# Patient Record
Sex: Female | Born: 1950 | Race: Black or African American | Hispanic: No | Smoking: Never smoker
Health system: Southern US, Community
[De-identification: ages and names within clinical notes are randomized; demographics above are authoritative.]

## PROBLEM LIST (undated history)

## (undated) DIAGNOSIS — I2699 Other pulmonary embolism without acute cor pulmonale: Secondary | ICD-10-CM

## (undated) DIAGNOSIS — K922 Gastrointestinal hemorrhage, unspecified: Secondary | ICD-10-CM

## (undated) DIAGNOSIS — C169 Malignant neoplasm of stomach, unspecified: Secondary | ICD-10-CM

## (undated) DIAGNOSIS — E875 Hyperkalemia: Secondary | ICD-10-CM

## (undated) DIAGNOSIS — I96 Gangrene, not elsewhere classified: Secondary | ICD-10-CM

## (undated) DIAGNOSIS — D649 Anemia, unspecified: Secondary | ICD-10-CM

## (undated) DIAGNOSIS — D696 Thrombocytopenia, unspecified: Secondary | ICD-10-CM

## (undated) DIAGNOSIS — I82409 Acute embolism and thrombosis of unspecified deep veins of unspecified lower extremity: Secondary | ICD-10-CM

## (undated) DIAGNOSIS — E871 Hypo-osmolality and hyponatremia: Secondary | ICD-10-CM

## (undated) HISTORY — DX: Malignant neoplasm of stomach, unspecified: C16.9

## (undated) HISTORY — PX: COLONOSCOPY W/ POLYPECTOMY: SHX1380

## (undated) HISTORY — DX: Anemia, unspecified: D64.9

---

## 2006-10-04 ENCOUNTER — Encounter (INDEPENDENT_AMBULATORY_CARE_PROVIDER_SITE_OTHER): Payer: Self-pay | Admitting: Obstetrics and Gynecology

## 2006-10-04 ENCOUNTER — Ambulatory Visit (HOSPITAL_COMMUNITY): Admission: RE | Admit: 2006-10-04 | Discharge: 2006-10-04 | Payer: Self-pay | Admitting: Obstetrics and Gynecology

## 2008-10-29 ENCOUNTER — Encounter: Payer: Self-pay | Admitting: Gynecologic Oncology

## 2008-10-29 ENCOUNTER — Ambulatory Visit: Admission: RE | Admit: 2008-10-29 | Discharge: 2008-10-29 | Payer: Self-pay | Admitting: Gynecologic Oncology

## 2010-03-20 HISTORY — PX: CERVICAL CONIZATION W/BX: SHX1330

## 2010-06-29 ENCOUNTER — Other Ambulatory Visit: Payer: Self-pay | Admitting: Obstetrics and Gynecology

## 2010-08-02 NOTE — Consult Note (Signed)
NAME:  Jodi Collins, Jodi Collins      ACCOUNT NO.:  [id number]   MEDICAL RECORD NO.:  [id number]          PATIENT TYPE:  OUT   LOCATION:  GYN                          FACILITY:  [facility]   PHYSICIAN:  Laurette Schimke, MD     DATE OF BIRTH:  18-Jul-1950   DATE OF CONSULTATION:  10/29/2008  DATE OF DISCHARGE:                                 CONSULTATION   REQUESTING PHYSICIAN:  Dr. Cherly Hensen.   This is a consult requested from Dr. Cherly Hensen for low-grade SIL.   HISTORY OF PRESENT ILLNESS:  This is a 60 year old nulliparous female  who reported her first abnormal Pap test in 2008 that was treated with  cervical conization and subsequent Paps were all negative until a Pap of  09/29/2008 was reported with low-grade SIL with a comment that there are  rare cells suggestive of higher grade dysplasia.  She denies any post  coital bleeding, changes in bowel, rectal or bladder habits, pelvic  fullness or pelvic pain.   PAST MEDICAL HISTORY:  None.   PAST SURGICAL HISTORY:  Cold knife cone in 2008.   PAST GYN HISTORY:  Nulliparous.  Menarche occurred at the age of 24,  with menses occurring every month until menopause at age 81.  She has  been married for 10 years, is employed as a Librarian, academic.   MEDICATIONS:  Rare use of multivitamins.   SCREENING HISTORY:  Mammogram in March 2010, within normal limits and  colonoscopy at the age of 76 was within normal limits.   PHYSICAL EXAMINATION:  This is a well-developed female in no acute  distress.  ABDOMEN:  Soft and nontender.  LYMPH NODE SURVEY:  Reveals no cervical, inguinal or supraclavicular  adenopathy.  There is normal external genitalia, Bartholin, urethral and Skene.  VAGINA:  Markedly atrophic.  The cervix is flushed with the vaginal fold  and only a dimple can be appreciated.  On colposcopic examination no  lesions were noted within the vagina or the cervical portion.  However,  the vagina was excessively friable.  This was consistent  with atrophic  vaginitis.  The squamocolumnar junction was not visualized and an ECC  was obtained with the use of a cervical brush.  PELVIC EXAMINATION:  There was no parametrial fullness, thickening.  A  small uterus was appreciated.   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  An endocervical curettage was obtained.  If  this is normal there are two options, the first of which is priming the  vagina with estrogen for a series of months and repeating the  Papanicolaou and the colposcopy in addition to collection of human  papilloma virus.  If the Paps continue to return, after vaginal priming,  with evidence of suggestion of dysplasia,  given the small/absent cervix, I do not believe that repeat conization  would be feasible and at that point hysterectomy would need to be  performed.  I have told Ms. Clarke-Campbell that I will contact her with  the results of the endocervical curettage within the next two weeks.      Laurette Schimke, MD  Electronically Signed  WB/MEDQ  D:  10/29/2008  T:  10/29/2008  Job:  [id number]   cc:   Telford Nab, R.N.  [address]  [place], [place] [address]   Maxie Better, M.D.  Fax: [phone]

## 2010-08-02 NOTE — Op Note (Signed)
NAME:  Jodi Collins, Jodi Collins      ACCOUNT NO.:  [id number]   MEDICAL RECORD NO.:  [id number]          PATIENT TYPE:  AMB   LOCATION:  SDC                           FACILITY:  [facility]   PHYSICIAN:  Maxie Better, M.D.DATE OF BIRTH:  24-Oct-1950   DATE OF PROCEDURE:  10/04/2006  DATE OF DISCHARGE:                               OPERATIVE REPORT   PREOPERATIVE DIAGNOSIS:  High-grade squamous intraepithelial lesion.   POSTOPERATIVE DIAGNOSIS:  High-grade squamous intraepithelial lesion  pending final pathology.   OPERATIONS:  Cold knife conization, D and C.   SURGEON:  Maxie Better, M.D.   ANESTHESIA:  General.   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  The  patient now presents for surgical evaluation.  Risks and benefits of the  procedure have been explained.  The patient has been consented and  transferred to the operating room.   DESCRIPTION OF PROCEDURE:  Under adequate general anesthesia, the  patient was placed in the dorsal lithotomy position.  She had been  catheterized for a small amount of urine.  The patient was inadvertently  prepped in the vagina by the surgical tech.  A bivalve speculum was  placed in the vagina.  The cervix was partially flushed to the vaginal  wall.  The vagina was very atrophic.  Stay sutures were placed at 3 and  9 o'clock position using 0 Vicryl figure-of-eight stitches and utilized  for traction on the cervix.  Lugol's solution was placed on the cervix.  Dilute solution of Pitressin was injected circumferentially in the  cervix.  Using a scalpel, a circumferential incision was then made  around the cervical os and the Mayo scissors were then used to complete  removal of a cone shaped specimen.  The specimen  was tagged at the 12  o'clock position.  The uterus sounded to about 6 cm.  The internal os  was deviated to the right.  Careful curetting was then performed.  The  procedure was then continued with fulguration of the base of the cone  biopsy and Surgicel placed in the bed.  Sutures were then tied in the  midline.  All instruments were then removed from the vagina.  Specimen  was cone knife biopsy and endometrial curetting.  Estimated blood loss  is minimal.  Complication was none.  The patient tolerated the procedure  well and was transferred to the recovery room in stable condition.      Maxie Better, M.D.  Electronically Signed     [place]/MEDQ  D:  10/04/2006  T:  10/05/2006  Job:  [id number]

## 2011-01-02 LAB — CBC
MCHC: 32.6
RBC: 4.72
RDW: 14.1 — ABNORMAL HIGH

## 2016-08-10 ENCOUNTER — Other Ambulatory Visit: Payer: Self-pay | Admitting: Internal Medicine

## 2016-08-10 DIAGNOSIS — M722 Plantar fascial fibromatosis: Secondary | ICD-10-CM

## 2016-08-17 ENCOUNTER — Other Ambulatory Visit: Payer: Self-pay | Admitting: Internal Medicine

## 2016-08-17 ENCOUNTER — Ambulatory Visit
Admission: RE | Admit: 2016-08-17 | Discharge: 2016-08-17 | Disposition: A | Discharge status: Home / Self Care | Payer: 59 | Source: Ambulatory Visit | Attending: Internal Medicine | Admitting: Internal Medicine

## 2016-08-17 DIAGNOSIS — I82409 Acute embolism and thrombosis of unspecified deep veins of unspecified lower extremity: Secondary | ICD-10-CM

## 2016-08-17 DIAGNOSIS — M722 Plantar fascial fibromatosis: Secondary | ICD-10-CM

## 2016-08-17 HISTORY — DX: Acute embolism and thrombosis of unspecified deep veins of unspecified lower extremity: I82.409

## 2016-08-28 ENCOUNTER — Encounter (HOSPITAL_COMMUNITY): Payer: Self-pay | Admitting: Emergency Medicine

## 2016-08-28 ENCOUNTER — Emergency Department (HOSPITAL_COMMUNITY): Payer: 59

## 2016-08-28 ENCOUNTER — Inpatient Hospital Stay (HOSPITAL_COMMUNITY)
Admission: EM | Admit: 2016-08-28 | Discharge: 2016-09-01 | DRG: 166 | Disposition: A | Discharge status: Home / Self Care | Payer: 59 | Attending: Internal Medicine | Admitting: Internal Medicine

## 2016-08-28 DIAGNOSIS — N179 Acute kidney failure, unspecified: Secondary | ICD-10-CM | POA: Diagnosis present

## 2016-08-28 DIAGNOSIS — D649 Anemia, unspecified: Secondary | ICD-10-CM | POA: Diagnosis present

## 2016-08-28 DIAGNOSIS — K229 Disease of esophagus, unspecified: Secondary | ICD-10-CM | POA: Diagnosis not present

## 2016-08-28 DIAGNOSIS — D62 Acute posthemorrhagic anemia: Secondary | ICD-10-CM

## 2016-08-28 DIAGNOSIS — I82402 Acute embolism and thrombosis of unspecified deep veins of left lower extremity: Secondary | ICD-10-CM

## 2016-08-28 DIAGNOSIS — K921 Melena: Secondary | ICD-10-CM | POA: Diagnosis present

## 2016-08-28 DIAGNOSIS — Z452 Encounter for adjustment and management of vascular access device: Secondary | ICD-10-CM

## 2016-08-28 DIAGNOSIS — C169 Malignant neoplasm of stomach, unspecified: Secondary | ICD-10-CM | POA: Diagnosis present

## 2016-08-28 DIAGNOSIS — I2692 Saddle embolus of pulmonary artery without acute cor pulmonale: Secondary | ICD-10-CM | POA: Diagnosis not present

## 2016-08-28 DIAGNOSIS — C799 Secondary malignant neoplasm of unspecified site: Secondary | ICD-10-CM

## 2016-08-28 DIAGNOSIS — I2699 Other pulmonary embolism without acute cor pulmonale: Secondary | ICD-10-CM

## 2016-08-28 DIAGNOSIS — I82422 Acute embolism and thrombosis of left iliac vein: Secondary | ICD-10-CM

## 2016-08-28 DIAGNOSIS — C762 Malignant neoplasm of abdomen: Secondary | ICD-10-CM | POA: Diagnosis not present

## 2016-08-28 DIAGNOSIS — R0902 Hypoxemia: Secondary | ICD-10-CM | POA: Diagnosis not present

## 2016-08-28 DIAGNOSIS — K922 Gastrointestinal hemorrhage, unspecified: Secondary | ICD-10-CM | POA: Diagnosis not present

## 2016-08-28 DIAGNOSIS — R1900 Intra-abdominal and pelvic swelling, mass and lump, unspecified site: Secondary | ICD-10-CM | POA: Diagnosis not present

## 2016-08-28 DIAGNOSIS — J9601 Acute respiratory failure with hypoxia: Secondary | ICD-10-CM | POA: Diagnosis present

## 2016-08-28 DIAGNOSIS — K449 Diaphragmatic hernia without obstruction or gangrene: Secondary | ICD-10-CM | POA: Diagnosis present

## 2016-08-28 DIAGNOSIS — C786 Secondary malignant neoplasm of retroperitoneum and peritoneum: Secondary | ICD-10-CM | POA: Diagnosis not present

## 2016-08-28 DIAGNOSIS — R0602 Shortness of breath: Secondary | ICD-10-CM

## 2016-08-28 DIAGNOSIS — C7982 Secondary malignant neoplasm of genital organs: Secondary | ICD-10-CM | POA: Diagnosis not present

## 2016-08-28 DIAGNOSIS — R000 Tachycardia, unspecified: Secondary | ICD-10-CM

## 2016-08-28 DIAGNOSIS — O2230 Deep phlebothrombosis in pregnancy, unspecified trimester: Secondary | ICD-10-CM

## 2016-08-28 DIAGNOSIS — D72829 Elevated white blood cell count, unspecified: Secondary | ICD-10-CM

## 2016-08-28 HISTORY — DX: Other pulmonary embolism without acute cor pulmonale: I26.99

## 2016-08-28 HISTORY — DX: Acute embolism and thrombosis of unspecified deep veins of unspecified lower extremity: I82.409

## 2016-08-28 LAB — RETICULOCYTES
RBC.: 1.79 MIL/uL — ABNORMAL LOW (ref 3.87–5.11)
Retic Count, Absolute: 230.9 10*3/uL — ABNORMAL HIGH (ref 19.0–186.0)
Retic Ct Pct: 12.9 % — ABNORMAL HIGH (ref 0.4–3.1)

## 2016-08-28 LAB — COMPREHENSIVE METABOLIC PANEL
ALBUMIN: 2.6 g/dL — AB (ref 3.5–5.0)
ALK PHOS: 77 U/L (ref 38–126)
ALT: 32 U/L (ref 14–54)
ANION GAP: 16 — AB (ref 5–15)
AST: 54 U/L — ABNORMAL HIGH (ref 15–41)
BUN: 21 mg/dL — ABNORMAL HIGH (ref 6–20)
CHLORIDE: 106 mmol/L (ref 101–111)
CO2: 16 mmol/L — AB (ref 22–32)
CREATININE: 1.14 mg/dL — AB (ref 0.44–1.00)
Calcium: 8.3 mg/dL — ABNORMAL LOW (ref 8.9–10.3)
GFR calc non Af Amer: 50 mL/min — ABNORMAL LOW (ref >60)
GFR, EST AFRICAN AMERICAN: 58 mL/min — AB (ref >60)
GLUCOSE: 148 mg/dL — AB (ref 65–99)
Potassium: 4.4 mmol/L (ref 3.5–5.1)
SODIUM: 138 mmol/L (ref 135–145)
Total Bilirubin: 0.6 mg/dL (ref 0.3–1.2)
Total Protein: 7.1 g/dL (ref 6.5–8.1)

## 2016-08-28 LAB — CBC WITH DIFFERENTIAL/PLATELET
Basophils Absolute: 0 10*3/uL (ref 0.0–0.1)
Basophils Relative: 0 %
Eosinophils Absolute: 0 10*3/uL (ref 0.0–0.7)
Eosinophils Relative: 0 %
HEMATOCRIT: 15.5 % — AB (ref 36.0–46.0)
HEMOGLOBIN: 4.4 g/dL — AB (ref 12.0–15.0)
LYMPHS ABS: 1.6 10*3/uL (ref 0.7–4.0)
Lymphocytes Relative: 12 %
MCH: 23.9 pg — AB (ref 26.0–34.0)
MCHC: 28.4 g/dL — ABNORMAL LOW (ref 30.0–36.0)
MCV: 84.2 fL (ref 78.0–100.0)
MONO ABS: 0.4 10*3/uL (ref 0.1–1.0)
Monocytes Relative: 3 %
NEUTROS ABS: 11.1 10*3/uL — AB (ref 1.7–7.7)
NEUTROS PCT: 84 %
Platelets: 180 10*3/uL (ref 150–400)
RBC: 1.84 MIL/uL — ABNORMAL LOW (ref 3.87–5.11)
RDW: 21.8 % — AB (ref 11.5–15.5)
WBC: 13.2 10*3/uL — ABNORMAL HIGH (ref 4.0–10.5)

## 2016-08-28 LAB — IRON AND TIBC
IRON: 16 ug/dL — AB (ref 28–170)
Saturation Ratios: 6 % — ABNORMAL LOW (ref 10.4–31.8)
TIBC: 262 ug/dL (ref 250–450)
UIBC: 246 ug/dL

## 2016-08-28 LAB — BRAIN NATRIURETIC PEPTIDE: B Natriuretic Peptide: 347 pg/mL — ABNORMAL HIGH (ref 0.0–100.0)

## 2016-08-28 LAB — HEPARIN LEVEL (UNFRACTIONATED): Heparin Unfractionated: <0.1 IU/mL — ABNORMAL LOW (ref 0.30–0.70)

## 2016-08-28 LAB — PREPARE RBC (CROSSMATCH)

## 2016-08-28 LAB — VITAMIN B12: VITAMIN B 12: 5758 pg/mL — AB (ref 180–914)

## 2016-08-28 LAB — FOLATE: FOLATE: 18.4 ng/mL (ref >5.9)

## 2016-08-28 LAB — ABO/RH: ABO/RH(D): A POS

## 2016-08-28 LAB — PROTIME-INR
INR: 1.5
Prothrombin Time: 18.3 seconds — ABNORMAL HIGH (ref 11.4–15.2)

## 2016-08-28 LAB — FERRITIN: Ferritin: 26 ng/mL (ref 11–307)

## 2016-08-28 LAB — APTT: APTT: 28 s (ref 24–36)

## 2016-08-28 LAB — TROPONIN I: Troponin I: 0.22 ng/mL (ref <0.03)

## 2016-08-28 MED ORDER — IOPAMIDOL (ISOVUE-370) INJECTION 76%
100.0000 mL | Freq: Once | INTRAVENOUS | Status: AC | PRN
  Administered 2016-08-28: 100 mL via INTRAVENOUS

## 2016-08-28 MED ORDER — ASPIRIN 300 MG RE SUPP: 300.0000 mg | RECTAL | Status: DC

## 2016-08-28 MED ORDER — ASPIRIN 81 MG PO CHEW: 324.0000 mg | CHEWABLE_TABLET | ORAL | Status: DC

## 2016-08-28 MED ORDER — SODIUM CHLORIDE 0.9 % IV SOLN: 250.0000 mL | INTRAVENOUS | Status: DC | PRN

## 2016-08-28 MED ORDER — PANTOPRAZOLE SODIUM 40 MG IV SOLR
40.0000 mg | INTRAVENOUS | Status: DC
  Administered 2016-08-29 – 2016-09-01 (×4): 40 mg via INTRAVENOUS
  Filled 2016-08-28 (×4): qty 40

## 2016-08-28 MED ORDER — HEPARIN BOLUS VIA INFUSION: 3500.0000 [IU] | Freq: Once | INTRAVENOUS | Status: DC

## 2016-08-28 MED ORDER — SODIUM CHLORIDE 0.9 % IV SOLN
Freq: Once | INTRAVENOUS | Status: AC
  Administered 2016-08-28: 10 mL/h via INTRAVENOUS

## 2016-08-28 MED ORDER — HEPARIN (PORCINE) IN NACL 100-0.45 UNIT/ML-% IJ SOLN: 1000.0000 [IU]/h | INTRAMUSCULAR | Status: DC

## 2016-08-28 NOTE — Progress Notes (Signed)
ANTICOAGULATION CONSULT NOTE - Initial Consult  Pharmacy Consult for Heparin Indication: pulmonary embolus  No Known Allergies  Patient Measurements: Height: 5\' 2"  (157.5 cm) Weight: 130 lb (59 kg) IBW/kg (Calculated) : 50.1 HEPARIN DW (KG): 59  Vital Signs: Temp: 98 F (36.7 C) (06/11 1459) Temp Source: Oral (06/11 1459) BP: 134/79 (06/11 1630) Pulse Rate: 105 (06/11 1615)  Labs:  Recent Labs  08/28/16 1547  LABPROT 18.3*  INR 1.50  CREATININE 1.14*  TROPONINI 0.22*    Estimated Creatinine Clearance: 39.4 mL/min (A) (by C-G formula based on SCr of 1.14 mg/dL (H)).   Medical History: Past Medical History:  Diagnosis Date  . DVT (deep venous thrombosis) (HCC)     Medications:  See med rec  Assessment: 66 yo female presents to ED with SOB. It started a couple of days ago and is gradually worsening. She also complains of a cough, leg pain, and leg swelling. She does have a DVT that was diagnosed 2 weeks ago, but stopped the xarelto last Friday. Likely patient is presenting with a PE, plan to start heparin.  Goal of Therapy:  Heparin level 0.3-0.7 units/ml Monitor platelets by anticoagulation protocol: Yes   Plan:  Give 3500 units bolus x 1 Start heparin infusion at 1000 units/hr Check anti-Xa level in 6 hours and daily while on heparin Continue to monitor H&H and platelets  Trenton Gammon, Damika Harmon L 08/28/2016,4:41 PM

## 2016-08-28 NOTE — ED Triage Notes (Signed)
PT states she was dx with DVT to LLE x1 week ago. PT states SOB on exertion since she began taking Xarelto medication. PT denies any pain at this time.

## 2016-08-28 NOTE — ED Notes (Signed)
Pt is in CT

## 2016-08-28 NOTE — H&P (Signed)
PULMONARY / CRITICAL CARE MEDICINE   Name: Jodi Collins MRN: [id number] DOB: 05-Mar-1951    ADMISSION DATE:  08/28/2016 CONSULTATION DATE:  08/28/2016  REFERRING MD:  Dr. Dayna Barker EDP  CHIEF COMPLAINT:  Dyspnea  HISTORY OF PRESENT ILLNESS:   66 year old female with PMH as below, which is significant for recent dx left DVT started on Xarelto after a long flight. She presented to [facility] ED 6/11 with complaints of SOB x several days with associated cough, leg pain, and leg swelling. Upon arrival to the ED she was markedly dyspneic despite the application of 4L O2 via [place]. There was obviously concern for PE and she was started on heparin. CTA did demonstrate R sided pulmonary emboli, however, lab work resulted and was significant for hemoglobin of 4.4. Heparin infusion was stopped and she was set up for blood transfusion. Other significant lab workup includes CO2 16, creatinine 1.14, BMP 347, Troponin 0.22, WBC 13.2,  plt 180, INR 1.5. 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 She was transferred to [facility] for ICU admission. PCCM to admit.   Reports Stop Taking Xarelto after one week due to increase in dyspnea. Additionally, reports that for about the last week bowel movements have been almost black. States for the last few days has been dizzy and felt weak.   PAST MEDICAL HISTORY :  She  has a past medical history of DVT (deep venous thrombosis) ([place]).  PAST SURGICAL HISTORY: She  has no past surgical history on file.  No Known Allergies  No current facility-administered medications on file prior to encounter.    No current outpatient prescriptions on file prior to encounter.    FAMILY HISTORY:  Her has no family status information on file.    SOCIAL HISTORY: She  reports that she has never smoked. She has never used smokeless tobacco. She reports that she drinks alcohol. She reports that she  does not use drugs.  REVIEW OF SYSTEMS:   All negative; except for those that are bolded, which indicate positives.  Constitutional: weight loss, weight gain, night sweats, fevers, chills, fatigue, weakness.  HEENT: headaches, sore throat, sneezing, nasal congestion, post nasal drip, difficulty swallowing, tooth/dental problems, visual complaints, visual changes, ear aches. Neuro: difficulty with speech, weakness, numbness, ataxia. CV:  chest pain, orthopnea, PND, swelling in lower extremities, dizziness, palpitations, syncope.  Resp: cough, hemoptysis, dyspnea, wheezing. GI: heartburn, indigestion, abdominal pain, nausea, vomiting, diarrhea, constipation, change in bowel habits, loss of appetite, hematemesis, melena, hematochezia.  GU: dysuria, change in color of urine, urgency or frequency, flank pain, hematuria. MSK: joint pain or swelling, decreased range of motion. Psych: change in mood or affect, depression, anxiety, suicidal ideations, homicidal ideations. Skin: rash, itching, bruising.   SUBJECTIVE:  Denies pain, reports improvement in dyspnea   VITAL SIGNS: BP 94/66   Pulse (!) 116   Temp 98 F (36.7 C) (Oral)   Resp 19   Ht 5\' 2"  (1.575 m)   Wt 59 kg (130 lb)   SpO2 96%   BMI 23.78 kg/m   HEMODYNAMICS:    VENTILATOR SETTINGS:    INTAKE / OUTPUT: No intake/output data recorded.  PHYSICAL EXAMINATION: General:  Adult female, no distress  Neuro:  Alert, oriented, follows commands  HEENT:  Dry mucus membranes  Cardiovascular:  Tachy, no MRG  Lungs:  Clear breath sounds, no crackles/wheeze  Abdomen:  Soft, non-tender, active bowel sounds  Musculoskeletal:  -edema  Skin:  Warm, dry, intact   LABS:  BMET  Recent Labs Lab 08/28/16 1547  NA 138  K 4.4  CL 106  CO2 16*  BUN 21*  CREATININE 1.14*  GLUCOSE 148*    Electrolytes  Recent Labs Lab 08/28/16 1547  CALCIUM 8.3*    CBC  Recent Labs Lab 08/28/16 1547  WBC 13.2*  HGB 4.4*  HCT  15.5*  PLT 180    Coag's  Recent Labs Lab 08/28/16 1547 08/28/16 1642  APTT  --  28  INR 1.50  --     Sepsis Markers No results for input(s): LATICACIDVEN, PROCALCITON, O2SATVEN in the last 168 hours.  ABG No results for input(s): PHART, PCO2ART, PO2ART in the last 168 hours.  Liver Enzymes  Recent Labs Lab 08/28/16 1547  AST 54*  ALT 32  ALKPHOS 77  BILITOT 0.6  ALBUMIN 2.6*    Cardiac Enzymes  Recent Labs Lab 08/28/16 1547  TROPONINI 0.22*    Glucose No results for input(s): GLUCAP in the last 168 hours.  Imaging Ct Angio Chest W/cm &/or Wo Cm  Result Date: 08/28/2016 CLINICAL DATA:  66 year old female with a history of shortness of breath EXAM: CT ANGIOGRAPHY CHEST CT ABDOMEN AND PELVIS WITH CONTRAST TECHNIQUE: Multidetector CT imaging of the chest was performed using the standard protocol during bolus administration of intravenous contrast. Multiplanar CT image reconstructions and MIPs were obtained to evaluate the vascular anatomy. Multidetector CT imaging of the abdomen and pelvis was performed using the standard protocol during bolus administration of intravenous contrast. CONTRAST:  100 cc Isovue 370 COMPARISON:  None. FINDINGS: CTA CHEST FINDINGS Cardiovascular: Heart: No cardiomegaly. No pericardial fluid/thickening. No significant coronary calcifications. Right and left ventricles are somewhat compressed by the large left-sided diaphragmatic herniation. Estimated diameter of the left ventricle measures 20 mm with estimated diameter of the right ventricle measuring 43 mm. Aorta: Unremarkable course, caliber, contour of the thoracic aorta. No aneurysm or dissection flap. No periaortic fluid. Pulmonary arteries: Filling defects of the right-sided pulmonary arteries involving the segmental branches of the right lower lobe, right middle lobe, right upper lobe. No filling defects within left-sided pulmonary arteries identified. Mediastinum/Nodes: Mediastinal  structures somewhat compressed by the diaphragmatic hernia. Lymph nodes are present within the mediastinum, with the lowest peritracheal lymph nodes borderline enlarged. Lungs/Pleura: Right lung with no pleural effusion or confluent airspace disease. Atelectatic changes at the base of the left lung.  No pneumothorax. Small nodule within the right upper lobe (image 30 of series 6). Musculoskeletal: No displaced fracture. Degenerative changes of the spine. Review of the MIP images confirms the above findings. CT ABDOMEN and PELVIS FINDINGS Hepatobiliary: Rounded hypodense lesion of the inferior right liver measures 7 mm (image 22 of series 12) and 10 mm (image 26 of series 12). Unremarkable appearance the gallbladder. Pancreas: Unremarkable appearance of the pancreas. Spleen: Unremarkable appearance of spleen Adrenals/Urinary Tract: Unremarkable appearance the bilateral adrenal glands. No evidence of left-sided or right-sided hydronephrosis. No nephrolithiasis. No perinephric stranding. On the right there is an ill-defined hypodense/ hypoenhancing region at the posterior/ lateral cortex of the kidney measuring 10 mm. Stomach/Bowel: Herniation of the majority of the stomach into the left chest through diaphragmatic hernia. The splenic flexure is herniated into the left chest. No evidence of abnormally distended small bowel or colon. Vascular/Lymphatic: Extensive nodularity of the mesenteric fat associated with the stomach, splenic flexure, and the mesenteric fat herniated into the left chest. Nodular tissue associated with the  vasculature. There is circumferential soft tissue surrounding the left gastric artery after the origin from the celiac artery soft tissue essentially in cases the gastric artery just after the origin. Hazy infiltration of the mesenteric fat within the left upper quadrant. Enhancing peritoneal surfaces in the low abdomen/pelvis, involving nearly all the visualized at adnexa and the mesenteric fat  associated with small bowel. Small volume ascites in the abdomen/pelvis. Unremarkable course caliber and contour of the abdominal aorta. No significant atherosclerotic changes. Proximal femoral vasculature patent. Nonocclusive venous thrombus within the left iliac system at the confluence of internal iliac vein in the external iliac vein. Proximal femoral veins unremarkable. Reproductive: Enhancing soft tissue nodularity associated with the right and left adnexa, with the surfaces of the broad ligaments in case with enhancing soft tissue. Other: Enhancing soft tissue within the umbilical region, likely pathologic lymph node. Musculoskeletal: No displaced fracture. No significant degenerative changes of the hips. Review of the MIP images confirms the above findings. IMPRESSION: The study is positive for right-sided pulmonary emboli involving segmental and subsegmental branches. Assessing for heart strain is limited given the distortion secondary to left-sided diaphragmatic hernia. If there is concern for right-sided heart strain, correlation with cardiac ECHO may be useful. Left-sided diaphragmatic hernia, involving majority of the stomach, splenic flexure, and large portion of the associated mesentery. 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 Alternative site of a presumed malignancy of the left upper quadrant could be the splenic flexure. Correlation with endoscopy and possibly colonoscopy recommended. 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 Enhancing lesion within the umbilical region, likely metastatic involvement of lymph node. Small hypodense lesions of the right liver, too small  to characterize and possibly benign cysts. Correlation with any future PET-CT may be useful. Low-density lesion of the lateral cortex of the right kidney. Differential diagnosis includes kidney infarction, infection, or tumor implant. Small nodule of the right upper lobe measuring 4 mm. Correlation with future imaging recommended. Nonocclusive left iliac DVT. These results were called by telephone at the time of interpretation on 08/28/2016 at 5:52 pm to Dr. Merrily Pew , who verbally acknowledged these results. Signed, Dulcy Fanny. Earleen Newport, DO [facility] Radiology Electronically Signed   By: Corrie Mckusick D.O.   On: 08/28/2016 17:57   Ct Abdomen Pelvis W Contrast  Result Date: 08/28/2016 CLINICAL DATA:  66 year old female with a history of shortness of breath EXAM: CT ANGIOGRAPHY CHEST CT ABDOMEN AND PELVIS WITH CONTRAST TECHNIQUE: Multidetector CT imaging of the chest was performed using the standard protocol during bolus administration of intravenous contrast. Multiplanar CT image reconstructions and MIPs were obtained to evaluate the vascular anatomy. Multidetector CT imaging of the abdomen and pelvis was performed using the standard protocol during bolus administration of intravenous contrast. CONTRAST:  100 cc Isovue 370 COMPARISON:  None. FINDINGS: CTA CHEST FINDINGS Cardiovascular: Heart: No cardiomegaly. No pericardial fluid/thickening. No significant coronary calcifications. Right and left ventricles are somewhat compressed by the large left-sided diaphragmatic herniation. Estimated diameter of the left ventricle measures 20 mm with estimated diameter of the right ventricle measuring 43 mm. Aorta: Unremarkable course, caliber, contour of the thoracic aorta. No aneurysm or dissection flap. No periaortic fluid. Pulmonary arteries: Filling defects of the  right-sided pulmonary arteries involving the segmental branches of the right lower lobe, right middle lobe,  right upper lobe. No filling defects within left-sided pulmonary arteries identified. Mediastinum/Nodes: Mediastinal structures somewhat compressed by the diaphragmatic hernia. Lymph nodes are present within the mediastinum, with the lowest peritracheal lymph nodes borderline enlarged. Lungs/Pleura: Right lung with no pleural effusion or confluent airspace disease. Atelectatic changes at the base of the left lung.  No pneumothorax. Small nodule within the right upper lobe (image 30 of series 6). Musculoskeletal: No displaced fracture. Degenerative changes of the spine. Review of the MIP images confirms the above findings. CT ABDOMEN and PELVIS FINDINGS Hepatobiliary: Rounded hypodense lesion of the inferior right liver measures 7 mm (image 22 of series 12) and 10 mm (image 26 of series 12). Unremarkable appearance the gallbladder. Pancreas: Unremarkable appearance of the pancreas. Spleen: Unremarkable appearance of spleen Adrenals/Urinary Tract: Unremarkable appearance the bilateral adrenal glands. No evidence of left-sided or right-sided hydronephrosis. No nephrolithiasis. No perinephric stranding. On the right there is an ill-defined hypodense/ hypoenhancing region at the posterior/ lateral cortex of the kidney measuring 10 mm. Stomach/Bowel: Herniation of the majority of the stomach into the left chest through diaphragmatic hernia. The splenic flexure is herniated into the left chest. No evidence of abnormally distended small bowel or colon. Vascular/Lymphatic: Extensive nodularity of the mesenteric fat associated with the stomach, splenic flexure, and the mesenteric fat herniated into the left chest. Nodular tissue associated with the vasculature. There is circumferential soft tissue surrounding the left gastric artery after the origin from the celiac artery soft tissue essentially in cases the gastric artery just after the origin. Hazy infiltration of the mesenteric fat within the left upper quadrant. Enhancing  peritoneal surfaces in the low abdomen/pelvis, involving nearly all the visualized at adnexa and the mesenteric fat associated with small bowel. Small volume ascites in the abdomen/pelvis. Unremarkable course caliber and contour of the abdominal aorta. No significant atherosclerotic changes. Proximal femoral vasculature patent. Nonocclusive venous thrombus within the left iliac system at the confluence of internal iliac vein in the external iliac vein. Proximal femoral veins unremarkable. Reproductive: Enhancing soft tissue nodularity associated with the right and left adnexa, with the surfaces of the broad ligaments in case with enhancing soft tissue. Other: Enhancing soft tissue within the umbilical region, likely pathologic lymph node. Musculoskeletal: No displaced fracture. No significant degenerative changes of the hips. Review of the MIP images confirms the above findings. IMPRESSION: The study is positive for right-sided pulmonary emboli involving segmental and subsegmental branches. Assessing for heart strain is limited given the distortion secondary to left-sided diaphragmatic hernia. If there is concern for right-sided heart strain, correlation with cardiac ECHO may be useful. Left-sided diaphragmatic hernia, involving majority of the stomach, splenic flexure, and large portion of the associated mesentery. 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 Alternative site of a presumed malignancy of the left upper quadrant could be the splenic flexure. Correlation with endoscopy and possibly colonoscopy recommended. 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 Enhancing lesion within the  umbilical region, likely metastatic involvement of lymph node. Small hypodense lesions of the right liver, too small to characterize and possibly benign cysts. Correlation with any future PET-CT may be useful. Low-density lesion of the  lateral cortex of the right kidney. Differential diagnosis includes kidney infarction, infection, or tumor implant. Small nodule of the right upper lobe measuring 4 mm. Correlation with future imaging recommended. Nonocclusive left iliac DVT. These results were called by telephone at the time of interpretation on 08/28/2016 at 5:52 pm to Dr. Merrily Pew , who verbally acknowledged these results. Signed, Dulcy Fanny. Earleen Newport, DO [facility] Radiology Electronically Signed   By: Corrie Mckusick D.O.   On: 08/28/2016 17:57     STUDIES:  CTA chest and abdomen 6/11 > Right sided pulmonary emboli involving segmental and sub-segmental branches. Could not assess for heart strain due to large diaphragmatic hernia. 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 Enhancing lesion within the umbilical region, likely metastatic involvement of lymph node.  CULTURES: None  ANTIBIOTICS: None  SIGNIFICANT EVENTS: 6/11 > Presents to ED with Dyspnea   LINES/TUBES: PIV  DISCUSSION: 66 year old female with diagnosis of DVT in last 2 weeks (started on xarelto last Friday), reports not taking but for one week. 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 Suspect lactic in origin Gentle volume, need blood products for resuscitation, not crystalloid. Trend BMP   GASTROINTESTINAL A:   Large diaphragmatic hernia ?GI Bleed > reports dark stool over the last week  P:   NPO for now Protonix for SUP Hemoccult when able GI consult   HEMATOLOGIC A:   Acute blood loss anemia  Pulmonary emboli Residual DVT L iliac on CTA ?Mets (CTA reveals extensive carcinomatosis) P:  Transfusing 2 units per ED orders Serial CBC FOBT Unable to anticoagulate IR Consult for IVC filter likely   Holding home xarelto  INFECTIOUS A:   Leukocytosis P:   Trend WBC and Fever Curve   ENDOCRINE A:   Hyperglycemia    P:   Trend Glucose   NEUROLOGIC A:   No issues  P:   Monitor   FAMILY  - Updates: Patient updated at bedside, no family present   - Inter-disciplinary family meet or Palliative Care meeting due by:  6/18  CC Time: 22 minutes   Hayden Pedro, AGACNP-BC [place] [facility]  Pgr: [phone]  PCCM Pgr: [phone]

## 2016-08-28 NOTE — ED Provider Notes (Signed)
[facility] DEPT Provider Note   CSN: [id number] Arrival date & time: 08/28/16  1452     History   Chief Complaint Chief Complaint  Patient presents with  . Shortness of Breath    HPI Jodi Collins is a 66 y.o. female.   Shortness of Breath  This is a new problem. The average episode lasts 1 week. The problem occurs continuously.The current episode started more than 2 days ago. The problem has been gradually worsening. Associated symptoms include cough, leg pain and leg swelling. Pertinent negatives include no fever, no chest pain and no syncope. The problem's precipitants include exercise. Risk factors: known left DVT. She has tried nothing for the symptoms. She has had no prior hospitalizations. She has had no prior ED visits. She has had no prior ICU admissions. Associated medical issues include DVT.    Past Medical History:  Diagnosis Date  . DVT (deep venous thrombosis) [facility])     Patient Active Problem List   Diagnosis Date Noted  . Pulmonary emboli ([place]) 08/28/2016  . Anemia 08/28/2016    History reviewed. No pertinent surgical history.  OB History    Gravida Para Term Preterm AB Living   0 0 0 0 0 0   SAB TAB Ectopic Multiple Live Births   0 0 0 0 0       Home Medications    Prior to Admission medications   Medication Sig Start Date End Date Taking? Authorizing Provider  Calcium Citrate-Vitamin D (CALCIUM + D PO) Take 1 tablet by mouth daily.   Yes [provider]  ibuprofen (ADVIL,MOTRIN) 200 MG tablet Take 200 mg by mouth every 6 (six) hours as needed for mild pain or moderate pain.   Yes [provider]  Multiple Vitamin (MULTIVITAMIN WITH MINERALS) TABS tablet Take 1 tablet by mouth daily.   Yes [provider]    Family History History reviewed. No pertinent family history.  Social History Social History  Substance Use Topics  . Smoking status: Never Smoker  . Smokeless tobacco: Never Used  . Alcohol use  Yes     Comment: rarely     Allergies   Patient has no known allergies.   Review of Systems Review of Systems  Constitutional: Negative for fever.  Respiratory: Positive for cough and shortness of breath.   Cardiovascular: Positive for leg swelling. Negative for chest pain and syncope.  All other systems reviewed and are negative.    Physical Exam Updated Vital Signs BP 131/69   Pulse (!) 101   Temp 98 F (36.7 C) (Oral)   Resp (!) 25   Ht 5\' 2"  (1.575 m)   Wt 62.6 kg (138 lb)   SpO2 100%   BMI 25.24 kg/m   Physical Exam  Constitutional: She is oriented to person, place, and time. She appears well-developed and well-nourished.  HENT:  Head: Normocephalic and atraumatic.  Eyes: Conjunctivae and EOM are normal.  Neck: Normal range of motion.  Cardiovascular: Regular rhythm.  Tachycardia present.   Pulmonary/Chest: Breath sounds normal. Accessory muscle usage present. No stridor. Tachypnea noted. She is in respiratory distress. She has no wheezes.  Abdominal: Soft. She exhibits no distension.  Musculoskeletal: She exhibits edema (L>R).  Neurological: She is alert and oriented to person, place, and time. No cranial nerve deficit. Coordination normal.  Skin: Skin is warm and dry.  Nursing note and vitals reviewed.    ED Treatments / Results  Labs (all labs ordered are listed, but only  abnormal results are displayed) Labs Reviewed  COMPREHENSIVE METABOLIC PANEL - Abnormal; Notable for the following:       Result Value   CO2 16 (*)    Glucose, Bld 148 (*)    BUN 21 (*)    Creatinine, Ser 1.14 (*)    Calcium 8.3 (*)    Albumin 2.6 (*)    AST 54 (*)    GFR calc non Af Amer 50 (*)    GFR calc Af Amer 58 (*)    Anion gap 16 (*)    All other components within normal limits  CBC WITH DIFFERENTIAL/PLATELET - Abnormal; Notable for the following:    WBC 13.2 (*)    RBC 1.84 (*)    Hemoglobin 4.4 (*)    HCT 15.5 (*)    MCH 23.9 (*)    MCHC 28.4 (*)    RDW 21.8  (*)    Neutro Abs 11.1 (*)    All other components within normal limits  BRAIN NATRIURETIC PEPTIDE - Abnormal; Notable for the following:    B Natriuretic Peptide 347.0 (*)    All other components within normal limits  TROPONIN I - Abnormal; Notable for the following:    Troponin I 0.22 (*)    All other components within normal limits  PROTIME-INR - Abnormal; Notable for the following:    Prothrombin Time 18.3 (*)    All other components within normal limits  VITAMIN B12 - Abnormal; Notable for the following:    Vitamin B-12 5,758 (*)    All other components within normal limits  IRON AND TIBC - Abnormal; Notable for the following:    Iron 16 (*)    Saturation Ratios 6 (*)    All other components within normal limits  RETICULOCYTES - Abnormal; Notable for the following:    Retic Ct Pct 12.9 (*)    RBC. 1.79 (*)    Retic Count, Manual 230.9 (*)    All other components within normal limits  HEPARIN LEVEL (UNFRACTIONATED) - Abnormal; Notable for the following:    Heparin Unfractionated <0.10 (*)    All other components within normal limits  MRSA PCR SCREENING  FOLATE  FERRITIN  APTT  OCCULT BLOOD X 1 CARD TO LAB, STOOL  LACTIC ACID, PLASMA  URINALYSIS, ROUTINE W REFLEX MICROSCOPIC  CBC  CBC  CBC  MAGNESIUM  PHOSPHORUS  TYPE AND SCREEN  PREPARE RBC (CROSSMATCH)  ABO/RH    EKG  EKG Interpretation  Date/Time:  Monday August 28 2016 16:14:51 EDT Ventricular Rate:  113 PR Interval:    QRS Duration: 89 QT Interval:  334 QTC Calculation: 458 R Axis:   56 Text Interpretation:  Sinus tachycardia No old tracing to compare Confirmed by Merrily Pew [phone]) on 08/28/2016 4:17:50 PM       Radiology Ct Angio Chest W/cm &/or Wo Cm  Result Date: 08/28/2016 CLINICAL DATA:  66 year old female with a history of shortness of breath EXAM: CT ANGIOGRAPHY CHEST CT ABDOMEN AND PELVIS WITH CONTRAST TECHNIQUE: Multidetector CT imaging of the chest was performed using the standard  protocol during bolus administration of intravenous contrast. Multiplanar CT image reconstructions and MIPs were obtained to evaluate the vascular anatomy. Multidetector CT imaging of the abdomen and pelvis was performed using the standard protocol during bolus administration of intravenous contrast. CONTRAST:  100 cc Isovue 370 COMPARISON:  None. FINDINGS: CTA CHEST FINDINGS Cardiovascular: Heart: No cardiomegaly. No pericardial fluid/thickening. No significant coronary calcifications. Right and left ventricles are somewhat  compressed by the large left-sided diaphragmatic herniation. Estimated diameter of the left ventricle measures 20 mm with estimated diameter of the right ventricle measuring 43 mm. Aorta: Unremarkable course, caliber, contour of the thoracic aorta. No aneurysm or dissection flap. No periaortic fluid. Pulmonary arteries: Filling defects of the right-sided pulmonary arteries involving the segmental branches of the right lower lobe, right middle lobe, right upper lobe. No filling defects within left-sided pulmonary arteries identified. Mediastinum/Nodes: Mediastinal structures somewhat compressed by the diaphragmatic hernia. Lymph nodes are present within the mediastinum, with the lowest peritracheal lymph nodes borderline enlarged. Lungs/Pleura: Right lung with no pleural effusion or confluent airspace disease. Atelectatic changes at the base of the left lung.  No pneumothorax. Small nodule within the right upper lobe (image 30 of series 6). Musculoskeletal: No displaced fracture. Degenerative changes of the spine. Review of the MIP images confirms the above findings. CT ABDOMEN and PELVIS FINDINGS Hepatobiliary: Rounded hypodense lesion of the inferior right liver measures 7 mm (image 22 of series 12) and 10 mm (image 26 of series 12). Unremarkable appearance the gallbladder. Pancreas: Unremarkable appearance of the pancreas. Spleen: Unremarkable appearance of spleen Adrenals/Urinary Tract:  Unremarkable appearance the bilateral adrenal glands. No evidence of left-sided or right-sided hydronephrosis. No nephrolithiasis. No perinephric stranding. On the right there is an ill-defined hypodense/ hypoenhancing region at the posterior/ lateral cortex of the kidney measuring 10 mm. Stomach/Bowel: Herniation of the majority of the stomach into the left chest through diaphragmatic hernia. The splenic flexure is herniated into the left chest. No evidence of abnormally distended small bowel or colon. Vascular/Lymphatic: Extensive nodularity of the mesenteric fat associated with the stomach, splenic flexure, and the mesenteric fat herniated into the left chest. Nodular tissue associated with the vasculature. There is circumferential soft tissue surrounding the left gastric artery after the origin from the celiac artery soft tissue essentially in cases the gastric artery just after the origin. Hazy infiltration of the mesenteric fat within the left upper quadrant. Enhancing peritoneal surfaces in the low abdomen/pelvis, involving nearly all the visualized at adnexa and the mesenteric fat associated with small bowel. Small volume ascites in the abdomen/pelvis. Unremarkable course caliber and contour of the abdominal aorta. No significant atherosclerotic changes. Proximal femoral vasculature patent. Nonocclusive venous thrombus within the left iliac system at the confluence of internal iliac vein in the external iliac vein. Proximal femoral veins unremarkable. Reproductive: Enhancing soft tissue nodularity associated with the right and left adnexa, with the surfaces of the broad ligaments in case with enhancing soft tissue. Other: Enhancing soft tissue within the umbilical region, likely pathologic lymph node. Musculoskeletal: No displaced fracture. No significant degenerative changes of the hips. Review of the MIP images confirms the above findings. IMPRESSION: The study is positive for right-sided pulmonary emboli  involving segmental and subsegmental branches. Assessing for heart strain is limited given the distortion secondary to left-sided diaphragmatic hernia. If there is concern for right-sided heart strain, correlation with cardiac ECHO may be useful. Left-sided diaphragmatic hernia, involving majority of the stomach, splenic flexure, and large portion of the associated mesentery. 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 Alternative site of a presumed malignancy of the left upper quadrant could be the splenic flexure. Correlation with endoscopy and possibly colonoscopy recommended. 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 Enhancing lesion within the umbilical region, likely metastatic involvement of lymph node. Small hypodense lesions of the right liver, too small to characterize and possibly benign cysts. Correlation with any future PET-CT may be useful. Low-density lesion of the lateral cortex of the right kidney. Differential diagnosis includes kidney infarction, infection, or tumor implant. Small nodule of the right upper lobe measuring 4 mm. Correlation with future imaging recommended. Nonocclusive left iliac DVT. These results were called by telephone at the time of interpretation on 08/28/2016 at 5:52 pm to Dr. Merrily Pew , who verbally acknowledged these results. Signed, Dulcy Fanny. Earleen Newport, DO [facility] Radiology Electronically Signed   By: Corrie Mckusick D.O.   On: 08/28/2016 17:57   Ct Abdomen Pelvis W Contrast  Result Date: 08/28/2016 CLINICAL DATA:  66 year old female with a history of shortness of breath EXAM: CT ANGIOGRAPHY  CHEST CT ABDOMEN AND PELVIS WITH CONTRAST TECHNIQUE: Multidetector CT imaging of the chest was performed using the standard protocol during bolus administration of intravenous contrast. Multiplanar CT image reconstructions and MIPs were obtained to evaluate the vascular anatomy. Multidetector CT imaging of the abdomen and pelvis was performed using the standard protocol during bolus administration of intravenous contrast. CONTRAST:  100 cc Isovue 370 COMPARISON:  None. FINDINGS: CTA CHEST FINDINGS Cardiovascular: Heart: No cardiomegaly. No pericardial fluid/thickening. No significant coronary calcifications. Right and left ventricles are somewhat compressed by the large left-sided diaphragmatic herniation. Estimated diameter of the left ventricle measures 20 mm with estimated diameter of the right ventricle measuring 43 mm. Aorta: Unremarkable course, caliber, contour of the thoracic aorta. No aneurysm or dissection flap. No periaortic fluid. Pulmonary arteries: Filling defects of the right-sided pulmonary arteries involving the segmental branches of the right lower lobe, right middle lobe, right upper lobe. No filling defects within left-sided pulmonary arteries identified. Mediastinum/Nodes: Mediastinal structures somewhat compressed by the diaphragmatic hernia. Lymph nodes are present within the mediastinum, with the lowest peritracheal lymph nodes borderline enlarged. Lungs/Pleura: Right lung with no pleural effusion or confluent airspace disease. Atelectatic changes at the base of the left lung.  No pneumothorax. Small nodule within the right upper lobe (image 30 of series 6). Musculoskeletal: No displaced fracture. Degenerative changes of the spine. Review of the MIP images confirms the above findings. CT ABDOMEN and PELVIS FINDINGS Hepatobiliary: Rounded hypodense lesion of the inferior right liver measures 7 mm (image 22 of series 12) and 10 mm (image 26 of series 12). Unremarkable appearance the  gallbladder. Pancreas: Unremarkable appearance of the pancreas. Spleen: Unremarkable appearance of spleen Adrenals/Urinary Tract: Unremarkable appearance the bilateral adrenal glands. No evidence of left-sided or right-sided hydronephrosis. No nephrolithiasis. No perinephric stranding. On the right there is an ill-defined hypodense/ hypoenhancing region at the posterior/ lateral cortex of the kidney measuring 10 mm. Stomach/Bowel: Herniation of the majority of the stomach into the left chest through diaphragmatic hernia. The splenic flexure is herniated into the left chest. No evidence of abnormally distended small bowel or colon. Vascular/Lymphatic: Extensive nodularity of the mesenteric fat associated with the stomach, splenic flexure, and the mesenteric fat herniated into the left chest. Nodular tissue associated with the vasculature. There is circumferential soft tissue surrounding the left gastric artery after the origin from the celiac artery soft tissue essentially in cases the gastric artery just after the origin. Hazy infiltration of the mesenteric fat within the left upper quadrant. Enhancing peritoneal surfaces in the low abdomen/pelvis, involving nearly all  the visualized at adnexa and the mesenteric fat associated with small bowel. Small volume ascites in the abdomen/pelvis. Unremarkable course caliber and contour of the abdominal aorta. No significant atherosclerotic changes. Proximal femoral vasculature patent. Nonocclusive venous thrombus within the left iliac system at the confluence of internal iliac vein in the external iliac vein. Proximal femoral veins unremarkable. Reproductive: Enhancing soft tissue nodularity associated with the right and left adnexa, with the surfaces of the broad ligaments in case with enhancing soft tissue. Other: Enhancing soft tissue within the umbilical region, likely pathologic lymph node. Musculoskeletal: No displaced fracture. No significant degenerative changes of  the hips. Review of the MIP images confirms the above findings. IMPRESSION: The study is positive for right-sided pulmonary emboli involving segmental and subsegmental branches. Assessing for heart strain is limited given the distortion secondary to left-sided diaphragmatic hernia. If there is concern for right-sided heart strain, correlation with cardiac ECHO may be useful. Left-sided diaphragmatic hernia, involving majority of the stomach, splenic flexure, and large portion of the associated mesentery. 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 Alternative site of a presumed malignancy of the left upper quadrant could be the splenic flexure. Correlation with endoscopy and possibly colonoscopy recommended. 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 Enhancing lesion within the umbilical region, likely metastatic involvement of lymph node. Small hypodense lesions of the right liver, too small to characterize and possibly benign cysts. Correlation with any future PET-CT may be useful. Low-density lesion of the lateral cortex of the right kidney. Differential diagnosis includes kidney infarction, infection, or tumor implant. Small nodule of the right upper lobe measuring 4 mm. Correlation with future imaging recommended. Nonocclusive left iliac DVT. These results were called by telephone at the time of interpretation on 08/28/2016 at 5:52 pm to Dr. Merrily Pew , who verbally acknowledged these results. Signed, Dulcy Fanny. Earleen Newport, DO [facility] Electronically Signed   By: Corrie Mckusick D.O.   On: 08/28/2016 17:57     Procedures Procedures (including critical care time)  CRITICAL CARE Performed by: Merrily Pew Total critical care time: 55 minutes Critical care time was exclusive of separately billable procedures and treating other patients. Critical care was necessary to treat or prevent imminent or life-threatening deterioration. Critical care was time spent personally by me on the following activities: development of treatment plan with patient and/or surrogate as well as nursing, discussions with consultants, evaluation of patient's response to treatment, examination of patient, obtaining history from patient or surrogate, ordering and performing treatments and interventions, ordering and review of laboratory studies, ordering and review of radiographic studies, pulse oximetry and re-evaluation of patient's condition.   Medications Ordered in ED Medications  0.9 %  sodium chloride infusion (not administered)  pantoprazole (PROTONIX) injection 40 mg (not administered)  0.9 %  sodium chloride infusion (10 mL/hr Intravenous Transfusing/Transfer 08/28/16 1954)  iopamidol (ISOVUE-370) 76 % injection 100 mL (100 mLs Intravenous Contrast Given 08/28/16 1657)     Initial Impression / Assessment and Plan / ED Course  I have reviewed the triage vital signs and the nursing notes.  Pertinent labs & imaging results that were available during my care of the patient were reviewed by me and considered in my medical  decision making (see chart for details).    Diagnosed with DVT 2 weeks ago, started xarelto. Become SOB, stopped xarelto last Friday. Today SOB significantly worsened.  Exam with tachyphnea, tachycardia, mild distress. Normal oxygen on O2, unsure what it is on room air.  Likely PE. Less likely HF. Will eval for both. Heparin started. Received critical lab of 4.4 before heparin initiated, discontinued and added on FOBT, anemia panel, type/screen and transfusion. Still pending CT.  Ct read by me as  PE/hiatal hernia and what appears to be possibly uterine malignancy.  Discussed with radiologist who agrees with PE/hernia but thinks it is more likely gastric vs splenic flexure as primary source of malignancy with carcinomatosis.  Patient with possibly R heart strain, PE, symptomatic anemia and new cancer diagnosis so will start blood transfusion. Hold on anticoagulation for now 2/2 low Hb. Plan for transfer [facility] to have eval by CCM, oncology, GI, surgery, interventional radiology and likely get an echo. Will defer decision for further anticoagulation to the primary team in the hospital.    Final Clinical Impressions(s) / ED Diagnoses   Final diagnoses:  SOB (shortness of breath)  Anemia, unspecified type  Tachycardia  Acute deep vein thrombosis (DVT) of left lower extremity, unspecified vein (HCC)  Leukocytosis, unspecified type  Other pulmonary embolism without acute cor pulmonale, unspecified chronicity (HCC)      Aidenjames Heckmann, Corene Cornea, MD 08/28/16 2304

## 2016-08-28 NOTE — ED Notes (Signed)
MD at the bedside  

## 2016-08-28 NOTE — ED Notes (Signed)
[facility] here for transport, pt is stable states she is feeling better

## 2016-08-28 NOTE — ED Notes (Signed)
Date and time results received: 08/28/16 4:39 PM  (use smartphrase ".now" to insert current time)  Test: HGB Critical Value: 4.4  Name of Provider Notified: [facility]  Orders Received? Or Actions Taken?: Orders Received - See Orders for details

## 2016-08-28 NOTE — ED Notes (Signed)
Pt very SOB, can not get good pleth to get 02 reading, pt on 4 L,  And helping with SOB

## 2016-08-28 NOTE — ED Notes (Signed)
Lab at the bedside 

## 2016-08-28 NOTE — ED Notes (Signed)
Date and time results received: 08/28/16 4:37 PM (use smartphrase ".now" to insert current time)  Test: Troponin Critical Value: 0.22  Name of Provider Notified: [facility]  Orders Received? Or Actions Taken?: Orders Received - See Orders for details

## 2016-08-29 ENCOUNTER — Inpatient Hospital Stay (HOSPITAL_COMMUNITY): Payer: 59

## 2016-08-29 ENCOUNTER — Encounter (HOSPITAL_COMMUNITY): Payer: Self-pay | Admitting: General Surgery

## 2016-08-29 DIAGNOSIS — K922 Gastrointestinal hemorrhage, unspecified: Secondary | ICD-10-CM

## 2016-08-29 DIAGNOSIS — R0902 Hypoxemia: Secondary | ICD-10-CM

## 2016-08-29 DIAGNOSIS — I2699 Other pulmonary embolism without acute cor pulmonale: Secondary | ICD-10-CM

## 2016-08-29 HISTORY — PX: IR IVC FILTER PLMT / S&I /IMG GUID/MOD SED: IMG701

## 2016-08-29 LAB — GLUCOSE, CAPILLARY
GLUCOSE-CAPILLARY: 100 mg/dL — AB (ref 65–99)
GLUCOSE-CAPILLARY: 100 mg/dL — AB (ref 65–99)
GLUCOSE-CAPILLARY: 114 mg/dL — AB (ref 65–99)
GLUCOSE-CAPILLARY: 116 mg/dL — AB (ref 65–99)
GLUCOSE-CAPILLARY: 133 mg/dL — AB (ref 65–99)
GLUCOSE-CAPILLARY: 99 mg/dL (ref 65–99)

## 2016-08-29 LAB — COMPREHENSIVE METABOLIC PANEL
ALT: 26 U/L (ref 14–54)
ANION GAP: 7 (ref 5–15)
AST: 41 U/L (ref 15–41)
Albumin: 2.3 g/dL — ABNORMAL LOW (ref 3.5–5.0)
Alkaline Phosphatase: 72 U/L (ref 38–126)
BILIRUBIN TOTAL: 1.5 mg/dL — AB (ref 0.3–1.2)
BUN: 19 mg/dL (ref 6–20)
CALCIUM: 7.9 mg/dL — AB (ref 8.9–10.3)
CO2: 20 mmol/L — ABNORMAL LOW (ref 22–32)
Chloride: 112 mmol/L — ABNORMAL HIGH (ref 101–111)
Creatinine, Ser: 0.99 mg/dL (ref 0.44–1.00)
GFR, EST NON AFRICAN AMERICAN: 59 mL/min — AB (ref >60)
GLUCOSE: 113 mg/dL — AB (ref 65–99)
POTASSIUM: 4.3 mmol/L (ref 3.5–5.1)
Sodium: 139 mmol/L (ref 135–145)
TOTAL PROTEIN: 6.3 g/dL — AB (ref 6.5–8.1)

## 2016-08-29 LAB — URINALYSIS, ROUTINE W REFLEX MICROSCOPIC
BILIRUBIN URINE: NEGATIVE
Glucose, UA: NEGATIVE mg/dL
Hgb urine dipstick: NEGATIVE
KETONES UR: NEGATIVE mg/dL
Nitrite: NEGATIVE
Protein, ur: NEGATIVE mg/dL
Specific Gravity, Urine: >1.046 — ABNORMAL HIGH (ref 1.005–1.030)
pH: 6 (ref 5.0–8.0)

## 2016-08-29 LAB — CBC
HCT: 17.6 % — ABNORMAL LOW (ref 36.0–46.0)
HCT: 32.7 % — ABNORMAL LOW (ref 36.0–46.0)
HEMATOCRIT: 31.8 % — AB (ref 36.0–46.0)
Hemoglobin: 5.2 g/dL — CL (ref 12.0–15.0)
Hemoglobin: 10.1 g/dL — ABNORMAL LOW (ref 12.0–15.0)
Hemoglobin: 10.4 g/dL — ABNORMAL LOW (ref 12.0–15.0)
MCH: 23.9 pg — AB (ref 26.0–34.0)
MCH: 26.2 pg (ref 26.0–34.0)
MCH: 26.1 pg (ref 26.0–34.0)
MCHC: 29.5 g/dL — ABNORMAL LOW (ref 30.0–36.0)
MCHC: 31.8 g/dL (ref 30.0–36.0)
MCHC: 31.8 g/dL (ref 30.0–36.0)
MCV: 80.7 fL (ref 78.0–100.0)
MCV: 82.4 fL (ref 78.0–100.0)
MCV: 82.2 fL (ref 78.0–100.0)
PLATELETS: 117 10*3/uL — AB (ref 150–400)
PLATELETS: 89 10*3/uL — AB (ref 150–400)
PLATELETS: 88 10*3/uL — AB (ref 150–400)
RBC: 2.18 MIL/uL — AB (ref 3.87–5.11)
RBC: 3.86 MIL/uL — ABNORMAL LOW (ref 3.87–5.11)
RBC: 3.98 MIL/uL (ref 3.87–5.11)
RDW: 18.3 % — ABNORMAL HIGH (ref 11.5–15.5)
RDW: 18.4 % — ABNORMAL HIGH (ref 11.5–15.5)
RDW: 18.6 % — ABNORMAL HIGH (ref 11.5–15.5)
WBC: 12.5 10*3/uL — AB (ref 4.0–10.5)
WBC: 14 10*3/uL — ABNORMAL HIGH (ref 4.0–10.5)
WBC: 14 10*3/uL — ABNORMAL HIGH (ref 4.0–10.5)

## 2016-08-29 LAB — ECHOCARDIOGRAM COMPLETE
HEIGHTINCHES: 62 in
WEIGHTICAEL: 2208 [oz_av]

## 2016-08-29 LAB — PREPARE RBC (CROSSMATCH)

## 2016-08-29 LAB — PHOSPHORUS: Phosphorus: 3.7 mg/dL (ref 2.5–4.6)

## 2016-08-29 LAB — MAGNESIUM: MAGNESIUM: 2.2 mg/dL (ref 1.7–2.4)

## 2016-08-29 LAB — LACTIC ACID, PLASMA: LACTIC ACID, VENOUS: 0.9 mmol/L (ref 0.5–1.9)

## 2016-08-29 LAB — ABO/RH: ABO/RH(D): A POS

## 2016-08-29 LAB — MRSA PCR SCREENING: MRSA BY PCR: NEGATIVE

## 2016-08-29 MED ORDER — IOPAMIDOL (ISOVUE-300) INJECTION 61%
INTRAVENOUS | Status: AC
  Administered 2016-08-29: 40 mL
  Filled 2016-08-29: qty 100

## 2016-08-29 MED ORDER — PERFLUTREN LIPID MICROSPHERE
1.0000 mL | INTRAVENOUS | Status: AC | PRN
  Administered 2016-08-29: 1.5 mL via INTRAVENOUS
  Filled 2016-08-29: qty 10

## 2016-08-29 MED ORDER — NALOXONE HCL 0.4 MG/ML IJ SOLN
INTRAMUSCULAR | Status: AC
  Filled 2016-08-29: qty 1

## 2016-08-29 MED ORDER — FLUMAZENIL 0.5 MG/5ML IV SOLN
INTRAVENOUS | Status: AC
  Filled 2016-08-29: qty 5

## 2016-08-29 MED ORDER — SODIUM CHLORIDE 0.9 % IV SOLN
Freq: Once | INTRAVENOUS | Status: AC
  Administered 2016-08-29: 03:00:00 via INTRAVENOUS

## 2016-08-29 MED ORDER — SODIUM CHLORIDE 0.9 % IV SOLN
INTRAVENOUS | Status: DC
  Administered 2016-08-29: 20:00:00 via INTRAVENOUS

## 2016-08-29 MED ORDER — FENTANYL CITRATE (PF) 100 MCG/2ML IJ SOLN
INTRAMUSCULAR | Status: AC | PRN
  Administered 2016-08-29: 50 ug via INTRAVENOUS
  Administered 2016-08-29: 25 ug via INTRAVENOUS

## 2016-08-29 MED ORDER — LIDOCAINE HCL 1 % IJ SOLN
INTRAMUSCULAR | Status: AC
  Filled 2016-08-29: qty 20

## 2016-08-29 MED ORDER — IOPAMIDOL (ISOVUE-300) INJECTION 61%: 100.0000 mL | Freq: Once | INTRAVENOUS | Status: DC | PRN

## 2016-08-29 MED ORDER — MIDAZOLAM HCL 2 MG/2ML IJ SOLN
INTRAMUSCULAR | Status: AC | PRN
  Administered 2016-08-29: 0.5 mg via INTRAVENOUS
  Administered 2016-08-29: 1 mg via INTRAVENOUS

## 2016-08-29 MED ORDER — LIDOCAINE HCL 1 % IJ SOLN
INTRAMUSCULAR | Status: AC | PRN
  Administered 2016-08-29: 10 mL

## 2016-08-29 MED ORDER — MIDAZOLAM HCL 2 MG/2ML IJ SOLN
INTRAMUSCULAR | Status: AC
  Filled 2016-08-29: qty 4

## 2016-08-29 MED ORDER — FENTANYL CITRATE (PF) 100 MCG/2ML IJ SOLN
INTRAMUSCULAR | Status: AC
  Filled 2016-08-29: qty 4

## 2016-08-29 NOTE — Progress Notes (Signed)
PULMONARY / CRITICAL CARE MEDICINE   Name: Jodi Collins MRN: [id number] DOB: 1951-01-12    ADMISSION DATE:  08/28/2016 CONSULTATION DATE:  08/28/2016  REFERRING MD:  Dr. Dayna Barker EDP  CHIEF COMPLAINT:  Dyspnea  HISTORY OF PRESENT ILLNESS:   66 year old female with PMH as below, which is significant for recent dx left DVT started on Xarelto after a long flight. She presented to [facility] ED 6/11 with complaints of SOB x several days with associated cough, leg pain, and leg swelling. Upon arrival to the ED she was markedly dyspneic despite the application of 4L O2 via [place]. There was obviously concern for PE and she was started on heparin. CTA did demonstrate R sided pulmonary emboli, however, lab work resulted and was significant for hemoglobin of 4.4. Heparin infusion was stopped and she was set up for blood transfusion. Other significant lab workup includes CO2 16, creatinine 1.14, BMP 347, Troponin 0.22, WBC 13.2,  plt 180, INR 1.5. 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 She was transferred to [facility] for ICU admission. PCCM to admit.   Reports Stop Taking Xarelto after one week due to increase in dyspnea. Additionally, reports that for about the last week bowel movements have been almost black. States for the last few days has been dizzy and felt weak.    SUBJECTIVE:  Denies pain, reports improvement in dyspnea   VITAL SIGNS: BP 131/89   Pulse 91   Temp 98.6 F (37 C) (Oral)   Resp (!) 24   Ht 5\' 2"  (1.575 m)   Wt 138 lb (62.6 kg)   SpO2 97%   BMI 25.24 kg/m   HEMODYNAMICS:    VENTILATOR SETTINGS:    INTAKE / OUTPUT: I/O last 3 completed shifts: In: 5462 [I.V.:110; Blood:1064] Out: 200 [Urine:200]  PHYSICAL EXAMINATION: General:  WNWD AAF NAD @ rest HEENT: MM pink/moist, no jvd/lan PSY: Awake and interactive Neuro: Alert and interactive, moving all ext to command CV: RRR, Nl  S1/S2, -M/R/G. PULM: even/non-labored, lungs bilaterally  clear GI:Non tender, dark stools reported, +bs Extremities: warm/dry, No edema  Skin: no rashes or lesions  LABS:  BMET  Recent Labs Lab 08/28/16 1547  NA 138  K 4.4  CL 106  CO2 16*  BUN 21*  CREATININE 1.14*  GLUCOSE 148*    Electrolytes  Recent Labs Lab 08/28/16 1547 08/29/16 0154  CALCIUM 8.3*  --   MG  --  2.2  PHOS  --  3.7    CBC  Recent Labs Lab 08/28/16 1547 08/28/16 2301  WBC 13.2* 12.5*  HGB 4.4* 5.2*  HCT 15.5* 17.6*  PLT 180 117*    Coag's  Recent Labs Lab 08/28/16 1547 08/28/16 1642  APTT  --  28  INR 1.50  --     Sepsis Markers  Recent Labs Lab 08/29/16 0154  LATICACIDVEN 0.9    ABG No results for input(s): PHART, PCO2ART, PO2ART in the last 168 hours.  Liver Enzymes  Recent Labs Lab 08/28/16 1547  AST 54*  ALT 32  ALKPHOS 77  BILITOT 0.6  ALBUMIN 2.6*    Cardiac Enzymes  Recent Labs Lab 08/28/16 1547  TROPONINI 0.22*    Glucose  Recent Labs Lab 08/28/16 2354 08/29/16 0359  GLUCAP 116* 133*    Imaging Ct Angio Chest W/cm &/or Wo Cm  Result Date: 08/28/2016 CLINICAL DATA:  66 year old female with a history of  shortness of breath EXAM: CT ANGIOGRAPHY CHEST CT ABDOMEN AND PELVIS WITH CONTRAST TECHNIQUE: Multidetector CT imaging of the chest was performed using the standard protocol during bolus administration of intravenous contrast. Multiplanar CT image reconstructions and MIPs were obtained to evaluate the vascular anatomy. Multidetector CT imaging of the abdomen and pelvis was performed using the standard protocol during bolus administration of intravenous contrast. CONTRAST:  100 cc Isovue 370 COMPARISON:  None. FINDINGS: CTA CHEST FINDINGS Cardiovascular: Heart: No cardiomegaly. No pericardial fluid/thickening. No significant coronary calcifications. Right and left ventricles are somewhat compressed by the large left-sided diaphragmatic  herniation. Estimated diameter of the left ventricle measures 20 mm with estimated diameter of the right ventricle measuring 43 mm. Aorta: Unremarkable course, caliber, contour of the thoracic aorta. No aneurysm or dissection flap. No periaortic fluid. Pulmonary arteries: Filling defects of the right-sided pulmonary arteries involving the segmental branches of the right lower lobe, right middle lobe, right upper lobe. No filling defects within left-sided pulmonary arteries identified. Mediastinum/Nodes: Mediastinal structures somewhat compressed by the diaphragmatic hernia. Lymph nodes are present within the mediastinum, with the lowest peritracheal lymph nodes borderline enlarged. Lungs/Pleura: Right lung with no pleural effusion or confluent airspace disease. Atelectatic changes at the base of the left lung.  No pneumothorax. Small nodule within the right upper lobe (image 30 of series 6). Musculoskeletal: No displaced fracture. Degenerative changes of the spine. Review of the MIP images confirms the above findings. CT ABDOMEN and PELVIS FINDINGS Hepatobiliary: Rounded hypodense lesion of the inferior right liver measures 7 mm (image 22 of series 12) and 10 mm (image 26 of series 12). Unremarkable appearance the gallbladder. Pancreas: Unremarkable appearance of the pancreas. Spleen: Unremarkable appearance of spleen Adrenals/Urinary Tract: Unremarkable appearance the bilateral adrenal glands. No evidence of left-sided or right-sided hydronephrosis. No nephrolithiasis. No perinephric stranding. On the right there is an ill-defined hypodense/ hypoenhancing region at the posterior/ lateral cortex of the kidney measuring 10 mm. Stomach/Bowel: Herniation of the majority of the stomach into the left chest through diaphragmatic hernia. The splenic flexure is herniated into the left chest. No evidence of abnormally distended small bowel or colon. Vascular/Lymphatic: Extensive nodularity of the mesenteric fat associated  with the stomach, splenic flexure, and the mesenteric fat herniated into the left chest. Nodular tissue associated with the vasculature. There is circumferential soft tissue surrounding the left gastric artery after the origin from the celiac artery soft tissue essentially in cases the gastric artery just after the origin. Hazy infiltration of the mesenteric fat within the left upper quadrant. Enhancing peritoneal surfaces in the low abdomen/pelvis, involving nearly all the visualized at adnexa and the mesenteric fat associated with small bowel. Small volume ascites in the abdomen/pelvis. Unremarkable course caliber and contour of the abdominal aorta. No significant atherosclerotic changes. Proximal femoral vasculature patent. Nonocclusive venous thrombus within the left iliac system at the confluence of internal iliac vein in the external iliac vein. Proximal femoral veins unremarkable. Reproductive: Enhancing soft tissue nodularity associated with the right and left adnexa, with the surfaces of the broad ligaments in case with enhancing soft tissue. Other: Enhancing soft tissue within the umbilical region, likely pathologic lymph node. Musculoskeletal: No displaced fracture. No significant degenerative changes of the hips. Review of the MIP images confirms the above findings. IMPRESSION: The study is positive for right-sided pulmonary emboli involving segmental and subsegmental branches. Assessing for heart strain is limited given the distortion secondary to left-sided diaphragmatic hernia. If there is concern for right-sided heart strain, correlation with  cardiac ECHO may be useful. Left-sided diaphragmatic hernia, involving majority of the stomach, splenic flexure, and large portion of the associated mesentery. 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 Alternative site  of a presumed malignancy of the left upper quadrant could be the splenic flexure. Correlation with endoscopy and possibly colonoscopy recommended. 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 Enhancing lesion within the umbilical region, likely metastatic involvement of lymph node. Small hypodense lesions of the right liver, too small to characterize and possibly benign cysts. Correlation with any future PET-CT may be useful. Low-density lesion of the lateral cortex of the right kidney. Differential diagnosis includes kidney infarction, infection, or tumor implant. Small nodule of the right upper lobe measuring 4 mm. Correlation with future imaging recommended. Nonocclusive left iliac DVT. These results were called by telephone at the time of interpretation on 08/28/2016 at 5:52 pm to Dr. Merrily Pew , who verbally acknowledged these results. Signed, Dulcy Fanny. Earleen Newport, DO [facility] Radiology Electronically Signed   By: Corrie Mckusick D.O.   On: 08/28/2016 17:57   Ct Abdomen Pelvis W Contrast  Result Date: 08/28/2016 CLINICAL DATA:  66 year old female with a history of shortness of breath EXAM: CT ANGIOGRAPHY CHEST CT ABDOMEN AND PELVIS WITH CONTRAST TECHNIQUE: Multidetector CT imaging of the chest was performed using the standard protocol during bolus administration of intravenous contrast. Multiplanar CT image reconstructions and MIPs were obtained to evaluate the vascular anatomy. Multidetector CT imaging of the abdomen and pelvis was performed using the standard protocol during bolus administration of intravenous contrast. CONTRAST:  100 cc Isovue 370 COMPARISON:  None. FINDINGS: CTA CHEST FINDINGS Cardiovascular: Heart: No cardiomegaly. No pericardial fluid/thickening. No  significant coronary calcifications. Right and left ventricles are somewhat compressed by the large left-sided diaphragmatic herniation. Estimated diameter of the left ventricle measures 20 mm with estimated diameter of the right ventricle measuring 43 mm. Aorta: Unremarkable course, caliber, contour of the thoracic aorta. No aneurysm or dissection flap. No periaortic fluid. Pulmonary arteries: Filling defects of the right-sided pulmonary arteries involving the segmental branches of the right lower lobe, right middle lobe, right upper lobe. No filling defects within left-sided pulmonary arteries identified. Mediastinum/Nodes: Mediastinal structures somewhat compressed by the diaphragmatic hernia. Lymph nodes are present within the mediastinum, with the lowest peritracheal lymph nodes borderline enlarged. Lungs/Pleura: Right lung with no pleural effusion or confluent airspace disease. Atelectatic changes at the base of the left lung.  No pneumothorax. Small nodule within the right upper lobe (image 30 of series 6). Musculoskeletal: No displaced fracture. Degenerative changes of the spine. Review of the MIP images confirms the above findings. CT ABDOMEN and PELVIS FINDINGS Hepatobiliary: Rounded hypodense lesion of the inferior right liver measures 7 mm (image 22 of series 12) and 10 mm (image 26 of series 12). Unremarkable appearance the gallbladder. Pancreas: Unremarkable appearance of the pancreas. Spleen: Unremarkable appearance of spleen Adrenals/Urinary Tract: Unremarkable appearance the bilateral adrenal glands. No evidence of left-sided or right-sided hydronephrosis. No nephrolithiasis. No perinephric stranding. On the right there is an ill-defined hypodense/ hypoenhancing region at the posterior/ lateral cortex of the kidney measuring 10 mm. Stomach/Bowel: Herniation of the majority of the stomach into the left chest through diaphragmatic hernia. The  splenic flexure is herniated into the left chest. No  evidence of abnormally distended small bowel or colon. Vascular/Lymphatic: Extensive nodularity of the mesenteric fat associated with the stomach, splenic flexure, and the mesenteric fat herniated into the left chest. Nodular tissue associated with the vasculature. There is circumferential soft tissue surrounding the left gastric artery after the origin from the celiac artery soft tissue essentially in cases the gastric artery just after the origin. Hazy infiltration of the mesenteric fat within the left upper quadrant. Enhancing peritoneal surfaces in the low abdomen/pelvis, involving nearly all the visualized at adnexa and the mesenteric fat associated with small bowel. Small volume ascites in the abdomen/pelvis. Unremarkable course caliber and contour of the abdominal aorta. No significant atherosclerotic changes. Proximal femoral vasculature patent. Nonocclusive venous thrombus within the left iliac system at the confluence of internal iliac vein in the external iliac vein. Proximal femoral veins unremarkable. Reproductive: Enhancing soft tissue nodularity associated with the right and left adnexa, with the surfaces of the broad ligaments in case with enhancing soft tissue. Other: Enhancing soft tissue within the umbilical region, likely pathologic lymph node. Musculoskeletal: No displaced fracture. No significant degenerative changes of the hips. Review of the MIP images confirms the above findings. IMPRESSION: The study is positive for right-sided pulmonary emboli involving segmental and subsegmental branches. Assessing for heart strain is limited given the distortion secondary to left-sided diaphragmatic hernia. If there is concern for right-sided heart strain, correlation with cardiac ECHO may be useful. Left-sided diaphragmatic hernia, involving majority of the stomach, splenic flexure, and large portion of the associated mesentery. Extensive soft tissue associated with the stomach body along the lesser  curvature near the GE junction and surrounding the left gastric artery, concerning for primary gastric malignancy. Alternative site of a presumed malignancy of the left upper quadrant could be the splenic flexure. Correlation with endoscopy and possibly colonoscopy recommended. 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 Enhancing lesion within the umbilical region, likely metastatic involvement of lymph node. Small hypodense lesions of the right liver, too small to characterize and possibly benign cysts. Correlation with any future PET-CT may be useful. Low-density lesion of the lateral cortex of the right kidney. Differential diagnosis includes kidney infarction, infection, or tumor implant. Small nodule of the right upper lobe measuring 4 mm. Correlation with future imaging recommended. Nonocclusive left iliac DVT. These results were called by telephone at the time of interpretation on 08/28/2016 at 5:52 pm to Dr. Merrily Pew , who verbally acknowledged these results. Signed, Dulcy Fanny. Earleen Newport, DO [facility] Radiology Electronically Signed   By: Corrie Mckusick D.O.   On: 08/28/2016 17:57     STUDIES:  CTA chest and abdomen 6/11 > Right sided pulmonary emboli involving segmental and sub-segmental branches. Could not assess for heart strain due to large diaphragmatic hernia. 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 There is associated malignant  ascites. Enhancing lesion within the umbilical region, likely metastatic involvement of lymph node.  CULTURES: None  ANTIBIOTICS: None  SIGNIFICANT EVENTS: 6/11 > Presents to ED with Dyspnea  6/12 4 units PRBC 6/12 for IVC filter 6/12 CT abd ? Gastric cancer 6/12 GI consulted  LINES/TUBES: PIV  DISCUSSION: 66 year old female with diagnosis of DVT in last 2 weeks (started on xarelto last Friday), reports not taking but for one week. 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 Trend BMP Follow AG   GASTROINTESTINAL A:   Large diaphragmatic hernia ?GI Bleed > reports dark stool over the last week  ? Gastric cancer P:   NPO for now Protonix for SUP GI consult called early am 6/12 gor eval GIB ? Gastric ca May need Hemonc/surgery consult  HEMATOLOGIC  Recent Labs  08/28/16 1547 08/28/16 2301  HGB 4.4* 5.2*    A:   Acute blood loss anemia  Pulmonary emboli Residual DVT L iliac on CTA ?Mets (CTA reveals extensive carcinomatosis) P:  Transfusing total 4 PRBC with follow up cbc Serial CBC Unable to anticoagulate IR Consult for IVC filter planned for 6/12 Holding home xarelto  INFECTIOUS A:   Leukocytosis P:   WBC and Fever Curves trending down 6/12  ENDOCRINE A:   Hyperglycemia    CBG (last 3)   Recent Labs  08/28/16 2354 08/29/16 0359  GLUCAP 116* 133*     P:   Trend Glucose   NEUROLOGIC A:    No issues  P:   Monitor   FAMILY  - Updates: Patient updated at bedside, no family present   - Inter-disciplinary family meet or Palliative Care meeting due by:  6/18  CC Time: 74 min   Richardson Landry Minor ACNP Maryanna Shape PCCM Pager [phone] till 3 pm If no answer page [phone] 08/29/2016, 7:29 AM  Attending Note:  66 year old female with GI bleeding, PE and abdominal mass.  GI and IR consulted.  On exam, lungs are clear.  I reviewed CXR myself, no acute disease noted.  Transfused a unit yesterday.  Hg remain very low.  Will give another unit then recheck CBC.  IR seeing patient now for placement of IVC filter.  After stabilization will need to work up mass.  Patient updated bedside.  Discussed with IR and PCCM-NP.  Hold in ICU today given rate of bleeding.  The patient is critically ill with multiple organ systems failure and requires high complexity decision making for assessment and support, frequent evaluation and titration of therapies, application of advanced monitoring technologies and extensive interpretation of multiple databases.   Critical Care Time devoted to patient care services described in this note is  35  Minutes. This time reflects time of care of this signee Dr Jennet Maduro. This critical care time does not reflect procedure time, or teaching time or supervisory time of PA/NP/Med student/Med Resident etc but could involve care discussion time.  Rush Farmer, M.D. [facility] Pulmonary/Critical Care Medicine. Pager: [phone]. After hours pager: [phone].

## 2016-08-29 NOTE — Procedures (Signed)
Interventional Radiology Procedure Note  Procedure:  US guided right IJ access.  Retrievable IVC filter ([place]) placement at L2.  Complications: None Recommendations:  - Ok to shower tomorrow - Do not submerge for 7 days - Routine care - Patient may be seen by [place] [facility] when a candidate for filter removal and initiation of anti-coagulation. [phone]  Signed,  Dulcy Fanny Earleen Newport, DO

## 2016-08-29 NOTE — Consult Note (Signed)
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  She was transferred from [facility] after she presented with persistent melena.  Recently, after a plane flight she suffered with a DVT in the left lower extremity.  Xarelto as initiated, which then resulted in melenic stools and SOB.  She stopped the medication, but her symptoms continued to progress.  In the ER she was noted to have an HGB in the 4 range and her baseline is in the 12 range.  She was evaluated with a colonoscopy by Dr. Collene Mares in 2014 with a finding of an adenomatous polyp.  No prior EGD was required in the past.  A CT scan was obtained and it showed a proximal gastric mass with carcinomatosis.  She also has multiple filling defects in the right lung.  She is now s/p IVC placement and transfusion with a current HGB of 10.1 g/dL.  Her father had gastric cancer in his 54's and he died from the disease.  She does not recall any prior issues with melena prior to starting on Xarelto.  Also, she denies any problems with abdominal pain, nausea, vomiting, or early satiety.  Past Medical History:  Diagnosis Date  . DVT (deep venous thrombosis) ([place])     Past Surgical History:  Procedure Laterality Date  . CERVICAL CONIZATION W/BX  2012   high grade squamous intraepithelial dysplasia.  Dr Garwin Brothers.   . COLONOSCOPY W/ POLYPECTOMY  10/2006 and 10/2011   Dr Collene Mares.  had adenomatous polyps.    . IR IVC FILTER PLMT / S&I /IMG GUID/MOD SED  08/29/2016    History reviewed. No pertinent family history.  Social History:  reports that she has never smoked. She has never used smokeless tobacco. She reports that she drinks alcohol. She reports that she does not use drugs.  Allergies: No Known Allergies  Medications:  Scheduled: . fentaNYL      . lidocaine      .  midazolam      . pantoprazole (PROTONIX) IV  40 mg Intravenous Q24H   Continuous: . sodium chloride Stopped (08/28/16 2100)    Results for orders placed or performed during the hospital encounter of 08/28/16 (from the past 24 hour(s))  Comprehensive metabolic panel     Status: Abnormal   Collection Time: 08/28/16  3:47 PM  Result Value Ref Range   Sodium 138 135 - 145 mmol/L   Potassium 4.4 3.5 - 5.1 mmol/L   Chloride 106 101 - 111 mmol/L   CO2 16 (L) 22 - 32 mmol/L   Glucose, Bld 148 (H) 65 - 99 mg/dL   BUN 21 (H) 6 - 20 mg/dL   Creatinine, Ser 1.14 (H) 0.44 - 1.00 mg/dL   Calcium 8.3 (L) 8.9 - 10.3 mg/dL   Total Protein 7.1 6.5 - 8.1 g/dL   Albumin 2.6 (L) 3.5 - 5.0 g/dL   AST 54 (H) 15 - 41 U/L   ALT 32 14 - 54 U/L   Alkaline Phosphatase 77 38 - 126 U/L   Total Bilirubin 0.6 0.3 - 1.2 mg/dL   GFR calc non Af Amer 50 (L) >60 mL/min   GFR calc Af Amer 58 (L) >60 mL/min   Anion gap 16 (H) 5 - 15  CBC WITH DIFFERENTIAL     Status: Abnormal  Collection Time: 08/28/16  3:47 PM  Result Value Ref Range   WBC 13.2 (H) 4.0 - 10.5 K/uL   RBC 1.84 (L) 3.87 - 5.11 MIL/uL   Hemoglobin 4.4 (LL) 12.0 - 15.0 g/dL   HCT 15.5 (L) 36.0 - 46.0 %   MCV 84.2 78.0 - 100.0 fL   MCH 23.9 (L) 26.0 - 34.0 pg   MCHC 28.4 (L) 30.0 - 36.0 g/dL   RDW 21.8 (H) 11.5 - 15.5 %   Platelets 180 150 - 400 K/uL   Neutrophils Relative % 84 %   Neutro Abs 11.1 (H) 1.7 - 7.7 K/uL   Lymphocytes Relative 12 %   Lymphs Abs 1.6 0.7 - 4.0 K/uL   Monocytes Relative 3 %   Monocytes Absolute 0.4 0.1 - 1.0 K/uL   Eosinophils Relative 0 %   Eosinophils Absolute 0.0 0.0 - 0.7 K/uL   Basophils Relative 0 %   Basophils Absolute 0.0 0.0 - 0.1 K/uL  Brain natriuretic peptide     Status: Abnormal   Collection Time: 08/28/16  3:47 PM  Result Value Ref Range   B Natriuretic Peptide 347.0 (H) 0.0 - 100.0 pg/mL  Troponin I     Status: Abnormal   Collection Time: 08/28/16  3:47 PM  Result Value Ref Range   Troponin I  0.22 (HH) <0.03 ng/mL  Protime-INR     Status: Abnormal   Collection Time: 08/28/16  3:47 PM  Result Value Ref Range   Prothrombin Time 18.3 (H) 11.4 - 15.2 seconds   INR 1.50   Vitamin B12     Status: Abnormal   Collection Time: 08/28/16  4:42 PM  Result Value Ref Range   Vitamin B-12 5,758 (H) 180 - 914 pg/mL  Iron and TIBC     Status: Abnormal   Collection Time: 08/28/16  4:42 PM  Result Value Ref Range   Iron 16 (L) 28 - 170 ug/dL   TIBC 262 250 - 450 ug/dL   Saturation Ratios 6 (L) 10.4 - 31.8 %   UIBC 246 ug/dL  Ferritin     Status: None   Collection Time: 08/28/16  4:42 PM  Result Value Ref Range   Ferritin 26 11 - 307 ng/mL  Reticulocytes     Status: Abnormal   Collection Time: 08/28/16  4:42 PM  Result Value Ref Range   Retic Ct Pct 12.9 (H) 0.4 - 3.1 %   RBC. 1.79 (L) 3.87 - 5.11 MIL/uL   Retic Count, Manual 230.9 (H) 19.0 - 186.0 K/uL  APTT     Status: None   Collection Time: 08/28/16  4:42 PM  Result Value Ref Range   aPTT 28 24 - 36 seconds  Heparin level (unfractionated)     Status: Abnormal   Collection Time: 08/28/16  4:42 PM  Result Value Ref Range   Heparin Unfractionated <0.10 (L) 0.30 - 0.70 IU/mL  Type and screen     Status: None (Preliminary result)   Collection Time: 08/28/16  5:36 PM  Result Value Ref Range   ABO/RH(D) A POS    Antibody Screen NEG    Sample Expiration 08/31/2016    Unit Number [id number]    Blood Component Type RED CELLS,LR    Unit division 00    Status of Unit ALLOCATED    Transfusion Status OK TO TRANSFUSE    Crossmatch Result Compatible    Unit Number [id number]    Blood Component Type RED CELLS,LR    Unit division  00    Status of Unit ISSUED,FINAL    Transfusion Status OK TO TRANSFUSE    Crossmatch Result Compatible   Prepare RBC     Status: None   Collection Time: 08/28/16  5:36 PM  Result Value Ref Range   Order Confirmation ORDER PROCESSED BY BLOOD BANK   Folate     Status: None   Collection Time: 08/28/16   5:40 PM  Result Value Ref Range   Folate 18.4 >5.9 ng/mL  ABO/Rh     Status: None   Collection Time: 08/28/16  5:43 PM  Result Value Ref Range   ABO/RH(D) A POS   MRSA PCR Screening     Status: None   Collection Time: 08/28/16  8:50 PM  Result Value Ref Range   MRSA by PCR NEGATIVE NEGATIVE  CBC     Status: Abnormal   Collection Time: 08/28/16 11:01 PM  Result Value Ref Range   WBC 12.5 (H) 4.0 - 10.5 K/uL   RBC 2.18 (L) 3.87 - 5.11 MIL/uL   Hemoglobin 5.2 (LL) 12.0 - 15.0 g/dL   HCT 17.6 (L) 36.0 - 46.0 %   MCV 80.7 78.0 - 100.0 fL   MCH 23.9 (L) 26.0 - 34.0 pg   MCHC 29.5 (L) 30.0 - 36.0 g/dL   RDW 18.3 (H) 11.5 - 15.5 %   Platelets 117 (L) 150 - 400 K/uL  Glucose, capillary     Status: Abnormal   Collection Time: 08/28/16 11:54 PM  Result Value Ref Range   Glucose-Capillary 116 (H) 65 - 99 mg/dL   Comment 1 Capillary Specimen   Urinalysis, Routine w reflex microscopic     Status: Abnormal   Collection Time: 08/29/16 12:00 AM  Result Value Ref Range   Color, Urine YELLOW YELLOW   APPearance CLEAR CLEAR   Specific Gravity, Urine >1.046 (H) 1.005 - 1.030   pH 6.0 5.0 - 8.0   Glucose, UA NEGATIVE NEGATIVE mg/dL   Hgb urine dipstick NEGATIVE NEGATIVE   Bilirubin Urine NEGATIVE NEGATIVE   Ketones, ur NEGATIVE NEGATIVE mg/dL   Protein, ur NEGATIVE NEGATIVE mg/dL   Nitrite NEGATIVE NEGATIVE   Leukocytes, UA MODERATE (A) NEGATIVE   RBC / HPF 0-5 0 - 5 RBC/hpf   WBC, UA 0-5 0 - 5 WBC/hpf   Bacteria, UA RARE (A) NONE SEEN   Squamous Epithelial / LPF 0-5 (A) NONE SEEN   Mucous PRESENT   Magnesium     Status: None   Collection Time: 08/29/16  1:54 AM  Result Value Ref Range   Magnesium 2.2 1.7 - 2.4 mg/dL  Phosphorus     Status: None   Collection Time: 08/29/16  1:54 AM  Result Value Ref Range   Phosphorus 3.7 2.5 - 4.6 mg/dL  Lactic acid, plasma     Status: None   Collection Time: 08/29/16  1:54 AM  Result Value Ref Range   Lactic Acid, Venous 0.9 0.5 - 1.9 mmol/L   Type and screen [place]     Status: None (Preliminary result)   Collection Time: 08/29/16  1:58 AM  Result Value Ref Range   ABO/RH(D) A POS    Antibody Screen NEG    Sample Expiration 09/01/2016    Unit Number [id number]    Blood Component Type RED CELLS,LR    Unit division 00    Status of Unit ISSUED    Transfusion Status OK TO TRANSFUSE    Crossmatch Result Compatible    Unit  Number [id number]    Blood Component Type RED CELLS,LR    Unit division 00    Status of Unit ISSUED    Transfusion Status OK TO TRANSFUSE    Crossmatch Result Compatible    Unit Number [id number]    Blood Component Type RED CELLS,LR    Unit division 00    Status of Unit ISSUED    Transfusion Status OK TO TRANSFUSE    Crossmatch Result Compatible   ABO/Rh     Status: None   Collection Time: 08/29/16  1:58 AM  Result Value Ref Range   ABO/RH(D) A POS   Prepare RBC     Status: None   Collection Time: 08/29/16  2:39 AM  Result Value Ref Range   Order Confirmation ORDER PROCESSED BY BLOOD BANK   Glucose, capillary     Status: Abnormal   Collection Time: 08/29/16  3:59 AM  Result Value Ref Range   Glucose-Capillary 133 (H) 65 - 99 mg/dL   Comment 1 Capillary Specimen   Glucose, capillary     Status: Abnormal   Collection Time: 08/29/16  8:43 AM  Result Value Ref Range   Glucose-Capillary 114 (H) 65 - 99 mg/dL   Comment 1 Notify RN   CBC     Status: Abnormal   Collection Time: 08/29/16 11:43 AM  Result Value Ref Range   WBC 14.0 (H) 4.0 - 10.5 K/uL   RBC 3.86 (L) 3.87 - 5.11 MIL/uL   Hemoglobin 10.1 (L) 12.0 - 15.0 g/dL   HCT 31.8 (L) 36.0 - 46.0 %   MCV 82.4 78.0 - 100.0 fL   MCH 26.2 26.0 - 34.0 pg   MCHC 31.8 30.0 - 36.0 g/dL   RDW 18.4 (H) 11.5 - 15.5 %   Platelets 89 (L) 150 - 400 K/uL  Comprehensive metabolic panel     Status: Abnormal   Collection Time: 08/29/16 11:43 AM  Result Value Ref Range   Sodium 139 135 - 145 mmol/L   Potassium 4.3 3.5 - 5.1  mmol/L   Chloride 112 (H) 101 - 111 mmol/L   CO2 20 (L) 22 - 32 mmol/L   Glucose, Bld 113 (H) 65 - 99 mg/dL   BUN 19 6 - 20 mg/dL   Creatinine, Ser 0.99 0.44 - 1.00 mg/dL   Calcium 7.9 (L) 8.9 - 10.3 mg/dL   Total Protein 6.3 (L) 6.5 - 8.1 g/dL   Albumin 2.3 (L) 3.5 - 5.0 g/dL   AST 41 15 - 41 U/L   ALT 26 14 - 54 U/L   Alkaline Phosphatase 72 38 - 126 U/L   Total Bilirubin 1.5 (H) 0.3 - 1.2 mg/dL   GFR calc non Af Amer 59 (L) >60 mL/min   GFR calc Af Amer >60 >60 mL/min   Anion gap 7 5 - 15  Glucose, capillary     Status: None   Collection Time: 08/29/16 12:57 PM  Result Value Ref Range   Glucose-Capillary 99 65 - 99 mg/dL   Comment 1 Notify RN      Ct Angio Chest W/cm &/or Wo Cm  Result Date: 08/28/2016 CLINICAL DATA:  66 year old female with a history of shortness of breath EXAM: CT ANGIOGRAPHY CHEST CT ABDOMEN AND PELVIS WITH CONTRAST TECHNIQUE: Multidetector CT imaging of the chest was performed using the standard protocol during bolus administration of intravenous contrast. Multiplanar CT image reconstructions and MIPs were obtained to evaluate the vascular anatomy. Multidetector CT imaging of the abdomen and pelvis  was performed using the standard protocol during bolus administration of intravenous contrast. CONTRAST:  100 cc Isovue 370 COMPARISON:  None. FINDINGS: CTA CHEST FINDINGS Cardiovascular: Heart: No cardiomegaly. No pericardial fluid/thickening. No significant coronary calcifications. Right and left ventricles are somewhat compressed by the large left-sided diaphragmatic herniation. Estimated diameter of the left ventricle measures 20 mm with estimated diameter of the right ventricle measuring 43 mm. Aorta: Unremarkable course, caliber, contour of the thoracic aorta. No aneurysm or dissection flap. No periaortic fluid. Pulmonary arteries: Filling defects of the right-sided pulmonary arteries involving the segmental branches of the right lower lobe, right middle lobe, right  upper lobe. No filling defects within left-sided pulmonary arteries identified. Mediastinum/Nodes: Mediastinal structures somewhat compressed by the diaphragmatic hernia. Lymph nodes are present within the mediastinum, with the lowest peritracheal lymph nodes borderline enlarged. Lungs/Pleura: Right lung with no pleural effusion or confluent airspace disease. Atelectatic changes at the base of the left lung.  No pneumothorax. Small nodule within the right upper lobe (image 30 of series 6). Musculoskeletal: No displaced fracture. Degenerative changes of the spine. Review of the MIP images confirms the above findings. CT ABDOMEN and PELVIS FINDINGS Hepatobiliary: Rounded hypodense lesion of the inferior right liver measures 7 mm (image 22 of series 12) and 10 mm (image 26 of series 12). Unremarkable appearance the gallbladder. Pancreas: Unremarkable appearance of the pancreas. Spleen: Unremarkable appearance of spleen Adrenals/Urinary Tract: Unremarkable appearance the bilateral adrenal glands. No evidence of left-sided or right-sided hydronephrosis. No nephrolithiasis. No perinephric stranding. On the right there is an ill-defined hypodense/ hypoenhancing region at the posterior/ lateral cortex of the kidney measuring 10 mm. Stomach/Bowel: Herniation of the majority of the stomach into the left chest through diaphragmatic hernia. The splenic flexure is herniated into the left chest. No evidence of abnormally distended small bowel or colon. Vascular/Lymphatic: Extensive nodularity of the mesenteric fat associated with the stomach, splenic flexure, and the mesenteric fat herniated into the left chest. Nodular tissue associated with the vasculature. There is circumferential soft tissue surrounding the left gastric artery after the origin from the celiac artery soft tissue essentially in cases the gastric artery just after the origin. Hazy infiltration of the mesenteric fat within the left upper quadrant. Enhancing  peritoneal surfaces in the low abdomen/pelvis, involving nearly all the visualized at adnexa and the mesenteric fat associated with small bowel. Small volume ascites in the abdomen/pelvis. Unremarkable course caliber and contour of the abdominal aorta. No significant atherosclerotic changes. Proximal femoral vasculature patent. Nonocclusive venous thrombus within the left iliac system at the confluence of internal iliac vein in the external iliac vein. Proximal femoral veins unremarkable. Reproductive: Enhancing soft tissue nodularity associated with the right and left adnexa, with the surfaces of the broad ligaments in case with enhancing soft tissue. Other: Enhancing soft tissue within the umbilical region, likely pathologic lymph node. Musculoskeletal: No displaced fracture. No significant degenerative changes of the hips. Review of the MIP images confirms the above findings. IMPRESSION: The study is positive for right-sided pulmonary emboli involving segmental and subsegmental branches. Assessing for heart strain is limited given the distortion secondary to left-sided diaphragmatic hernia. If there is concern for right-sided heart strain, correlation with cardiac ECHO may be useful. Left-sided diaphragmatic hernia, involving majority of the stomach, splenic flexure, and large portion of the associated mesentery. 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 Alternative site of a  presumed malignancy of the left upper quadrant could be the splenic flexure. Correlation with endoscopy and possibly colonoscopy recommended. 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 Enhancing lesion within the  umbilical region, likely metastatic involvement of lymph node. Small hypodense lesions of the right liver, too small to characterize and possibly benign cysts. Correlation with any future PET-CT may be useful. Low-density lesion of the lateral cortex of the right kidney. Differential diagnosis includes kidney infarction, infection, or tumor implant. Small nodule of the right upper lobe measuring 4 mm. Correlation with future imaging recommended. Nonocclusive left iliac DVT. These results were called by telephone at the time of interpretation on 08/28/2016 at 5:52 pm to Dr. Merrily Pew , who verbally acknowledged these results. Signed, Dulcy Fanny. Earleen Newport, DO [facility] Electronically Signed   By: Corrie Mckusick D.O.   On: 08/28/2016 17:57   Ct Abdomen Pelvis W Contrast  Result Date: 08/28/2016 CLINICAL DATA:  66 year old female with a history of shortness of breath EXAM: CT ANGIOGRAPHY CHEST CT ABDOMEN AND PELVIS WITH CONTRAST TECHNIQUE: Multidetector CT imaging of the chest was performed using the standard protocol during bolus administration of intravenous contrast. Multiplanar CT image reconstructions and MIPs were obtained to evaluate the vascular anatomy. Multidetector CT imaging of the abdomen and pelvis was performed using the standard protocol during bolus administration of intravenous contrast. CONTRAST:  100 cc Isovue 370 COMPARISON:  None. FINDINGS: CTA CHEST FINDINGS Cardiovascular: Heart: No cardiomegaly. No pericardial fluid/thickening. No significant coronary calcifications. Right and left ventricles are somewhat compressed by the large left-sided diaphragmatic herniation. Estimated diameter of the left ventricle measures 20 mm with estimated diameter of the right ventricle measuring 43 mm. Aorta: Unremarkable course, caliber, contour of the thoracic aorta. No aneurysm or dissection flap. No periaortic fluid. Pulmonary arteries: Filling  defects of the right-sided pulmonary arteries involving the segmental branches of the right lower lobe, right middle lobe, right upper lobe. No filling defects within left-sided pulmonary arteries identified. Mediastinum/Nodes: Mediastinal structures somewhat compressed by the diaphragmatic hernia. Lymph nodes are present within the mediastinum, with the lowest peritracheal lymph nodes borderline enlarged. Lungs/Pleura: Right lung with no pleural effusion or confluent airspace disease. Atelectatic changes at the base of the left lung.  No pneumothorax. Small nodule within the right upper lobe (image 30 of series 6). Musculoskeletal: No displaced fracture. Degenerative changes of the spine. Review of the MIP images confirms the above findings. CT ABDOMEN and PELVIS FINDINGS Hepatobiliary: Rounded hypodense lesion of the inferior right liver measures 7 mm (image 22 of series 12) and 10 mm (image 26 of series 12). Unremarkable appearance the gallbladder. Pancreas: Unremarkable appearance of the pancreas. Spleen: Unremarkable appearance of spleen Adrenals/Urinary Tract: Unremarkable appearance the bilateral adrenal glands. No evidence of left-sided or right-sided hydronephrosis. No nephrolithiasis. No perinephric stranding. On the right there is an ill-defined hypodense/ hypoenhancing region at the posterior/ lateral cortex of the kidney measuring 10 mm. Stomach/Bowel: Herniation of the majority of the stomach into the left chest through diaphragmatic hernia. The splenic flexure is herniated into the left chest. No evidence of abnormally distended small bowel or colon. Vascular/Lymphatic: Extensive nodularity of the mesenteric fat associated with the stomach, splenic flexure, and the mesenteric fat herniated into the left chest. Nodular tissue associated with the vasculature. There is circumferential soft tissue surrounding the left  gastric artery after the origin from the celiac artery soft tissue essentially in cases  the gastric artery just after the origin. Hazy infiltration of the mesenteric fat within the left upper quadrant. Enhancing peritoneal surfaces in the low abdomen/pelvis, involving nearly all the visualized at adnexa and the mesenteric fat associated with small bowel. Small volume ascites in the abdomen/pelvis. Unremarkable course caliber and contour of the abdominal aorta. No significant atherosclerotic changes. Proximal femoral vasculature patent. Nonocclusive venous thrombus within the left iliac system at the confluence of internal iliac vein in the external iliac vein. Proximal femoral veins unremarkable. Reproductive: Enhancing soft tissue nodularity associated with the right and left adnexa, with the surfaces of the broad ligaments in case with enhancing soft tissue. Other: Enhancing soft tissue within the umbilical region, likely pathologic lymph node. Musculoskeletal: No displaced fracture. No significant degenerative changes of the hips. Review of the MIP images confirms the above findings. IMPRESSION: The study is positive for right-sided pulmonary emboli involving segmental and subsegmental branches. Assessing for heart strain is limited given the distortion secondary to left-sided diaphragmatic hernia. If there is concern for right-sided heart strain, correlation with cardiac ECHO may be useful. Left-sided diaphragmatic hernia, involving majority of the stomach, splenic flexure, and large portion of the associated mesentery. 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 Alternative site of a presumed malignancy of the left upper quadrant could be the splenic flexure. Correlation with endoscopy and possibly colonoscopy recommended. 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 Enhancing lesion within the umbilical region, likely metastatic involvement of lymph node. Small hypodense lesions of the right liver, too small to characterize and possibly benign cysts. Correlation with any future PET-CT may be useful. Low-density lesion of the lateral cortex of the right kidney. Differential diagnosis includes kidney infarction, infection, or tumor implant. Small nodule of the right upper lobe measuring 4 mm. Correlation with future imaging recommended. Nonocclusive left iliac DVT. These results were called by telephone at the time of interpretation on 08/28/2016 at 5:52 pm to Dr. Merrily Pew , who verbally acknowledged these results. Signed, Dulcy Fanny. Earleen Newport, DO [facility] Radiology Electronically Signed   By: Corrie Mckusick D.O.   On: 08/28/2016 17:57   Ir Ivc Filter Plmt / S&i /img Guid/mod Sed  Result Date: 08/29/2016 INDICATION: 66 year old female with pulmonary embolism and left iliac DVT. Risk factor of new malignancy. Anti coagulation contraindicated secondary to presumed gastrointestinal hemorrhage EXAM: ULTRASOUND GUIDED ACCESS RIGHT INTERNAL JUGULAR VEIN. PLACEMENT OF RETRIEVABLE IVC FILTER VIA IJ APPROACH MEDICATIONS: None. ANESTHESIA/SEDATION: 1.5 mg IV Versed; 75 mcg IV Fentanyl Moderate Sedation Time:  10 minutes The patient was continuously monitored during the procedure by the interventional radiology nurse under my direct supervision. FLUOROSCOPY TIME:  Fluoroscopy Time: 0 minutes 36 seconds (37 mGy). COMPLICATIONS: None PROCEDURE: The procedure, risks, benefits, and alternatives were explained to the patient. Specific risks discussed include bleeding, infection, contrast reaction, renal failure, IVC filter fracture, migration, ileo caval thrombus (3% incidence), need for further procedure, need for further surgery, pulmonary  embolism, cardiopulmonary collapse, death. Questions regarding the procedure were encouraged and answered. The patient understands and consents to the procedure. Ultrasound survey was performed with images stored and sent to PACs.  The neck was prepped with Betadine in a sterile fashion, and a sterile drape was applied covering the operative field. A sterile gown and sterile gloves were used for the procedure. Local anesthesia was provided with 1% Lidocaine. A micropuncture needle was used access the right internal jugular vein under ultrasound. With excellent venous blood flow returned, and an .018 micro wire was passed through the needle. Small incision was made with an 11 blade scalpel. The needle was removed, and a micropuncture sheath was placed over the wire. The inner dilator and wire were removed, and an 035 Bentson wire was advanced under fluoroscopy into the IVC. Serial dilation of the soft tissue tract was performed with an 8 [place] dilator and subsequently a 10 [place] dilator. The delivery sheath for a retrievable Bard [place]ali filter was passed over the Bentson wire into the IVC. The wire was removed and small contrast was used to confirm IVC location. IVC cavagram performed. Dilator was removed, and the IVC filter was then delivered, positioned below the lowest renal vein. Repeat cavagram performed, and the catheter was removed. Manual pressure was used for hemostasis. Patient tolerated the procedure well and remained hemodynamically stable throughout. No complications were encountered and no significant blood loss was encounter. IMPRESSION: Status post retrievable IVC filter placement via IJ approach. Signed, Dulcy Fanny. Earleen Newport DO [facility] Radiology PLAN: This IVC filter is potentially retrievable. The patient may be assessed for filter retrieval by Interventional Radiology in approximately 8-12 weeks. Further recommendations regarding filter retrieval,  continued surveillance or declaration of device permanence, can be made at that time. Electronically Signed   By: Corrie Mckusick D.O.   On: 08/29/2016 12:18    ROS:  As stated above in the HPI otherwise negative.  Blood pressure 134/81, pulse 88, temperature 99.5 F (37.5 C), temperature source Oral, resp. rate (!) 23, height 5\' 2"  (1.575 m), weight 62.6 kg (138 lb), SpO2 96 %.    PE: Gen: NAD, Alert and Oriented HEENT:  [place]/AT, EOMI Neck: Supple, no LAD Lungs: CTA Bilaterally CV: RRR without M/G/R ABM: Soft, NTND, +BS Ext: No C/C/E  Assessment/Plan: 1) Gastric mass. 2) Melena. 3) PE.   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  Further evaluation is required with an EGD with biopsies.    Plan: 1) EGD with biopsies tomorrow.  Mardel Grudzien D 08/29/2016, 1:40 PM

## 2016-08-29 NOTE — H&P (Signed)
Chief Complaint: GIB on xarelto for DVT  Referring Physician:Dr. Simonne Maffucci  Supervising Physician: Corrie Mckusick  Patient Status: [facility] - In-pt  HPI: Jodi Collins is a 66 y.o. female who was recently on a plane flight and noted LLE pain and swelling after this. She was found to have a DVT on 08-07-16 and placed on xarelto.  Since then she started having melanotic stools and feeling SOB.  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  She has been transferred here to [facility] for further evaluation and intervention.  IR has been asked to see this patient for an IVC filter to be placed given her DVT and inability to tolerate anticoagulation.  Past Medical History:  Past Medical History:  Diagnosis Date  . DVT (deep venous thrombosis) ([place])     Past Surgical History: History reviewed. No pertinent surgical history.  Family History: Father had stomach cancer  Social History:  reports that she has never smoked. She has never used smokeless tobacco. She reports that she drinks alcohol. She reports that she does not use drugs.  Allergies: No Known Allergies  Medications: Medications reviewed in epic  Please HPI for pertinent positives, otherwise complete 10 system ROS negative.  Mallampati Score: MD Evaluation Airway: WNL Heart: WNL Abdomen: WNL Chest/ Lungs: WNL ASA  Classification: 2 Mallampati/Airway Score: Two  Physical Exam: BP 139/76 (BP Location: Left Arm)   Pulse 91   Temp 98.5 F (36.9 C) (Oral)   Resp (!) 25   Ht _0  (1.575 m)   Wt 138 lb (62.6 kg)   SpO2 98%   BMI 25.24 kg/m  Body mass index is 25.24 kg/m. General: pleasant, WD, WN black female who is laying in bed in NAD HEENT: head is normocephalic, atraumatic.  Sclera are noninjected.  PERRL.  Ears and nose without any  masses or lesions.  Mouth is pink  Heart: regular, rate, and rhythm.  Normal s1,s2. No obvious murmurs, gallops, or rubs noted.  Palpable radial pulses bilaterally Lungs: CTAB, no wheezes, rhonchi, or rales noted.  Respiratory effort nonlabored, O2 in place Abd: soft, NT, ND, +BS, no masses, hernias, or organomegaly that are palpable Psych: A&Ox3 with an appropriate affect.   Labs: Results for orders placed or performed during the hospital encounter of 08/28/16 (from the past 48 hour(s))  Comprehensive metabolic panel     Status: Abnormal   Collection Time: 08/28/16  3:47 PM  Result Value Ref Range   Sodium 138 135 - 145 mmol/L   Potassium 4.4 3.5 - 5.1 mmol/L   Chloride 106 101 - 111 mmol/L   CO2 16 (L) 22 - 32 mmol/L   Glucose, Bld 148 (H) 65 - 99 mg/dL   BUN 21 (H) 6 - 20 mg/dL   Creatinine, Ser 1.14 (H) 0.44 - 1.00 mg/dL   Calcium 8.3 (L) 8.9 - 10.3 mg/dL   Total Protein 7.1 6.5 - 8.1 g/dL   Albumin 2.6 (L) 3.5 - 5.0 g/dL   AST 54 (H) 15 - 41 U/L   ALT 32 14 - 54 U/L   Alkaline Phosphatase 77 38 - 126 U/L   Total Bilirubin 0.6 0.3 - 1.2 mg/dL   GFR calc non Af Amer 50 (L) >60 mL/min   GFR calc Af Amer 58 (L) >60 mL/min  Comment: (NOTE) The eGFR has been calculated using the CKD EPI equation. This calculation has not been validated in all clinical situations. eGFR's persistently <60 mL/min signify possible Chronic Kidney Disease.    Anion gap 16 (H) 5 - 15  CBC WITH DIFFERENTIAL     Status: Abnormal   Collection Time: 08/28/16  3:47 PM  Result Value Ref Range   WBC 13.2 (H) 4.0 - 10.5 K/uL    Comment: ADJUSTED FOR NUCLEATED RBC'S WHITE COUNT CONFIRMED ON SMEAR    RBC 1.84 (L) 3.87 - 5.11 MIL/uL    Comment: ANISOCYTES SCHISTOCYTES NOTED ON SMEAR MARKED POLYCHROMASIA    Hemoglobin 4.4 (LL) 12.0 - 15.0 g/dL    Comment: REPEATED TO VERIFY CRITICAL RESULT CALLED TO, READ BACK BY AND VERIFIED WITH: CREWS,AMANDA @ 1638 ON 6.11.18 BY BAYSE,L    HCT 15.5 (L) 36.0 -  46.0 %   MCV 84.2 78.0 - 100.0 fL   MCH 23.9 (L) 26.0 - 34.0 pg   MCHC 28.4 (L) 30.0 - 36.0 g/dL   RDW 21.8 (H) 11.5 - 15.5 %   Platelets 180 150 - 400 K/uL    Comment: SPECIMEN CHECKED FOR CLOTS PLATELET COUNT CONFIRMED BY SMEAR    Neutrophils Relative % 84 %   Neutro Abs 11.1 (H) 1.7 - 7.7 K/uL   Lymphocytes Relative 12 %   Lymphs Abs 1.6 0.7 - 4.0 K/uL   Monocytes Relative 3 %   Monocytes Absolute 0.4 0.1 - 1.0 K/uL   Eosinophils Relative 0 %   Eosinophils Absolute 0.0 0.0 - 0.7 K/uL   Basophils Relative 0 %   Basophils Absolute 0.0 0.0 - 0.1 K/uL  Brain natriuretic peptide     Status: Abnormal   Collection Time: 08/28/16  3:47 PM  Result Value Ref Range   B Natriuretic Peptide 347.0 (H) 0.0 - 100.0 pg/mL  Troponin I     Status: Abnormal   Collection Time: 08/28/16  3:47 PM  Result Value Ref Range   Troponin I 0.22 (HH) <0.03 ng/mL    Comment: CRITICAL RESULT CALLED TO, READ BACK BY AND VERIFIED WITH: PATRAW,B AT 1630 ON 6.11.2018 BY ISLEY,B   Protime-INR     Status: Abnormal   Collection Time: 08/28/16  3:47 PM  Result Value Ref Range   Prothrombin Time 18.3 (H) 11.4 - 15.2 seconds   INR 1.50   Vitamin B12     Status: Abnormal   Collection Time: 08/28/16  4:42 PM  Result Value Ref Range   Vitamin B-12 5,758 (H) 180 - 914 pg/mL    Comment: (NOTE) This assay is not validated for testing neonatal or myeloproliferative syndrome specimens for Vitamin B12 levels. Performed at [facility], [place] [address], [place], [place] [address]   Iron and TIBC     Status: Abnormal   Collection Time: 08/28/16  4:42 PM  Result Value Ref Range   Iron 16 (L) 28 - 170 ug/dL   TIBC 262 250 - 450 ug/dL   Saturation Ratios 6 (L) 10.4 - 31.8 %   UIBC 246 ug/dL    Comment: Performed at [facility] [address] [place], [place] [address]  Ferritin     Status: None   Collection Time: 08/28/16  4:42 PM  Result Value Ref Range   Ferritin 26 11 - 307 ng/mL    Comment:  Performed at [facility] [address], [place], [place] [address]  Reticulocytes  Status: Abnormal   Collection Time: 08/28/16  4:42 PM  Result Value Ref Range   Retic Ct Pct 12.9 (H) 0.4 - 3.1 %   RBC. 1.79 (L) 3.87 - 5.11 MIL/uL   Retic Count, Manual 230.9 (H) 19.0 - 186.0 K/uL  APTT     Status: None   Collection Time: 08/28/16  4:42 PM  Result Value Ref Range   aPTT 28 24 - 36 seconds  Heparin level (unfractionated)     Status: Abnormal   Collection Time: 08/28/16  4:42 PM  Result Value Ref Range   Heparin Unfractionated <0.10 (L) 0.30 - 0.70 IU/mL    Comment:        IF HEPARIN RESULTS ARE BELOW EXPECTED VALUES, AND PATIENT DOSAGE HAS BEEN CONFIRMED, SUGGEST FOLLOW UP TESTING OF ANTITHROMBIN III LEVELS.   Type and screen     Status: None (Preliminary result)   Collection Time: 08/28/16  5:36 PM  Result Value Ref Range   ABO/RH(D) A POS    Antibody Screen NEG    Sample Expiration 08/31/2016    Unit Number [id number]    Blood Component Type RED CELLS,LR    Unit division 00    Status of Unit ALLOCATED    Transfusion Status OK TO TRANSFUSE    Crossmatch Result Compatible    Unit Number [id number]    Blood Component Type RED CELLS,LR    Unit division 00    Status of Unit ISSUED,FINAL    Transfusion Status OK TO TRANSFUSE    Crossmatch Result Compatible   Prepare RBC     Status: None   Collection Time: 08/28/16  5:36 PM  Result Value Ref Range   Order Confirmation ORDER PROCESSED BY BLOOD BANK   Folate     Status: None   Collection Time: 08/28/16  5:40 PM  Result Value Ref Range   Folate 18.4 >5.9 ng/mL    Comment: Performed at [facility], [place] [address] [place], [place] [address]  ABO/Rh     Status: None   Collection Time: 08/28/16  5:43 PM  Result Value Ref Range   ABO/RH(D) A POS   MRSA PCR Screening     Status: None   Collection Time: 08/28/16  8:50 PM  Result Value Ref Range   MRSA by PCR NEGATIVE NEGATIVE    Comment:        The  GeneXpert MRSA Assay (FDA approved for NASAL specimens only), is one component of a comprehensive MRSA colonization surveillance program. It is not intended to diagnose MRSA infection nor to guide or monitor treatment for MRSA infections.   CBC     Status: Abnormal   Collection Time: 08/28/16 11:01 PM  Result Value Ref Range   WBC 12.5 (H) 4.0 - 10.5 K/uL    Comment: WHITE COUNT CONFIRMED ON SMEAR   RBC 2.18 (L) 3.87 - 5.11 MIL/uL    Comment: SCHISTOCYTES NOTED ON SMEAR   Hemoglobin 5.2 (LL) 12.0 - 15.0 g/dL    Comment: REPEATED TO VERIFY CRITICAL RESULT CALLED TO, READ BACK BY AND VERIFIED WITH: J.DUNN,RN 2346 08/28/16 M.CAMPBELL    HCT 17.6 (L) 36.0 - 46.0 %   MCV 80.7 78.0 - 100.0 fL   MCH 23.9 (L) 26.0 - 34.0 pg   MCHC 29.5 (L) 30.0 - 36.0 g/dL   RDW 18.3 (H) 11.5 - 15.5 %   Platelets 117 (L) 150 - 400 K/uL    Comment: PLATELET COUNT CONFIRMED BY SMEAR  Glucose, capillary  Status: Abnormal   Collection Time: 08/28/16 11:54 PM  Result Value Ref Range   Glucose-Capillary 116 (H) 65 - 99 mg/dL   Comment 1 Capillary Specimen   Urinalysis, Routine w reflex microscopic     Status: Abnormal   Collection Time: 08/29/16 12:00 AM  Result Value Ref Range   Color, Urine YELLOW YELLOW   APPearance CLEAR CLEAR   Specific Gravity, Urine >1.046 (H) 1.005 - 1.030   pH 6.0 5.0 - 8.0   Glucose, UA NEGATIVE NEGATIVE mg/dL   Hgb urine dipstick NEGATIVE NEGATIVE   Bilirubin Urine NEGATIVE NEGATIVE   Ketones, ur NEGATIVE NEGATIVE mg/dL   Protein, ur NEGATIVE NEGATIVE mg/dL   Nitrite NEGATIVE NEGATIVE   Leukocytes, UA MODERATE (A) NEGATIVE   RBC / HPF 0-5 0 - 5 RBC/hpf   WBC, UA 0-5 0 - 5 WBC/hpf   Bacteria, UA RARE (A) NONE SEEN   Squamous Epithelial / LPF 0-5 (A) NONE SEEN   Mucous PRESENT   Magnesium     Status: None   Collection Time: 08/29/16  1:54 AM  Result Value Ref Range   Magnesium 2.2 1.7 - 2.4 mg/dL  Phosphorus     Status: None   Collection Time: 08/29/16  1:54 AM   Result Value Ref Range   Phosphorus 3.7 2.5 - 4.6 mg/dL  Lactic acid, plasma     Status: None   Collection Time: 08/29/16  1:54 AM  Result Value Ref Range   Lactic Acid, Venous 0.9 0.5 - 1.9 mmol/L  Type and screen [place]     Status: None (Preliminary result)   Collection Time: 08/29/16  1:58 AM  Result Value Ref Range   ABO/RH(D) A POS    Antibody Screen NEG    Sample Expiration 09/01/2016    Unit Number [id number]    Blood Component Type RED CELLS,LR    Unit division 00    Status of Unit ISSUED    Transfusion Status OK TO TRANSFUSE    Crossmatch Result Compatible    Unit Number [id number]    Blood Component Type RED CELLS,LR    Unit division 00    Status of Unit ISSUED    Transfusion Status OK TO TRANSFUSE    Crossmatch Result Compatible    Unit Number [id number]    Blood Component Type RED CELLS,LR    Unit division 00    Status of Unit ISSUED    Transfusion Status OK TO TRANSFUSE    Crossmatch Result Compatible   ABO/Rh     Status: None   Collection Time: 08/29/16  1:58 AM  Result Value Ref Range   ABO/RH(D) A POS   Prepare RBC     Status: None   Collection Time: 08/29/16  2:39 AM  Result Value Ref Range   Order Confirmation ORDER PROCESSED BY BLOOD BANK   Glucose, capillary     Status: Abnormal   Collection Time: 08/29/16  3:59 AM  Result Value Ref Range   Glucose-Capillary 133 (H) 65 - 99 mg/dL   Comment 1 Capillary Specimen   Glucose, capillary     Status: Abnormal   Collection Time: 08/29/16  8:43 AM  Result Value Ref Range   Glucose-Capillary 114 (H) 65 - 99 mg/dL   Comment 1 Notify RN     Imaging: Ct Angio Chest W/cm &/or Wo Cm  Result Date: 08/28/2016 CLINICAL DATA:  66 year old female with a history of shortness of breath EXAM: CT ANGIOGRAPHY CHEST CT ABDOMEN  AND PELVIS WITH CONTRAST TECHNIQUE: Multidetector CT imaging of the chest was performed using the standard protocol during bolus administration of intravenous  contrast. Multiplanar CT image reconstructions and MIPs were obtained to evaluate the vascular anatomy. Multidetector CT imaging of the abdomen and pelvis was performed using the standard protocol during bolus administration of intravenous contrast. CONTRAST:  100 cc Isovue 370 COMPARISON:  None. FINDINGS: CTA CHEST FINDINGS Cardiovascular: Heart: No cardiomegaly. No pericardial fluid/thickening. No significant coronary calcifications. Right and left ventricles are somewhat compressed by the large left-sided diaphragmatic herniation. Estimated diameter of the left ventricle measures 20 mm with estimated diameter of the right ventricle measuring 43 mm. Aorta: Unremarkable course, caliber, contour of the thoracic aorta. No aneurysm or dissection flap. No periaortic fluid. Pulmonary arteries: Filling defects of the right-sided pulmonary arteries involving the segmental branches of the right lower lobe, right middle lobe, right upper lobe. No filling defects within left-sided pulmonary arteries identified. Mediastinum/Nodes: Mediastinal structures somewhat compressed by the diaphragmatic hernia. Lymph nodes are present within the mediastinum, with the lowest peritracheal lymph nodes borderline enlarged. Lungs/Pleura: Right lung with no pleural effusion or confluent airspace disease. Atelectatic changes at the base of the left lung.  No pneumothorax. Small nodule within the right upper lobe (image 30 of series 6). Musculoskeletal: No displaced fracture. Degenerative changes of the spine. Review of the MIP images confirms the above findings. CT ABDOMEN and PELVIS FINDINGS Hepatobiliary: Rounded hypodense lesion of the inferior right liver measures 7 mm (image 22 of series 12) and 10 mm (image 26 of series 12). Unremarkable appearance the gallbladder. Pancreas: Unremarkable appearance of the pancreas. Spleen: Unremarkable appearance of spleen Adrenals/Urinary Tract: Unremarkable appearance the bilateral adrenal glands. No  evidence of left-sided or right-sided hydronephrosis. No nephrolithiasis. No perinephric stranding. On the right there is an ill-defined hypodense/ hypoenhancing region at the posterior/ lateral cortex of the kidney measuring 10 mm. Stomach/Bowel: Herniation of the majority of the stomach into the left chest through diaphragmatic hernia. The splenic flexure is herniated into the left chest. No evidence of abnormally distended small bowel or colon. Vascular/Lymphatic: Extensive nodularity of the mesenteric fat associated with the stomach, splenic flexure, and the mesenteric fat herniated into the left chest. Nodular tissue associated with the vasculature. There is circumferential soft tissue surrounding the left gastric artery after the origin from the celiac artery soft tissue essentially in cases the gastric artery just after the origin. Hazy infiltration of the mesenteric fat within the left upper quadrant. Enhancing peritoneal surfaces in the low abdomen/pelvis, involving nearly all the visualized at adnexa and the mesenteric fat associated with small bowel. Small volume ascites in the abdomen/pelvis. Unremarkable course caliber and contour of the abdominal aorta. No significant atherosclerotic changes. Proximal femoral vasculature patent. Nonocclusive venous thrombus within the left iliac system at the confluence of internal iliac vein in the external iliac vein. Proximal femoral veins unremarkable. Reproductive: Enhancing soft tissue nodularity associated with the right and left adnexa, with the surfaces of the broad ligaments in case with enhancing soft tissue. Other: Enhancing soft tissue within the umbilical region, likely pathologic lymph node. Musculoskeletal: No displaced fracture. No significant degenerative changes of the hips. Review of the MIP images confirms the above findings. IMPRESSION: The study is positive for right-sided pulmonary emboli involving segmental and subsegmental branches. Assessing  for heart strain is limited given the distortion secondary to left-sided diaphragmatic hernia. If there is concern for right-sided heart strain, correlation with cardiac ECHO may be useful. Left-sided diaphragmatic hernia,  involving majority of the stomach, splenic flexure, and large portion of the associated mesentery. 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 Alternative site of a presumed malignancy of the left upper quadrant could be the splenic flexure. Correlation with endoscopy and possibly colonoscopy recommended. 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 Enhancing lesion within the umbilical region, likely metastatic involvement of lymph node. Small hypodense lesions of the right liver, too small to characterize and possibly benign cysts. Correlation with any future PET-CT may be useful. Low-density lesion of the lateral cortex of the right kidney. Differential diagnosis includes kidney infarction, infection, or tumor implant. Small nodule of the right upper lobe measuring 4 mm. Correlation with future imaging recommended. Nonocclusive left iliac DVT. These results were called by telephone at the time of interpretation on 08/28/2016 at 5:52 pm to Dr. Merrily Pew , who verbally acknowledged these results. Signed, Dulcy Fanny. Earleen Newport, DO [facility] Electronically Signed   By: Corrie Mckusick D.O.   On: 08/28/2016 17:57   Ct Abdomen Pelvis W Contrast  Result Date: 08/28/2016 CLINICAL DATA:  66 year old female with a history of shortness of breath EXAM: CT ANGIOGRAPHY CHEST CT ABDOMEN AND PELVIS WITH CONTRAST TECHNIQUE:  Multidetector CT imaging of the chest was performed using the standard protocol during bolus administration of intravenous contrast. Multiplanar CT image reconstructions and MIPs were obtained to evaluate the vascular anatomy. Multidetector CT imaging of the abdomen and pelvis was performed using the standard protocol during bolus administration of intravenous contrast. CONTRAST:  100 cc Isovue 370 COMPARISON:  None. FINDINGS: CTA CHEST FINDINGS Cardiovascular: Heart: No cardiomegaly. No pericardial fluid/thickening. No significant coronary calcifications. Right and left ventricles are somewhat compressed by the large left-sided diaphragmatic herniation. Estimated diameter of the left ventricle measures 20 mm with estimated diameter of the right ventricle measuring 43 mm. Aorta: Unremarkable course, caliber, contour of the thoracic aorta. No aneurysm or dissection flap. No periaortic fluid. Pulmonary arteries: Filling defects of the right-sided pulmonary arteries involving the segmental branches of the right lower lobe, right middle lobe, right upper lobe. No filling defects within left-sided pulmonary arteries identified. Mediastinum/Nodes: Mediastinal structures somewhat compressed by the diaphragmatic hernia. Lymph nodes are present within the mediastinum, with the lowest peritracheal lymph nodes borderline enlarged. Lungs/Pleura: Right lung with no pleural effusion or confluent airspace disease. Atelectatic changes at the base of the left lung.  No pneumothorax. Small nodule within the right upper lobe (image 30 of series 6). Musculoskeletal: No displaced fracture. Degenerative changes of the spine. Review of the MIP images confirms the above findings. CT ABDOMEN and PELVIS FINDINGS Hepatobiliary: Rounded hypodense lesion of the inferior right liver measures 7 mm (image 22 of series 12) and 10 mm (image 26 of series 12). Unremarkable appearance the gallbladder. Pancreas: Unremarkable appearance of the pancreas.  Spleen: Unremarkable appearance of spleen Adrenals/Urinary Tract: Unremarkable appearance the bilateral adrenal glands. No evidence of left-sided or right-sided hydronephrosis. No nephrolithiasis. No perinephric stranding. On the right there is an ill-defined hypodense/ hypoenhancing region at the posterior/ lateral cortex of the kidney measuring 10 mm. Stomach/Bowel: Herniation of the majority of the stomach into the left chest through diaphragmatic hernia. The splenic flexure is herniated into the left chest.  No evidence of abnormally distended small bowel or colon. Vascular/Lymphatic: Extensive nodularity of the mesenteric fat associated with the stomach, splenic flexure, and the mesenteric fat herniated into the left chest. Nodular tissue associated with the vasculature. There is circumferential soft tissue surrounding the left gastric artery after the origin from the celiac artery soft tissue essentially in cases the gastric artery just after the origin. Hazy infiltration of the mesenteric fat within the left upper quadrant. Enhancing peritoneal surfaces in the low abdomen/pelvis, involving nearly all the visualized at adnexa and the mesenteric fat associated with small bowel. Small volume ascites in the abdomen/pelvis. Unremarkable course caliber and contour of the abdominal aorta. No significant atherosclerotic changes. Proximal femoral vasculature patent. Nonocclusive venous thrombus within the left iliac system at the confluence of internal iliac vein in the external iliac vein. Proximal femoral veins unremarkable. Reproductive: Enhancing soft tissue nodularity associated with the right and left adnexa, with the surfaces of the broad ligaments in case with enhancing soft tissue. Other: Enhancing soft tissue within the umbilical region, likely pathologic lymph node. Musculoskeletal: No displaced fracture. No significant degenerative changes of the hips. Review of the MIP images confirms the above findings.  IMPRESSION: The study is positive for right-sided pulmonary emboli involving segmental and subsegmental branches. Assessing for heart strain is limited given the distortion secondary to left-sided diaphragmatic hernia. If there is concern for right-sided heart strain, correlation with cardiac ECHO may be useful. Left-sided diaphragmatic hernia, involving majority of the stomach, splenic flexure, and large portion of the associated mesentery. 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 Alternative site of a presumed malignancy of the left upper quadrant could be the splenic flexure. Correlation with endoscopy and possibly colonoscopy recommended. 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 Enhancing lesion within the umbilical region, likely metastatic involvement of lymph node. Small hypodense lesions of the right liver, too small to characterize and possibly benign cysts. Correlation with any future PET-CT may be useful. Low-density lesion of the lateral cortex of the right kidney. Differential diagnosis includes kidney infarction, infection, or tumor implant. Small nodule of the right upper lobe measuring 4 mm. Correlation with future imaging recommended. Nonocclusive left iliac DVT. These results were called by telephone at the time of interpretation on 08/28/2016 at 5:52 pm to Dr. Merrily Pew , who verbally acknowledged these results. Signed, Dulcy Fanny. Earleen Newport, DO [facility] Electronically Signed   By: Corrie Mckusick D.O.   On: 08/28/2016 17:57    Assessment/Plan 1. LLE DVT with PE  2. GI bleed, likely secondary to gastric  mass  We will plan for placement of an IVC filter today given the patient's inability to tolerate anticoagulation for her DVT/PE.  Her labs and vitals have been reviewed.  For now, we will plan for this filter to be temporary, but given the concern for gastric malignancy and possible metastatic disease this may be something that needs to be permanent. Risks and Benefits discussed with the patient including, but not limited to bleeding, infection, contrast induced renal failure, filter fracture or migration which can lead to emergency surgery or even death, strut penetration with damage or irritation to adjacent structures and caval thrombosis. All of the patient's questions were  answered, patient is agreeable to proceed. Consent signed and in chart.  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  Thank you for this interesting consult.  I greatly enjoyed meeting Jodi Collins and look forward to participating in their care.  A copy of this report was sent to the requesting provider on this date.  Electronically Signed: Henreitta Cea 08/29/2016, 9:04 AM   I spent a total of 40 Minutes    in face to face in clinical consultation, greater than 50% of which was counseling/coordinating care for LLE DVT/PE, gi bleed

## 2016-08-29 NOTE — Progress Notes (Signed)
2D Echocardiogram has been performed.  Jodi Collins 08/29/2016, 3:12 PM

## 2016-08-29 NOTE — Progress Notes (Signed)
eLink Physician-Brief Progress Note Patient Name: Jodi Collins DOB: 1951/01/27 MRN: [id number]   Date of Service  08/29/2016  HPI/Events of Note  Hb up from 4 to 5.2 after 1 U  eICU Interventions  Transfuse 2 additional units , then recheck     Intervention Category Intermediate Interventions: Bleeding - evaluation and treatment with blood products  Jodi Collins V. 08/29/2016, 1:09 AM

## 2016-08-29 NOTE — Progress Notes (Signed)
CRITICAL VALUE ALERT  Critical Value:  Hbg 5.2  Date & Time Notied: 08/28/16 2345  Provider Notified: Warren Lacy RN  Orders Received/Actions taken: Value consistent with previous result, orders already in place to transfuse 2nd PRBC.

## 2016-08-30 ENCOUNTER — Encounter (HOSPITAL_COMMUNITY): Payer: Self-pay

## 2016-08-30 ENCOUNTER — Encounter (HOSPITAL_COMMUNITY)
Admission: EM | Disposition: A | Discharge status: Home / Self Care | Payer: Self-pay | Source: Home / Self Care | Attending: Pulmonary Disease

## 2016-08-30 DIAGNOSIS — R1900 Intra-abdominal and pelvic swelling, mass and lump, unspecified site: Secondary | ICD-10-CM

## 2016-08-30 HISTORY — PX: ESOPHAGOGASTRODUODENOSCOPY: SHX5428

## 2016-08-30 LAB — TYPE AND SCREEN
ABO/RH(D): A POS
Antibody Screen: NEGATIVE
UNIT DIVISION: 0
UNIT DIVISION: 0
Unit division: 0

## 2016-08-30 LAB — CBC
HCT: 31.1 % — ABNORMAL LOW (ref 36.0–46.0)
Hemoglobin: 10 g/dL — ABNORMAL LOW (ref 12.0–15.0)
MCH: 26.6 pg (ref 26.0–34.0)
MCHC: 32.2 g/dL (ref 30.0–36.0)
MCV: 82.7 fL (ref 78.0–100.0)
PLATELETS: 82 10*3/uL — AB (ref 150–400)
RBC: 3.76 MIL/uL — AB (ref 3.87–5.11)
RDW: 19.2 % — AB (ref 11.5–15.5)
WBC: 13.1 10*3/uL — AB (ref 4.0–10.5)

## 2016-08-30 LAB — BPAM RBC
Blood Product Expiration Date: 201806252359
Blood Product Expiration Date: 201806252359
Blood Product Expiration Date: 201806252359
ISSUE DATE / TIME: 201806120256
ISSUE DATE / TIME: 201806120431
ISSUE DATE / TIME: 201806120555
UNIT TYPE AND RH: 6200
Unit Type and Rh: 6200
Unit Type and Rh: 6200

## 2016-08-30 LAB — BASIC METABOLIC PANEL
Anion gap: 7 (ref 5–15)
BUN: 18 mg/dL (ref 6–20)
CALCIUM: 8.1 mg/dL — AB (ref 8.9–10.3)
CO2: 19 mmol/L — ABNORMAL LOW (ref 22–32)
CREATININE: 0.99 mg/dL (ref 0.44–1.00)
Chloride: 111 mmol/L (ref 101–111)
GFR calc Af Amer: >60 mL/min (ref >60)
GFR calc non Af Amer: 59 mL/min — ABNORMAL LOW (ref >60)
Glucose, Bld: 105 mg/dL — ABNORMAL HIGH (ref 65–99)
POTASSIUM: 4.1 mmol/L (ref 3.5–5.1)
SODIUM: 137 mmol/L (ref 135–145)

## 2016-08-30 LAB — APTT: aPTT: 31 seconds (ref 24–36)

## 2016-08-30 LAB — GLUCOSE, CAPILLARY
GLUCOSE-CAPILLARY: 94 mg/dL (ref 65–99)
GLUCOSE-CAPILLARY: 112 mg/dL — AB (ref 65–99)
GLUCOSE-CAPILLARY: 116 mg/dL — AB (ref 65–99)
Glucose-Capillary: 102 mg/dL — ABNORMAL HIGH (ref 65–99)
Glucose-Capillary: 108 mg/dL — ABNORMAL HIGH (ref 65–99)
Glucose-Capillary: 120 mg/dL — ABNORMAL HIGH (ref 65–99)

## 2016-08-30 LAB — OCCULT BLOOD X 1 CARD TO LAB, STOOL: Fecal Occult Bld: POSITIVE — AB

## 2016-08-30 LAB — PROTIME-INR
INR: 1.37
PROTHROMBIN TIME: 17 s — AB (ref 11.4–15.2)

## 2016-08-30 SURGERY — EGD (ESOPHAGOGASTRODUODENOSCOPY)
Anesthesia: Moderate Sedation

## 2016-08-30 MED ORDER — FENTANYL CITRATE (PF) 100 MCG/2ML IJ SOLN
INTRAMUSCULAR | Status: AC
  Filled 2016-08-30: qty 2

## 2016-08-30 MED ORDER — MIDAZOLAM HCL 10 MG/2ML IJ SOLN
INTRAMUSCULAR | Status: DC | PRN
  Administered 2016-08-30: 1 mg via INTRAVENOUS
  Administered 2016-08-30: 2 mg via INTRAVENOUS
  Administered 2016-08-30: 1 mg via INTRAVENOUS

## 2016-08-30 MED ORDER — DIPHENHYDRAMINE HCL 50 MG/ML IJ SOLN
INTRAMUSCULAR | Status: AC
  Filled 2016-08-30: qty 1

## 2016-08-30 MED ORDER — MIDAZOLAM HCL 5 MG/ML IJ SOLN
INTRAMUSCULAR | Status: AC
  Filled 2016-08-30: qty 2

## 2016-08-30 MED ORDER — FENTANYL CITRATE (PF) 100 MCG/2ML IJ SOLN
INTRAMUSCULAR | Status: DC | PRN
  Administered 2016-08-30: 25 ug via INTRAVENOUS

## 2016-08-30 NOTE — Interval H&P Note (Signed)
History and Physical Interval Note:  08/30/2016 7:28 AM  Jodi Collins  has presented today for surgery, with the diagnosis of Gastric mass  The various methods of treatment have been discussed with the patient and family. After consideration of risks, benefits and other options for treatment, the patient has consented to  Procedure(s): ESOPHAGOGASTRODUODENOSCOPY (EGD) (N/A) as a surgical intervention .  The patient's history has been reviewed, patient examined, no change in status, stable for surgery.  I have reviewed the patient's chart and labs.  Questions were answered to the patient's satisfaction.     Nettie Cromwell D

## 2016-08-30 NOTE — Progress Notes (Signed)
PULMONARY / CRITICAL CARE MEDICINE   Name: Jodi Collins MRN: [id number] DOB: [birth year]    ADMISSION DATE:  08/28/2016 CONSULTATION DATE:  08/28/2016  REFERRING MD:  Dr. Dayna Barker EDP  CHIEF COMPLAINT:  Dyspnea  HISTORY OF PRESENT ILLNESS:   66 year old female with PMH as below, which is significant for recent dx left DVT started on Xarelto after a long flight. She presented [facility] ED [facility]/11 with complaints of SOB x several days with associated cough, leg pain, and leg swelling. Upon arrival to the ED she was markedly dyspneic despite the application of 4L O2 via [place]. There was obviously concern for PE and she was started on heparin. CTA did demonstrate R sided pulmonary emboli, however, lab work resulted and was significant for hemoglobin of 4.4. Heparin infusion was stopped and she was set up for blood transfusion. Other significant lab workup includes CO2 16, creatinine 1.14, BMP 347, Troponin 0.22, WBC 13.2,  plt 180, INR 1.5. 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 She was transferred to [facility] for ICU admission. PCCM to admit.   Reports Stop Taking Xarelto after one week due to increase in dyspnea. Additionally, reports that for about the last week bowel movements have been almost black. States for the last few days has been dizzy and felt weak.    SUBJECTIVE:  Denies pain, reports improvement in dyspnea   VITAL SIGNS: BP 110/64   Pulse 90   Temp 97.8 F (36.6 C) (Oral)   Resp 17   Ht 5\' 2"  (1.575 m)   Wt 138 lb (62.6 kg)   SpO2 97%   BMI 25.24 kg/m   HEMODYNAMICS:    VENTILATOR SETTINGS:    INTAKE / OUTPUT: I/O last 3 completed shifts: In: 1647.3 [I.V.:333.3; Blood:1314] Out: 650 [Urine:650]  PHYSICAL EXAMINATION: General:  WNWD AAF NAD @ rest HEENT: MM pink/moist, no jvd/lan PSY: Awake and interactive Neuro: Alert and interactive, moving all ext to command CV: RRR,  Nl S1/S2, -M/R/G. PULM: even/non-labored, lungs bilaterally  clear GI:Non tender, dark stools reported, +bs Extremities: warm/dry, No edema  Skin: no rashes or lesions  LABS:  BMET  Recent Labs Lab 08/28/16 1547 08/29/16 1143 08/30/16 0001  NA 138 139 137  K 4.4 4.3 4.1  CL 106 112* 111  CO2 16* 20* 19*  BUN 21* 19 18  CREATININE 1.14* 0.99 0.99  GLUCOSE 148* 113* 105*    Electrolytes  Recent Labs Lab 08/28/16 1547 08/29/16 0154 08/29/16 1143 08/30/16 0001  CALCIUM 8.3*  --  7.9* 8.1*  MG  --  2.2  --   --   PHOS  --  3.7  --   --     CBC  Recent Labs Lab 08/29/16 1143 08/29/16 1825 08/30/16 0001  WBC 14.0* 14.0* 13.1*  HGB 10.1* 10.4* 10.0*  HCT 31.8* 32.7* 31.1*  PLT 89* 88* 82*    Coag's  Recent Labs Lab 08/28/16 1547 08/28/16 1642 08/30/16 0001  APTT  --  28 31  INR 1.50  --  1.37    Sepsis Markers  Recent Labs Lab 08/29/16 0154  LATICACIDVEN 0.9    ABG No results for input(s): PHART, PCO2ART, PO2ART in the last 168 hours.  Liver Enzymes  Recent Labs Lab 08/28/16 1547 08/29/16 1143  AST 54* 41  ALT 32 26  ALKPHOS 77 72  BILITOT 0.6 1.5*  ALBUMIN 2.6* 2.3*  Cardiac Enzymes  Recent Labs Lab 08/28/16 1547  TROPONINI 0.22*    Glucose  Recent Labs Lab 08/29/16 0843 08/29/16 1257 08/29/16 1706 08/29/16 2044 08/30/16 0023 08/30/16 0411  GLUCAP 114* 99 100* 100* 108* 102*    Imaging Ir Ivc Filter Plmt / S&i /img Guid/mod Sed  Result Date: 08/29/2016 INDICATION: 66 year old female with pulmonary embolism and left iliac DVT. Risk factor of new malignancy. Anti coagulation contraindicated secondary to presumed gastrointestinal hemorrhage EXAM: ULTRASOUND GUIDED ACCESS RIGHT INTERNAL JUGULAR VEIN. PLACEMENT OF RETRIEVABLE IVC FILTER VIA IJ APPROACH MEDICATIONS: None. ANESTHESIA/SEDATION: 1.5 mg IV Versed; 75 mcg IV Fentanyl Moderate Sedation Time:  10 minutes The patient was continuously monitored during the  procedure by the interventional radiology nurse under my direct supervision. FLUOROSCOPY TIME:  Fluoroscopy Time: 0 minutes 36 seconds (37 mGy). COMPLICATIONS: None PROCEDURE: The procedure, risks, benefits, and alternatives were explained to the patient. Specific risks discussed include bleeding, infection, contrast reaction, renal failure, IVC filter fracture, migration, ileo caval thrombus (3% incidence), need for further procedure, need for further surgery, pulmonary embolism, cardiopulmonary collapse, death. Questions regarding the procedure were encouraged and answered. The patient understands and consents to the procedure. Ultrasound survey was performed with images stored and sent to PACs. The neck was prepped with Betadine in a sterile fashion, and a sterile drape was applied covering the operative field. A sterile gown and sterile gloves were used for the procedure. Local anesthesia was provided with 1% Lidocaine. A micropuncture needle was used access the right internal jugular vein under ultrasound. With excellent venous blood flow returned, and an .018 micro wire was passed through the needle. Small incision was made with an 11 blade scalpel. The needle was removed, and a micropuncture sheath was placed over the wire. The inner dilator and wire were removed, and an 035 Bentson wire was advanced under fluoroscopy into the IVC. Serial dilation of the soft tissue tract was performed with an 8 [place] dilator and subsequently a 10 [place] dilator. The delivery sheath for a retrievable Bard Denali filter was passed over the Bentson wire into the IVC. The wire was removed and small contrast was used to confirm IVC location. IVC cavagram performed. Dilator was removed, and the IVC filter was then delivered, positioned below the lowest renal vein. Repeat cavagram performed, and the catheter was removed. Manual pressure was used for hemostasis. Patient tolerated the procedure well and remained hemodynamically  stable throughout. No complications were encountered and no significant blood loss was encounter. IMPRESSION: Status post retrievable IVC filter placement via IJ approach. Signed, Dulcy Fanny. Earleen Newport DO [facility] Rad[facility] PLAN: This IVC filter is potentially retrievable. The patient may be assessed for filter retrieval by Interventional Radiology in approximately 8-12 weeks. Further recommendations regarding filter retrieval, continued surveillance or declaration of device permanence, can be made at that time. Electronically Signed   By: Corrie Mckusick D.O.   On: 08/29/2016 12:18     STUDIES:  CTA chest and abdomen 6/11 > Right sided pulmonary emboli involving segmental and sub-segmental branches. Could not assess for heart strain due to large diaphragmatic hernia. 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 Enhancing lesion within the umbilical  region, likely metastatic involvement of lymph node.  CULTURES: None  ANTIBIOTICS: None  SIGNIFICANT EVENTS: 6/11 > Presents to ED with Dyspnea  6/12 4 units PRBC 6/12 for IVC filter 6/12 CT abd ? Gastric cancer 6/12 GI consulted, EGD showed malignant appearing esophageal mass at [facility] and gastric mass.  LINES/TUBES: PIV  DISCUSSION: 66 year old female with diagnosis of DVT in last 2 weeks (started on xarelto last Friday), reports not taking but for one week. 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 bx pending P:   NPO for now Protonix for SUP Will consult onc  HEMATOLOGIC  Recent Labs  08/29/16 1825 08/30/16 0001  HGB 10.4* 10.0*    A:   Acute blood loss anemia from gastric/esophageal mass Pulmonary emboli Residual DVT L iliac on CTA ?Mets (CTA reveals extensive carcinomatosis) P:  Transfused total 4 PRBC , hgb 10. Today.   INFECTIOUS A:   Leukocytosis likely from stress P:   WBC and Fever Curves trending down 6/12  ENDOCRINE A:   Hyperglycemia    CBG (last 3)   Recent Labs  08/29/16 2044 08/30/16 0023 08/30/16 0411  GLUCAP 100* 108* 102*     P:   Trend Glucose   NEUROLOGIC A:   No issues  P:   Monitor  FAMILY  - Updates: Patient updated at bedside, no family present   - Inter-disciplinary family meet or Palliative Care meeting due by:  6/18  CC Time: 67 min   Richardson Landry Minor ACNP Maryanna Shape PCCM Pager [phone] till 3 pm If no answer page [phone] 08/30/2016, 8:46 AM  Attending Note:  66 year old female with GI bleeding and PE who received IVC filter already at this point.  On exam, lungs are clear.  I reviewed CXR myself, no acute disease noted.  Discussed with resident and TRH-MD.  Transfer to tele and to [facility] with PCCM off 6/14.  GI Bleeding:  - GI following  - Monitor coags  PE:  - IVC  - No anticoagulation  Abdominal mass:  - F/U on path  - Will call oncology upon biopsy results  Hypoxemia:  - Titrate O2 fr sat of 88-92%.  Transfer to tele and to [facility] service with PCCM off 6/14.  Patient seen and  examined, agree with above note.  I dictated the care and orders written for this patient under my direction.  Rush Farmer, [place]

## 2016-08-30 NOTE — Op Note (Signed)
[facility] Patient Name: Jodi Collins Procedure Date : 08/30/2016 MRN: [id number] Attending MD: Carol Ada , MD Date of Birth: 1950/07/27 CSN: [id number] Age: 66 Admit Type: Inpatient Procedure:                Upper GI endoscopy Indications:              Melena, Abnormal CT of the GI tract Providers:                Carolynn Comment, RN, Alan Mulder, Technician,                            William Dalton, Technician, Carol Ada, MD Referring MD:              Medicines:                Midazolam 4 mg IV, Fentanyl 25 micrograms IV Complications:            No immediate complications. Estimated Blood Loss:     Estimated blood loss was minimal. Procedure:                Pre-Anesthesia Assessment:                           - Prior to the procedure, a History and Physical                            was performed, and patient medications and                            allergies were reviewed. The patient's tolerance of                            previous anesthesia was also reviewed. The risks                            and benefits of the procedure and the sedation                            options and risks were discussed with the patient.                            All questions were answered, and informed consent                            was obtained. Prior Anticoagulants: The patient has                            taken no previous anticoagulant or antiplatelet                            agents. ASA Grade Assessment: II - A patient with                            mild systemic disease. After reviewing the risks  and benefits, the patient was deemed in                            satisfactory condition to undergo the procedure.                           - Sedation was administered by an endoscopy nurse.                            The sedation level attained was moderate.                           After obtaining informed consent,  the endoscope was                            passed under direct vision. Throughout the                            procedure, the patient's blood pressure, pulse, and                            oxygen saturations were monitored continuously. The                            EG-[id number] ([id number]) scope was introduced through the                            mouth, and advanced to the second part of duodenum.                            The upper GI endoscopy was technically difficult                            and complex due to abnormal anatomy. The patient                            tolerated the procedure well. Scope In: Scope Out: Findings:      A 4 cm hiatal hernia was present.      A large, fungating mass with no bleeding and no stigmata of recent       bleeding was found at the gastroesophageal junction. The mass was       non-obstructing and not circumferential.      A large, fungating, non-circumferential mass with no bleeding and no       stigmata of recent bleeding was found at the gastroesophageal junction,       in the cardia, in the gastric fundus and in the gastric body. Biopsies       were taken with a cold forceps for histology.      The examined duodenum was normal.      The anatomy was distorted. There was difficulty with passage of the       endoscope through the proximal stomach secondary to the hiatal hernia       and the gastric mass. The proximal extent of the mass was noted to be  involving the GE junction and the distal extent involved the proximal       gastric body. It was diffiult for me to discern if the mass was in the       greater or lesser curvature portion of the stomach. In the distal       portion of the mass, an ulceration was associated with the lesion. Impression:               - 4 cm hiatal hernia.                           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 Biopsied.                           - Normal examined duodenum. Moderate Sedation:      Moderate (conscious) sedation was administered by the endoscopy nurse       and supervised by the endoscopist. The following parameters were       monitored: oxygen saturation, heart rate, blood pressure, and response       to care. Recommendation:           - Return patient to hospital ward for ongoing care.                           - Resume regular diet.                           - Continue present medications.                           - Await pathology results. Procedure Code(s):        --- Professional ---                           [phone], Esophagogastroduodenoscopy, flexible,                            transoral; with biopsy, single or multiple Diagnosis Code(s):        --- Professional ---                           C16.0, Malignant neoplasm of cardia                           K44.9, Diaphragmatic hernia without obstruction or  gangrene                           C16.1, Malignant neoplasm of fundus of stomach                           C16.2, Malignant neoplasm of body of stomach                           K92.1, Melena (includes Hematochezia)                           R93.3, Abnormal findings on diagnostic imaging of                            other parts of digestive tract CPT copyright 2016 [employer]. All rights reserved. The codes documented in this report are preliminary and upon coder review may  be revised to meet current compliance requirements. Carol Ada, MD Carol Ada, MD 08/30/2016 7:54:56 AM This report has been signed electronically. Number of Addenda: 0

## 2016-08-30 NOTE — H&P (View-Only) (Signed)
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  She was transferred from [facility] after she presented with persistent melena.  Recently, after a plane flight she suffered with a DVT in the left lower extremity.  Xarelto as initiated, which then resulted in melenic stools and SOB.  She stopped the medication, but her symptoms continued to progress.  In the ER she was noted to have an HGB in the 4 range and her baseline is in the 12 range.  She was evaluated with a colonoscopy by Dr. Collene Mares in 2014 with a finding of an adenomatous polyp.  No prior EGD was required in the past.  A CT scan was obtained and it showed a proximal gastric mass with carcinomatosis.  She also has multiple filling defects in the right lung.  She is now s/p IVC placement and transfusion with a current HGB of 10.1 g/dL.  Her father had gastric cancer in his 46's and he died from the disease.  She does not recall any prior issues with melena prior to starting on Xarelto.  Also, she denies any problems with abdominal pain, nausea, vomiting, or early satiety.  Past Medical History:  Diagnosis Date  . DVT (deep venous thrombosis) ([place])     Past Surgical History:  Procedure Laterality Date  . CERVICAL CONIZATION W/BX  2012   high grade squamous intraepithelial dysplasia.  Dr Garwin Brothers.   . COLONOSCOPY W/ POLYPECTOMY  10/2006 and 10/2011   Dr Collene Mares.  had adenomatous polyps.    . IR IVC FILTER PLMT / S&I /IMG GUID/MOD SED  08/29/2016    History reviewed. No pertinent family history.  Social History:  reports that she has never smoked. She has never used smokeless tobacco. She reports that she drinks alcohol. She reports that she does not use drugs.  Allergies: No Known Allergies  Medications:  Scheduled: . fentaNYL      . lidocaine      .  midazolam      . pantoprazole (PROTONIX) IV  40 mg Intravenous Q24H   Continuous: . sodium chloride Stopped (08/28/16 2100)    Results for orders placed or performed during the hospital encounter of 08/28/16 (from the past 24 hour(s))  Comprehensive metabolic panel     Status: Abnormal   Collection Time: 08/28/16  3:47 PM  Result Value Ref Range   Sodium 138 135 - 145 mmol/L   Potassium 4.4 3.5 - 5.1 mmol/L   Chloride 106 101 - 111 mmol/L   CO2 16 (L) 22 - 32 mmol/L   Glucose, Bld 148 (H) 65 - 99 mg/dL   BUN 21 (H) 6 - 20 mg/dL   Creatinine, Ser 1.14 (H) 0.44 - 1.00 mg/dL   Calcium 8.3 (L) 8.9 - 10.3 mg/dL   Total Protein 7.1 6.5 - 8.1 g/dL   Albumin 2.6 (L) 3.5 - 5.0 g/dL   AST 54 (H) 15 - 41 U/L   ALT 32 14 - 54 U/L   Alkaline Phosphatase 77 38 - 126 U/L   Total Bilirubin 0.6 0.3 - 1.2 mg/dL   GFR calc non Af Amer 50 (L) >60 mL/min   GFR calc Af Amer 58 (L) >60 mL/min   Anion gap 16 (H) 5 - 15  CBC WITH DIFFERENTIAL     Status: Abnormal  Collection Time: 08/28/16  3:47 PM  Result Value Ref Range   WBC 13.2 (H) 4.0 - 10.5 K/uL   RBC 1.84 (L) 3.87 - 5.11 MIL/uL   Hemoglobin 4.4 (LL) 12.0 - 15.0 g/dL   HCT 15.5 (L) 36.0 - 46.0 %   MCV 84.2 78.0 - 100.0 fL   MCH 23.9 (L) 26.0 - 34.0 pg   MCHC 28.4 (L) 30.0 - 36.0 g/dL   RDW 21.8 (H) 11.5 - 15.5 %   Platelets 180 150 - 400 K/uL   Neutrophils Relative % 84 %   Neutro Abs 11.1 (H) 1.7 - 7.7 K/uL   Lymphocytes Relative 12 %   Lymphs Abs 1.6 0.7 - 4.0 K/uL   Monocytes Relative 3 %   Monocytes Absolute 0.4 0.1 - 1.0 K/uL   Eosinophils Relative 0 %   Eosinophils Absolute 0.0 0.0 - 0.7 K/uL   Basophils Relative 0 %   Basophils Absolute 0.0 0.0 - 0.1 K/uL  Brain natriuretic peptide     Status: Abnormal   Collection Time: 08/28/16  3:47 PM  Result Value Ref Range   B Natriuretic Peptide 347.0 (H) 0.0 - 100.0 pg/mL  Troponin I     Status: Abnormal   Collection Time: 08/28/16  3:47 PM  Result Value Ref Range   Troponin I  0.22 (HH) <0.03 ng/mL  Protime-INR     Status: Abnormal   Collection Time: 08/28/16  3:47 PM  Result Value Ref Range   Prothrombin Time 18.3 (H) 11.4 - 15.2 seconds   INR 1.50   Vitamin B12     Status: Abnormal   Collection Time: 08/28/16  4:42 PM  Result Value Ref Range   Vitamin B-12 5,758 (H) 180 - 914 pg/mL  Iron and TIBC     Status: Abnormal   Collection Time: 08/28/16  4:42 PM  Result Value Ref Range   Iron 16 (L) 28 - 170 ug/dL   TIBC 262 250 - 450 ug/dL   Saturation Ratios 6 (L) 10.4 - 31.8 %   UIBC 246 ug/dL  Ferritin     Status: None   Collection Time: 08/28/16  4:42 PM  Result Value Ref Range   Ferritin 26 11 - 307 ng/mL  Reticulocytes     Status: Abnormal   Collection Time: 08/28/16  4:42 PM  Result Value Ref Range   Retic Ct Pct 12.9 (H) 0.4 - 3.1 %   RBC. 1.79 (L) 3.87 - 5.11 MIL/uL   Retic Count, Manual 230.9 (H) 19.0 - 186.0 K/uL  APTT     Status: None   Collection Time: 08/28/16  4:42 PM  Result Value Ref Range   aPTT 28 24 - 36 seconds  Heparin level (unfractionated)     Status: Abnormal   Collection Time: 08/28/16  4:42 PM  Result Value Ref Range   Heparin Unfractionated <0.10 (L) 0.30 - 0.70 IU/mL  Type and screen     Status: None (Preliminary result)   Collection Time: 08/28/16  5:36 PM  Result Value Ref Range   ABO/RH(D) A POS    Antibody Screen NEG    Sample Expiration 08/31/2016    Unit Number [id number]    Blood Component Type RED CELLS,LR    Unit division 00    Status of Unit ALLOCATED    Transfusion Status OK TO TRANSFUSE    Crossmatch Result Compatible    Unit Number [id number]    Blood Component Type RED CELLS,LR    Unit division  00    Status of Unit ISSUED,FINAL    Transfusion Status OK TO TRANSFUSE    Crossmatch Result Compatible   Prepare RBC     Status: None   Collection Time: 08/28/16  5:36 PM  Result Value Ref Range   Order Confirmation ORDER PROCESSED BY BLOOD BANK   Folate     Status: None   Collection Time: 08/28/16   5:40 PM  Result Value Ref Range   Folate 18.4 >5.9 ng/mL  ABO/Rh     Status: None   Collection Time: 08/28/16  5:43 PM  Result Value Ref Range   ABO/RH(D) A POS   MRSA PCR Screening     Status: None   Collection Time: 08/28/16  8:50 PM  Result Value Ref Range   MRSA by PCR NEGATIVE NEGATIVE  CBC     Status: Abnormal   Collection Time: 08/28/16 11:01 PM  Result Value Ref Range   WBC 12.5 (H) 4.0 - 10.5 K/uL   RBC 2.18 (L) 3.87 - 5.11 MIL/uL   Hemoglobin 5.2 (LL) 12.0 - 15.0 g/dL   HCT 17.6 (L) 36.0 - 46.0 %   MCV 80.7 78.0 - 100.0 fL   MCH 23.9 (L) 26.0 - 34.0 pg   MCHC 29.5 (L) 30.0 - 36.0 g/dL   RDW 18.3 (H) 11.5 - 15.5 %   Platelets 117 (L) 150 - 400 K/uL  Glucose, capillary     Status: Abnormal   Collection Time: 08/28/16 11:54 PM  Result Value Ref Range   Glucose-Capillary 116 (H) 65 - 99 mg/dL   Comment 1 Capillary Specimen   Urinalysis, Routine w reflex microscopic     Status: Abnormal   Collection Time: 08/29/16 12:00 AM  Result Value Ref Range   Color, Urine YELLOW YELLOW   APPearance CLEAR CLEAR   Specific Gravity, Urine >1.046 (H) 1.005 - 1.030   pH 6.0 5.0 - 8.0   Glucose, UA NEGATIVE NEGATIVE mg/dL   Hgb urine dipstick NEGATIVE NEGATIVE   Bilirubin Urine NEGATIVE NEGATIVE   Ketones, ur NEGATIVE NEGATIVE mg/dL   Protein, ur NEGATIVE NEGATIVE mg/dL   Nitrite NEGATIVE NEGATIVE   Leukocytes, UA MODERATE (A) NEGATIVE   RBC / HPF 0-5 0 - 5 RBC/hpf   WBC, UA 0-5 0 - 5 WBC/hpf   Bacteria, UA RARE (A) NONE SEEN   Squamous Epithelial / LPF 0-5 (A) NONE SEEN   Mucous PRESENT   Magnesium     Status: None   Collection Time: 08/29/16  1:54 AM  Result Value Ref Range   Magnesium 2.2 1.7 - 2.4 mg/dL  Phosphorus     Status: None   Collection Time: 08/29/16  1:54 AM  Result Value Ref Range   Phosphorus 3.7 2.5 - 4.6 mg/dL  Lactic acid, plasma     Status: None   Collection Time: 08/29/16  1:54 AM  Result Value Ref Range   Lactic Acid, Venous 0.9 0.5 - 1.9 mmol/L   Type and screen [place]     Status: None (Preliminary result)   Collection Time: 08/29/16  1:58 AM  Result Value Ref Range   ABO/RH(D) A POS    Antibody Screen NEG    Sample Expiration 09/01/2016    Unit Number [id number]    Blood Component Type RED CELLS,LR    Unit division 00    Status of Unit ISSUED    Transfusion Status OK TO TRANSFUSE    Crossmatch Result Compatible    Unit  Number [id number]    Blood Component Type RED CELLS,LR    Unit division 00    Status of Unit ISSUED    Transfusion Status OK TO TRANSFUSE    Crossmatch Result Compatible    Unit Number [id number]    Blood Component Type RED CELLS,LR    Unit division 00    Status of Unit ISSUED    Transfusion Status OK TO TRANSFUSE    Crossmatch Result Compatible   ABO/Rh     Status: None   Collection Time: 08/29/16  1:58 AM  Result Value Ref Range   ABO/RH(D) A POS   Prepare RBC     Status: None   Collection Time: 08/29/16  2:39 AM  Result Value Ref Range   Order Confirmation ORDER PROCESSED BY BLOOD BANK   Glucose, capillary     Status: Abnormal   Collection Time: 08/29/16  3:59 AM  Result Value Ref Range   Glucose-Capillary 133 (H) 65 - 99 mg/dL   Comment 1 Capillary Specimen   Glucose, capillary     Status: Abnormal   Collection Time: 08/29/16  8:43 AM  Result Value Ref Range   Glucose-Capillary 114 (H) 65 - 99 mg/dL   Comment 1 Notify RN   CBC     Status: Abnormal   Collection Time: 08/29/16 11:43 AM  Result Value Ref Range   WBC 14.0 (H) 4.0 - 10.5 K/uL   RBC 3.86 (L) 3.87 - 5.11 MIL/uL   Hemoglobin 10.1 (L) 12.0 - 15.0 g/dL   HCT 31.8 (L) 36.0 - 46.0 %   MCV 82.4 78.0 - 100.0 fL   MCH 26.2 26.0 - 34.0 pg   MCHC 31.8 30.0 - 36.0 g/dL   RDW 18.4 (H) 11.5 - 15.5 %   Platelets 89 (L) 150 - 400 K/uL  Comprehensive metabolic panel     Status: Abnormal   Collection Time: 08/29/16 11:43 AM  Result Value Ref Range   Sodium 139 135 - 145 mmol/L   Potassium 4.3 3.5 - 5.1  mmol/L   Chloride 112 (H) 101 - 111 mmol/L   CO2 20 (L) 22 - 32 mmol/L   Glucose, Bld 113 (H) 65 - 99 mg/dL   BUN 19 6 - 20 mg/dL   Creatinine, Ser 0.99 0.44 - 1.00 mg/dL   Calcium 7.9 (L) 8.9 - 10.3 mg/dL   Total Protein 6.3 (L) 6.5 - 8.1 g/dL   Albumin 2.3 (L) 3.5 - 5.0 g/dL   AST 41 15 - 41 U/L   ALT 26 14 - 54 U/L   Alkaline Phosphatase 72 38 - 126 U/L   Total Bilirubin 1.5 (H) 0.3 - 1.2 mg/dL   GFR calc non Af Amer 59 (L) >60 mL/min   GFR calc Af Amer >60 >60 mL/min   Anion gap 7 5 - 15  Glucose, capillary     Status: None   Collection Time: 08/29/16 12:57 PM  Result Value Ref Range   Glucose-Capillary 99 65 - 99 mg/dL   Comment 1 Notify RN      Ct Angio Chest W/cm &/or Wo Cm  Result Date: 08/28/2016 CLINICAL DATA:  66 year old female with a history of shortness of breath EXAM: CT ANGIOGRAPHY CHEST CT ABDOMEN AND PELVIS WITH CONTRAST TECHNIQUE: Multidetector CT imaging of the chest was performed using the standard protocol during bolus administration of intravenous contrast. Multiplanar CT image reconstructions and MIPs were obtained to evaluate the vascular anatomy. Multidetector CT imaging of the abdomen and pelvis  was performed using the standard protocol during bolus administration of intravenous contrast. CONTRAST:  100 cc Isovue 370 COMPARISON:  None. FINDINGS: CTA CHEST FINDINGS Cardiovascular: Heart: No cardiomegaly. No pericardial fluid/thickening. No significant coronary calcifications. Right and left ventricles are somewhat compressed by the large left-sided diaphragmatic herniation. Estimated diameter of the left ventricle measures 20 mm with estimated diameter of the right ventricle measuring 43 mm. Aorta: Unremarkable course, caliber, contour of the thoracic aorta. No aneurysm or dissection flap. No periaortic fluid. Pulmonary arteries: Filling defects of the right-sided pulmonary arteries involving the segmental branches of the right lower lobe, right middle lobe, right  upper lobe. No filling defects within left-sided pulmonary arteries identified. Mediastinum/Nodes: Mediastinal structures somewhat compressed by the diaphragmatic hernia. Lymph nodes are present within the mediastinum, with the lowest peritracheal lymph nodes borderline enlarged. Lungs/Pleura: Right lung with no pleural effusion or confluent airspace disease. Atelectatic changes at the base of the left lung.  No pneumothorax. Small nodule within the right upper lobe (image 30 of series 6). Musculoskeletal: No displaced fracture. Degenerative changes of the spine. Review of the MIP images confirms the above findings. CT ABDOMEN and PELVIS FINDINGS Hepatobiliary: Rounded hypodense lesion of the inferior right liver measures 7 mm (image 22 of series 12) and 10 mm (image 26 of series 12). Unremarkable appearance the gallbladder. Pancreas: Unremarkable appearance of the pancreas. Spleen: Unremarkable appearance of spleen Adrenals/Urinary Tract: Unremarkable appearance the bilateral adrenal glands. No evidence of left-sided or right-sided hydronephrosis. No nephrolithiasis. No perinephric stranding. On the right there is an ill-defined hypodense/ hypoenhancing region at the posterior/ lateral cortex of the kidney measuring 10 mm. Stomach/Bowel: Herniation of the majority of the stomach into the left chest through diaphragmatic hernia. The splenic flexure is herniated into the left chest. No evidence of abnormally distended small bowel or colon. Vascular/Lymphatic: Extensive nodularity of the mesenteric fat associated with the stomach, splenic flexure, and the mesenteric fat herniated into the left chest. Nodular tissue associated with the vasculature. There is circumferential soft tissue surrounding the left gastric artery after the origin from the celiac artery soft tissue essentially in cases the gastric artery just after the origin. Hazy infiltration of the mesenteric fat within the left upper quadrant. Enhancing  peritoneal surfaces in the low abdomen/pelvis, involving nearly all the visualized at adnexa and the mesenteric fat associated with small bowel. Small volume ascites in the abdomen/pelvis. Unremarkable course caliber and contour of the abdominal aorta. No significant atherosclerotic changes. Proximal femoral vasculature patent. Nonocclusive venous thrombus within the left iliac system at the confluence of internal iliac vein in the external iliac vein. Proximal femoral veins unremarkable. Reproductive: Enhancing soft tissue nodularity associated with the right and left adnexa, with the surfaces of the broad ligaments in case with enhancing soft tissue. Other: Enhancing soft tissue within the umbilical region, likely pathologic lymph node. Musculoskeletal: No displaced fracture. No significant degenerative changes of the hips. Review of the MIP images confirms the above findings. IMPRESSION: The study is positive for right-sided pulmonary emboli involving segmental and subsegmental branches. Assessing for heart strain is limited given the distortion secondary to left-sided diaphragmatic hernia. If there is concern for right-sided heart strain, correlation with cardiac ECHO may be useful. Left-sided diaphragmatic hernia, involving majority of the stomach, splenic flexure, and large portion of the associated mesentery. 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 Alternative site of a  presumed malignancy of the left upper quadrant could be the splenic flexure. Correlation with endoscopy and possibly colonoscopy recommended. 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 Enhancing lesion within the  umbilical region, likely metastatic involvement of lymph node. Small hypodense lesions of the right liver, too small to characterize and possibly benign cysts. Correlation with any future PET-CT may be useful. Low-density lesion of the lateral cortex of the right kidney. Differential diagnosis includes kidney infarction, infection, or tumor implant. Small nodule of the right upper lobe measuring 4 mm. Correlation with future imaging recommended. Nonocclusive left iliac DVT. These results were called by telephone at the time of interpretation on 08/28/2016 at 5:52 pm to Dr. Merrily Pew , who verbally acknowledged these results. Signed, Dulcy Fanny. Earleen Newport, DO [facility] Radiology Electronically Signed   By: Corrie Mckusick D.O.   On: 08/28/2016 17:57   Ct Abdomen Pelvis W Contrast  Result Date: 08/28/2016 CLINICAL DATA:  66 year old female with a history of shortness of breath EXAM: CT ANGIOGRAPHY CHEST CT ABDOMEN AND PELVIS WITH CONTRAST TECHNIQUE: Multidetector CT imaging of the chest was performed using the standard protocol during bolus administration of intravenous contrast. Multiplanar CT image reconstructions and MIPs were obtained to evaluate the vascular anatomy. Multidetector CT imaging of the abdomen and pelvis was performed using the standard protocol during bolus administration of intravenous contrast. CONTRAST:  100 cc Isovue 370 COMPARISON:  None. FINDINGS: CTA CHEST FINDINGS Cardiovascular: Heart: No cardiomegaly. No pericardial fluid/thickening. No significant coronary calcifications. Right and left ventricles are somewhat compressed by the large left-sided diaphragmatic herniation. Estimated diameter of the left ventricle measures 20 mm with estimated diameter of the right ventricle measuring 43 mm. Aorta: Unremarkable course, caliber, contour of the thoracic aorta. No aneurysm or dissection flap. No periaortic fluid. Pulmonary arteries: Filling  defects of the right-sided pulmonary arteries involving the segmental branches of the right lower lobe, right middle lobe, right upper lobe. No filling defects within left-sided pulmonary arteries identified. Mediastinum/Nodes: Mediastinal structures somewhat compressed by the diaphragmatic hernia. Lymph nodes are present within the mediastinum, with the lowest peritracheal lymph nodes borderline enlarged. Lungs/Pleura: Right lung with no pleural effusion or confluent airspace disease. Atelectatic changes at the base of the left lung.  No pneumothorax. Small nodule within the right upper lobe (image 30 of series 6). Musculoskeletal: No displaced fracture. Degenerative changes of the spine. Review of the MIP images confirms the above findings. CT ABDOMEN and PELVIS FINDINGS Hepatobiliary: Rounded hypodense lesion of the inferior right liver measures 7 mm (image 22 of series 12) and 10 mm (image 26 of series 12). Unremarkable appearance the gallbladder. Pancreas: Unremarkable appearance of the pancreas. Spleen: Unremarkable appearance of spleen Adrenals/Urinary Tract: Unremarkable appearance the bilateral adrenal glands. No evidence of left-sided or right-sided hydronephrosis. No nephrolithiasis. No perinephric stranding. On the right there is an ill-defined hypodense/ hypoenhancing region at the posterior/ lateral cortex of the kidney measuring 10 mm. Stomach/Bowel: Herniation of the majority of the stomach into the left chest through diaphragmatic hernia. The splenic flexure is herniated into the left chest. No evidence of abnormally distended small bowel or colon. Vascular/Lymphatic: Extensive nodularity of the mesenteric fat associated with the stomach, splenic flexure, and the mesenteric fat herniated into the left chest. Nodular tissue associated with the vasculature. There is circumferential soft tissue surrounding the left  gastric artery after the origin from the celiac artery soft tissue essentially in cases  the gastric artery just after the origin. Hazy infiltration of the mesenteric fat within the left upper quadrant. Enhancing peritoneal surfaces in the low abdomen/pelvis, involving nearly all the visualized at adnexa and the mesenteric fat associated with small bowel. Small volume ascites in the abdomen/pelvis. Unremarkable course caliber and contour of the abdominal aorta. No significant atherosclerotic changes. Proximal femoral vasculature patent. Nonocclusive venous thrombus within the left iliac system at the confluence of internal iliac vein in the external iliac vein. Proximal femoral veins unremarkable. Reproductive: Enhancing soft tissue nodularity associated with the right and left adnexa, with the surfaces of the broad ligaments in case with enhancing soft tissue. Other: Enhancing soft tissue within the umbilical region, likely pathologic lymph node. Musculoskeletal: No displaced fracture. No significant degenerative changes of the hips. Review of the MIP images confirms the above findings. IMPRESSION: The study is positive for right-sided pulmonary emboli involving segmental and subsegmental branches. Assessing for heart strain is limited given the distortion secondary to left-sided diaphragmatic hernia. If there is concern for right-sided heart strain, correlation with cardiac ECHO may be useful. Left-sided diaphragmatic hernia, involving majority of the stomach, splenic flexure, and large portion of the associated mesentery. 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 Alternative site of a presumed malignancy of the left upper quadrant could be the splenic flexure. Correlation with endoscopy and possibly colonoscopy recommended. 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 Enhancing lesion within the umbilical region, likely metastatic involvement of lymph node. Small hypodense lesions of the right liver, too small to characterize and possibly benign cysts. Correlation with any future PET-CT may be useful. Low-density lesion of the lateral cortex of the right kidney. Differential diagnosis includes kidney infarction, infection, or tumor implant. Small nodule of the right upper lobe measuring 4 mm. Correlation with future imaging recommended. Nonocclusive left iliac DVT. These results were called by telephone at the time of interpretation on 08/28/2016 at 5:52 pm to Dr. Merrily Pew , who verbally acknowledged these results. Signed, Dulcy Fanny. Earleen Newport, DO [facility] Radiology Electronically Signed   By: Corrie Mckusick D.O.   On: 08/28/2016 17:57   Ir Ivc Filter Plmt / S&i /img Guid/mod Sed  Result Date: 08/29/2016 INDICATION: 66 year old female with pulmonary embolism and left iliac DVT. Risk factor of new malignancy. Anti coagulation contraindicated secondary to presumed gastrointestinal hemorrhage EXAM: ULTRASOUND GUIDED ACCESS RIGHT INTERNAL JUGULAR VEIN. PLACEMENT OF RETRIEVABLE IVC FILTER VIA IJ APPROACH MEDICATIONS: None. ANESTHESIA/SEDATION: 1.5 mg IV Versed; 75 mcg IV Fentanyl Moderate Sedation Time:  10 minutes The patient was continuously monitored during the procedure by the interventional radiology nurse under my direct supervision. FLUOROSCOPY TIME:  Fluoroscopy Time: 0 minutes 36 seconds (37 mGy). COMPLICATIONS: None PROCEDURE: The procedure, risks, benefits, and alternatives were explained to the patient. Specific risks discussed include bleeding, infection, contrast reaction, renal failure, IVC filter fracture, migration, ileo caval thrombus (3% incidence), need for further procedure, need for further surgery, pulmonary  embolism, cardiopulmonary collapse, death. Questions regarding the procedure were encouraged and answered. The patient understands and consents to the procedure. Ultrasound survey was performed with images stored and sent to PACs.  The neck was prepped with Betadine in a sterile fashion, and a sterile drape was applied covering the operative field. A sterile gown and sterile gloves were used for the procedure. Local anesthesia was provided with 1% Lidocaine. A micropuncture needle was used access the right internal jugular vein under ultrasound. With excellent venous blood flow returned, and an .018 micro wire was passed through the needle. Small incision was made with an 11 blade scalpel. The needle was removed, and a micropuncture sheath was placed over the wire. The inner dilator and wire were removed, and an 035 Bentson wire was advanced under fluoroscopy into the IVC. Serial dilation of the soft tissue tract was performed with an 8 [place] dilator and subsequently a 10 [place] dilator. The delivery sheath for a retrievable Bard Denali filter was passed over the Bentson wire into the IVC. The wire was removed and small contrast was used to confirm IVC location. IVC cavagram performed. Dilator was removed, and the IVC filter was then delivered, positioned below the lowest renal vein. Repeat cavagram performed, and the catheter was removed. Manual pressure was used for hemostasis. Patient tolerated the procedure well and remained hemodynamically stable throughout. No complications were encountered and no significant blood loss was encounter. IMPRESSION: Status post retrievable IVC filter placement via IJ approach. Signed, Dulcy Fanny. Earleen Newport DO [facility] Rad[facility] PLAN: This IVC filter is potentially retrievable. The patient may be assessed for filter retrieval by Interventional Radiology in approximately 8-12 weeks. Further recommendations regarding filter retrieval,  continued surveillance or declaration of device permanence, can be made at that time. Electronically Signed   By: Corrie Mckusick D.O.   On: 08/29/2016 12:18    ROS:  As stated above in the HPI otherwise negative.  Blood pressure 134/81, pulse 88, temperature 99.5 F (37.5 C), temperature source Oral, resp. rate (!) 23, height 5\' 2"  (1.575 m), weight 62.6 kg (138 lb), SpO2 96 %.    PE: Gen: NAD, Alert and Oriented HEENT:  [place]/AT, EOMI Neck: Supple, no LAD Lungs: CTA Bilaterally CV: RRR without M/G/R ABM: Soft, NTND, +BS Ext: No C/C/E  Assessment/Plan: 1) Gastric mass. 2) Melena. 3) PE.   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  Further evaluation is required with an EGD with biopsies.    Plan: 1) EGD with biopsies tomorrow.  Avo Schlachter D 08/29/2016, 1:40 PM

## 2016-08-31 ENCOUNTER — Encounter (HOSPITAL_COMMUNITY): Payer: Self-pay

## 2016-08-31 DIAGNOSIS — C7982 Secondary malignant neoplasm of genital organs: Secondary | ICD-10-CM

## 2016-08-31 DIAGNOSIS — I2692 Saddle embolus of pulmonary artery without acute cor pulmonale: Secondary | ICD-10-CM

## 2016-08-31 DIAGNOSIS — N179 Acute kidney failure, unspecified: Secondary | ICD-10-CM

## 2016-08-31 DIAGNOSIS — K449 Diaphragmatic hernia without obstruction or gangrene: Secondary | ICD-10-CM

## 2016-08-31 DIAGNOSIS — C762 Malignant neoplasm of abdomen: Secondary | ICD-10-CM

## 2016-08-31 DIAGNOSIS — C786 Secondary malignant neoplasm of retroperitoneum and peritoneum: Secondary | ICD-10-CM

## 2016-08-31 DIAGNOSIS — C169 Malignant neoplasm of stomach, unspecified: Secondary | ICD-10-CM

## 2016-08-31 DIAGNOSIS — I82402 Acute embolism and thrombosis of unspecified deep veins of left lower extremity: Secondary | ICD-10-CM

## 2016-08-31 DIAGNOSIS — K229 Disease of esophagus, unspecified: Secondary | ICD-10-CM

## 2016-08-31 LAB — GLUCOSE, CAPILLARY
GLUCOSE-CAPILLARY: 103 mg/dL — AB (ref 65–99)
GLUCOSE-CAPILLARY: 109 mg/dL — AB (ref 65–99)
Glucose-Capillary: 106 mg/dL — ABNORMAL HIGH (ref 65–99)
Glucose-Capillary: 95 mg/dL (ref 65–99)

## 2016-08-31 NOTE — Progress Notes (Signed)
Subjective: No complaints.  Feeling well.  Objective: Vital signs in last 24 hours: Temp:  [97.6 F (36.4 C)-98.4 F (36.9 C)] 98 F (36.7 C) (06/14 1700) Pulse Rate:  [79-106] 89 (06/14 1700) Resp:  [18-29] 24 (06/14 1700) BP: (99-122)/(67-81) 116/68 (06/14 1700) SpO2:  [95 %-100 %] 98 % (06/14 1700) Weight:  [57.8 kg (127 lb 8 oz)] 57.8 kg (127 lb 8 oz) (06/14 0500) Last BM Date: 08/30/16  Intake/Output from previous day: No intake/output data recorded. Intake/Output this shift: No intake/output data recorded.  General appearance: alert and no distress GI: soft, non-tender; bowel sounds normal; no masses,  no organomegaly  Lab Results:  Recent Labs  08/29/16 1143 08/29/16 1825 08/30/16 0001  WBC 14.0* 14.0* 13.1*  HGB 10.1* 10.4* 10.0*  HCT 31.8* 32.7* 31.1*  PLT 89* 88* 82*   BMET  Recent Labs  08/29/16 1143 08/30/16 0001  NA 139 137  K 4.3 4.1  CL 112* 111  CO2 20* 19*  GLUCOSE 113* 105*  BUN 19 18  CREATININE 0.99 0.99  CALCIUM 7.9* 8.1*   LFT  Recent Labs  08/29/16 1143  PROT 6.3*  ALBUMIN 2.3*  AST 41  ALT 26  ALKPHOS 72  BILITOT 1.5*   PT/INR  Recent Labs  08/30/16 0001  LABPROT 17.0*  INR 1.37   Hepatitis Panel No results for input(s): HEPBSAG, HCVAB, HEPAIGM, HEPBIGM in the last 72 hours. C-Diff No results for input(s): CDIFFTOX in the last 72 hours. Fecal Lactopherrin No results for input(s): FECLLACTOFRN in the last 72 hours.  Studies/Results: No results found.  Medications:  Scheduled: . pantoprazole (PROTONIX) IV  40 mg Intravenous Q24H   Continuous: . sodium chloride Stopped (08/28/16 2100)    Assessment/Plan: 1) Gastric adenocarcinoma.  Poorly differentiated. 2) PE. 3) Anemia.   I discussed the results of the biopsies with the patient.  Dr. Sherral Hammers has requested a consultation from Dr. Jana Hakim.  No further GI evaluation was this time.  Signing off.  LOS: 3 days   Jodi Collins D 08/31/2016, 8:24 PM

## 2016-08-31 NOTE — Consult Note (Signed)
[place]  [phone] [phone]     ID: Lera Gaines DOB: [birth year]  MR#: [id number]  MBW#:[id number]  Patient Care Team: Glendale Chard, MD as PCP - General (Internal Medicine) Carol Ada, MD as Consulting Physician (Gastroenterology) Ladell Pier, MD as Consulting Physician (Oncology) Leightyn Cina, Virgie Dad, MD as Consulting Physician (Oncology) Chauncey Cruel, MD OTHER MD:  CHIEF COMPLAINT: Stage for signet ring gastric adenocarcinoma  CURRENT TREATMENT: Awaiting initial chemotherapy   HISTORY OF PRESENT ILLNESS: Mckaylie noted some left lower extremity swelling after a plane trip and brought it to Dr. Baird Cancer attention. On 08/17/2016 she had Doppler ultrasonography of both lower extremities. The right leg was clear. The left popliteal and posterior tibial veins showed nonocclusive thrombus.  The patient was started on Xarelto, but developed melenic stools and stopped the medication after a few days. She then presented to the emergency room with shortness of breath, cough, and further left lower extremity swelling 08/28/2016. CT angiogram that day confirmed a right-sided segmental and subsegmental emboli. CT of the abdomen and pelvis on the same day also showed a very large left diaphragmatic hernia, extensive soft tissue infiltration of the stomach body, gastric artery, and umbilical region, with extensive carcinomatosis, involvement of the adnexa, and 2 small liver lesions  Her anticoagulation was stopped and she had an IVC filter placed. She was also found to be severely anemic, with a hemoglobin of 4.4, MCV 84.2. She has been transfused to the current hemoglobin level of 10.0.  On 08/30/2016 the patient underwent EGD which showed tumor at the gastroesophageal junction and in the body of the stomach, with biopsy [id number] showing an invasive poorly differentiated carcinoma with focal signet ring features.  The patient's  subsequent history is as detailed below  INTERVAL HISTORY: I met with the patient in her hospital room for approximately 40 minutes on 08/31/2016  REVIEW OF SYSTEMS: She denies pain. She tells me she is able to eat. She says her bowel movements have normalized. [facility] at least 3 times a week. She denies unusual headaches, visual changes, nausea, vomiting, cough, phlegm production, or pleurisy at present. She thinks the left lower extremity swelling has decreased. A detailed review of systems was otherwise noncontributory  PAST MEDICAL HISTORY: Past Medical History:  Diagnosis Date  . DVT (deep venous thrombosis) ([place])     PAST SURGICAL HISTORY: Past Surgical History:  Procedure Laterality Date  . CERVICAL CONIZATION W/BX  2012   high grade squamous intraepithelial dysplasia.  Dr Garwin Brothers.   . COLONOSCOPY W/ POLYPECTOMY  10/2006 and 10/2011   Dr Collene Mares.  had adenomatous polyps.    . ESOPHAGOGASTRODUODENOSCOPY N/A 08/30/2016   Procedure: ESOPHAGOGASTRODUODENOSCOPY (EGD);  Surgeon: Carol Ada, MD;  Location: [facility];  Service: Gastroenterology;  Laterality: N/A;  . IR IVC FILTER PLMT / S&I /IMG GUID/MOD SED  08/29/2016    FAMILY HISTORY History reviewed. No pertinent family history. The patient's father had gastric cancer diagnosed in his 44s. He lived 6 months. The patient's mother is living, age 76 as of June 2018. The patient has no brothers, 3 sisters. All family members live in [place].  GYNECOLOGIC HISTORY:  No LMP recorded. Patient is postmenopausal. Menarche age 10. She never carried a child to term. She stopped having periods in her 40s. She did not take hormone replacement.  SOCIAL HISTORY:  Gabby was born in [place] and [place] She works  as a Herbalist. She is separated, lives alone with no pets. She attends the [employer] in [place].   ADVANCED DIRECTIVES: Not in place. At  the 08/31/2016 visit the patient was urged to complete and notarize healthcare part of attorney documents   HEALTH MAINTENANCE: Social History  Substance Use Topics  . Smoking status: Never Smoker  . Smokeless tobacco: Never Used  . Alcohol use Yes     Comment: rarely      No Known Allergies  Current Facility-Administered Medications  Medication Dose Route Frequency Provider Last Rate Last Dose  . 0.9 %  sodium chloride infusion  250 mL Intravenous PRN Juanito Doom, MD   Stopped at 08/28/16 2100  . iopamidol (ISOVUE-300) 61 % injection 100 mL  100 mL Intravenous Once PRN Corrie Mckusick, DO      . pantoprazole (PROTONIX) injection 40 mg  40 mg Intravenous Q24H Hayden Pedro M, NP   40 mg at 08/31/16 1033    OBJECTIVE:Middle-aged African-American woman examined in a recliner Vitals:   08/31/16 1500 08/31/16 1700  BP: 108/77 116/68  Pulse: (!) 106 89  Resp: (!) 22 (!) 24  Temp: 97.6 F (36.4 C) 98 F (36.7 C)     Body mass index is 23.32 kg/m.    ECOG FS:2 - Symptomatic, <50% confined to bed  Ear-nose-throat: Oropharynx clear, slightly dry Lymphatic: No cervical or supraclavicular adenopathy Lungs no rales or rhonchi Heart regular rate and rhythm Abd soft, slightly distended, nontender, positive bowel sounds Neuro: non-focal, well-oriented, appropriate affect  LAB RESULTS:  CMP     Component Value Date/Time   NA 137 08/30/2016 0001   K 4.1 08/30/2016 0001   CL 111 08/30/2016 0001   CO2 19 (L) 08/30/2016 0001   GLUCOSE 105 (H) 08/30/2016 0001   BUN 18 08/30/2016 0001   CREATININE 0.99 08/30/2016 0001   CALCIUM 8.1 (L) 08/30/2016 0001   PROT 6.3 (L) 08/29/2016 1143   ALBUMIN 2.3 (L) 08/29/2016 1143   AST 41 08/29/2016 1143   ALT 26 08/29/2016 1143   ALKPHOS 72 08/29/2016 1143   BILITOT 1.5 (H) 08/29/2016 1143   GFRNONAA 59 (L) 08/30/2016 0001   GFRAA >60 08/30/2016 0001    No results found for: TOTALPROTELP, ALBUMINELP, A1GS, A2GS, BETS, BETA2SER,  GAMS, MSPIKE, SPEI  No results found for: KPAFRELGTCHN, LAMBDASER, KAPLAMBRATIO  Lab Results  Component Value Date   WBC 13.1 (H) 08/30/2016   NEUTROABS 11.1 (H) 08/28/2016   HGB 10.0 (L) 08/30/2016   HCT 31.1 (L) 08/30/2016   MCV 82.7 08/30/2016   PLT 82 (L) 08/30/2016    @LASTCHEMISTRY @  No results found for: LABCA2  No components found for: [id number]   Recent Labs Lab 08/30/16 0001  INR 1.37    Urinalysis    Component Value Date/Time   COLORURINE YELLOW 08/29/2016 0000   APPEARANCEUR CLEAR 08/29/2016 0000   LABSPEC >1.046 (H) 08/29/2016 0000   PHURINE 6.0 08/29/2016 0000   GLUCOSEU NEGATIVE 08/29/2016 0000   HGBUR NEGATIVE 08/29/2016 0000   BILIRUBINUR NEGATIVE 08/29/2016 0000   KETONESUR NEGATIVE 08/29/2016 0000   PROTEINUR NEGATIVE 08/29/2016 0000   NITRITE NEGATIVE 08/29/2016 0000   LEUKOCYTESUR MODERATE (A) 08/29/2016 0000     STUDIES: Ct Angio Chest W/cm &/or Wo Cm  Result Date: 08/28/2016 CLINICAL DATA:  66 year old female with a history of shortness of breath EXAM: CT ANGIOGRAPHY CHEST CT ABDOMEN AND PELVIS WITH CONTRAST TECHNIQUE: Multidetector CT imaging of the chest was performed  using the standard protocol during bolus administration of intravenous contrast. Multiplanar CT image reconstructions and MIPs were obtained to evaluate the vascular anatomy. Multidetector CT imaging of the abdomen and pelvis was performed using the standard protocol during bolus administration of intravenous contrast. CONTRAST:  100 cc Isovue 370 COMPARISON:  None. FINDINGS: CTA CHEST FINDINGS Cardiovascular: Heart: No cardiomegaly. No pericardial fluid/thickening. No significant coronary calcifications. Right and left ventricles are somewhat compressed by the large left-sided diaphragmatic herniation. Estimated diameter of the left ventricle measures 20 mm with estimated diameter of the right ventricle measuring 43 mm. Aorta: Unremarkable course, caliber, contour of the thoracic  aorta. No aneurysm or dissection flap. No periaortic fluid. Pulmonary arteries: Filling defects of the right-sided pulmonary arteries involving the segmental branches of the right lower lobe, right middle lobe, right upper lobe. No filling defects within left-sided pulmonary arteries identified. Mediastinum/Nodes: Mediastinal structures somewhat compressed by the diaphragmatic hernia. Lymph nodes are present within the mediastinum, with the lowest peritracheal lymph nodes borderline enlarged. Lungs/Pleura: Right lung with no pleural effusion or confluent airspace disease. Atelectatic changes at the base of the left lung.  No pneumothorax. Small nodule within the right upper lobe (image 30 of series 6). Musculoskeletal: No displaced fracture. Degenerative changes of the spine. Review of the MIP images confirms the above findings. CT ABDOMEN and PELVIS FINDINGS Hepatobiliary: Rounded hypodense lesion of the inferior right liver measures 7 mm (image 22 of series 12) and 10 mm (image 26 of series 12). Unremarkable appearance the gallbladder. Pancreas: Unremarkable appearance of the pancreas. Spleen: Unremarkable appearance of spleen Adrenals/Urinary Tract: Unremarkable appearance the bilateral adrenal glands. No evidence of left-sided or right-sided hydronephrosis. No nephrolithiasis. No perinephric stranding. On the right there is an ill-defined hypodense/ hypoenhancing region at the posterior/ lateral cortex of the kidney measuring 10 mm. Stomach/Bowel: Herniation of the majority of the stomach into the left chest through diaphragmatic hernia. The splenic flexure is herniated into the left chest. No evidence of abnormally distended small bowel or colon. Vascular/Lymphatic: Extensive nodularity of the mesenteric fat associated with the stomach, splenic flexure, and the mesenteric fat herniated into the left chest. Nodular tissue associated with the vasculature. There is circumferential soft tissue surrounding the left  gastric artery after the origin from the celiac artery soft tissue essentially in cases the gastric artery just after the origin. Hazy infiltration of the mesenteric fat within the left upper quadrant. Enhancing peritoneal surfaces in the low abdomen/pelvis, involving nearly all the visualized at adnexa and the mesenteric fat associated with small bowel. Small volume ascites in the abdomen/pelvis. Unremarkable course caliber and contour of the abdominal aorta. No significant atherosclerotic changes. Proximal femoral vasculature patent. Nonocclusive venous thrombus within the left iliac system at the confluence of internal iliac vein in the external iliac vein. Proximal femoral veins unremarkable. Reproductive: Enhancing soft tissue nodularity associated with the right and left adnexa, with the surfaces of the broad ligaments in case with enhancing soft tissue. Other: Enhancing soft tissue within the umbilical region, likely pathologic lymph node. Musculoskeletal: No displaced fracture. No significant degenerative changes of the hips. Review of the MIP images confirms the above findings. IMPRESSION: The study is positive for right-sided pulmonary emboli involving segmental and subsegmental branches. Assessing for heart strain is limited given the distortion secondary to left-sided diaphragmatic hernia. If there is concern for right-sided heart strain, correlation with cardiac ECHO may be useful. Left-sided diaphragmatic hernia, involving majority of the stomach, splenic flexure, and large portion of the associated mesentery.  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 Alternative site of a presumed malignancy of the left upper quadrant could be the splenic flexure. Correlation with endoscopy and possibly colonoscopy recommended. 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 Enhancing lesion within the umbilical region, likely metastatic involvement of lymph node. Small hypodense lesions of the right liver, too small to characterize and possibly benign cysts. Correlation with any future PET-CT may be useful. Low-density lesion of the lateral cortex of the right kidney. Differential diagnosis includes kidney infarction, infection, or tumor implant. Small nodule of the right upper lobe measuring 4 mm. Correlation with future imaging recommended. Nonocclusive left iliac DVT. These results were called by telephone at the time of interpretation on 08/28/2016 at 5:52 pm to Dr. Merrily Pew , who verbally acknowledged these results. Signed, Dulcy Fanny. Earleen Newport, DO [facility] Radiology Electronically Signed   By: Corrie Mckusick D.O.   On: 08/28/2016 17:57   Ct Abdomen Pelvis W Contrast  Result Date: 08/28/2016 CLINICAL DATA:  66 year old female with a history of shortness of breath EXAM: CT ANGIOGRAPHY CHEST CT ABDOMEN AND PELVIS WITH CONTRAST TECHNIQUE: Multidetector CT imaging of the chest was performed using the standard protocol during bolus administration of intravenous contrast. Multiplanar CT image reconstructions and MIPs were obtained to evaluate the vascular anatomy. Multidetector CT imaging of the abdomen and pelvis was performed using the standard protocol during bolus administration of intravenous contrast. CONTRAST:  100 cc Isovue 370 COMPARISON:  None. FINDINGS: CTA CHEST FINDINGS Cardiovascular: Heart: No cardiomegaly. No pericardial fluid/thickening. No significant coronary calcifications. Right and left ventricles are somewhat compressed by the large left-sided diaphragmatic herniation. Estimated diameter of the left ventricle measures 20 mm  with estimated diameter of the right ventricle measuring 43 mm. Aorta: Unremarkable course, caliber, contour of the thoracic aorta. No aneurysm or dissection flap. No periaortic fluid. Pulmonary arteries: Filling defects of the right-sided pulmonary arteries involving the segmental branches of the right lower lobe, right middle lobe, right upper lobe. No filling defects within left-sided pulmonary arteries identified. Mediastinum/Nodes: Mediastinal structures somewhat compressed by the diaphragmatic hernia. Lymph nodes are present within the mediastinum, with the lowest peritracheal lymph nodes borderline enlarged. Lungs/Pleura: Right lung with no pleural effusion or confluent airspace disease. Atelectatic changes at the base of the left lung.  No pneumothorax. Small nodule within the right upper lobe (image 30 of series 6). Musculoskeletal: No displaced fracture. Degenerative changes of the spine. Review of the MIP images confirms the above findings. CT ABDOMEN and PELVIS FINDINGS Hepatobiliary: Rounded hypodense lesion of the inferior right liver measures 7 mm (image 22 of series 12) and 10 mm (image 26 of series 12). Unremarkable appearance the gallbladder. Pancreas: Unremarkable appearance of the pancreas. Spleen: Unremarkable appearance of spleen Adrenals/Urinary Tract: Unremarkable appearance the bilateral adrenal glands. No evidence of left-sided or right-sided hydronephrosis. No nephrolithiasis. No perinephric stranding. On the right there is an ill-defined hypodense/ hypoenhancing region at the posterior/ lateral cortex of the kidney measuring 10 mm. Stomach/Bowel: Herniation of the majority of the stomach into the left chest through diaphragmatic hernia. The splenic flexure is herniated into the left chest. No evidence of abnormally distended small bowel or colon. Vascular/Lymphatic: Extensive nodularity of the  mesenteric fat associated with the stomach, splenic flexure, and the mesenteric fat herniated  into the left chest. Nodular tissue associated with the vasculature. There is circumferential soft tissue surrounding the left gastric artery after the origin from the celiac artery soft tissue essentially in cases the gastric artery just after the origin. Hazy infiltration of the mesenteric fat within the left upper quadrant. Enhancing peritoneal surfaces in the low abdomen/pelvis, involving nearly all the visualized at adnexa and the mesenteric fat associated with small bowel. Small volume ascites in the abdomen/pelvis. Unremarkable course caliber and contour of the abdominal aorta. No significant atherosclerotic changes. Proximal femoral vasculature patent. Nonocclusive venous thrombus within the left iliac system at the confluence of internal iliac vein in the external iliac vein. Proximal femoral veins unremarkable. Reproductive: Enhancing soft tissue nodularity associated with the right and left adnexa, with the surfaces of the broad ligaments in case with enhancing soft tissue. Other: Enhancing soft tissue within the umbilical region, likely pathologic lymph node. Musculoskeletal: No displaced fracture. No significant degenerative changes of the hips. Review of the MIP images confirms the above findings. IMPRESSION: The study is positive for right-sided pulmonary emboli involving segmental and subsegmental branches. Assessing for heart strain is limited given the distortion secondary to left-sided diaphragmatic hernia. If there is concern for right-sided heart strain, correlation with cardiac ECHO may be useful. Left-sided diaphragmatic hernia, involving majority of the stomach, splenic flexure, and large portion of the associated mesentery. 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 Alternative site of a presumed malignancy of the left upper quadrant could be the  splenic flexure. Correlation with endoscopy and possibly colonoscopy recommended. 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 Enhancing lesion within the umbilical region, likely metastatic involvement of lymph node. Small hypodense lesions of the right liver, too small to characterize and possibly benign cysts. Correlation with any future PET-CT may be useful. Low-density lesion of the lateral cortex of the right kidney. Differential diagnosis includes kidney infarction, infection, or tumor implant. Small nodule of the right upper lobe measuring 4 mm. Correlation with future imaging recommended. Nonocclusive left iliac DVT. These results were called by telephone at the time of interpretation on 08/28/2016 at 5:52 pm to Dr. Merrily Pew , who verbally acknowledged these results. Signed, Dulcy Fanny. Earleen Newport, DO [facility] Radiology Electronically Signed   By: Corrie Mckusick D.O.   On: 08/28/2016 17:57   Ir Ivc Filter Plmt / S&i /img Guid/mod Sed  Result Date: 08/29/2016 INDICATION: 66 year old female with pulmonary embolism and left iliac DVT. Risk factor of new malignancy. Anti coagulation contraindicated secondary to presumed gastrointestinal hemorrhage EXAM: ULTRASOUND GUIDED ACCESS RIGHT INTERNAL JUGULAR VEIN. PLACEMENT OF RETRIEVABLE IVC FILTER VIA IJ APPROACH MEDICATIONS: None. ANESTHESIA/SEDATION: 1.5 mg IV Versed; 75 mcg IV Fentanyl Moderate Sedation Time:  10 minutes The patient was continuously monitored during the procedure by the interventional radiology nurse under my direct supervision. FLUOROSCOPY TIME:  Fluoroscopy Time: 0 minutes 36 seconds (37 mGy). COMPLICATIONS: None PROCEDURE: The procedure, risks, benefits, and alternatives were explained to the patient. Specific  risks discussed include bleeding, infection, contrast reaction, renal failure, IVC filter fracture, migration, ileo caval thrombus (3% incidence), need for further procedure, need for further surgery, pulmonary  embolism, cardiopulmonary collapse, death. Questions regarding the procedure were encouraged and answered. The patient understands and consents to the procedure. Ultrasound survey was performed with images stored and sent to PACs. The neck was prepped with Betadine in a sterile fashion, and a sterile drape was applied covering the operative field. A sterile gown and sterile gloves were used for the procedure. Local anesthesia was provided with 1% Lidocaine. A micropuncture needle was used access the right internal jugular vein under ultrasound. With excellent venous blood flow returned, and an .018 micro wire was passed through the needle. Small incision was made with an 11 blade scalpel. The needle was removed, and a micropuncture sheath was placed over the wire. The inner dilator and wire were removed, and an 035 Bentson wire was advanced under fluoroscopy into the IVC. Serial dilation of the soft tissue tract was performed with an 8 [place] dilator and subsequently a 10 [place] dilator. The delivery sheath for a retrievable Bard Denali filter was passed over the Bentson wire into the IVC. The wire was removed and small contrast was used to confirm IVC location. IVC cavagram performed. Dilator was removed, and the IVC filter was then delivered, positioned below the lowest renal vein. Repeat cavagram performed, and the catheter was removed. Manual pressure was used for hemostasis. Patient tolerated the procedure well and remained hemodynamically stable throughout. No complications were encountered and no significant blood loss was encounter. IMPRESSION: Status post retrievable IVC filter placement via IJ approach. Signed, Dulcy Fanny. Earleen Newport DO [facility]  Radiology PLAN: This IVC filter is potentially retrievable. The patient may be assessed for filter retrieval by Interventional Radiology in approximately 8-12 weeks. Further recommendations regarding filter retrieval, continued surveillance or declaration of device permanence, can be made at that time. Electronically Signed   By: Corrie Mckusick D.O.   On: 08/29/2016 12:18   US Venous Img Lower Bilateral  Result Date: 08/17/2016 CLINICAL DATA:  Left plantar fasciitis.  Right leg swelling. EXAM: BILATERAL LOWER EXTREMITY VENOUS DOPPLER ULTRASOUND TECHNIQUE: Gray-scale sonography with graded compression, as well as color Doppler and duplex ultrasound were performed to evaluate the lower extremity deep venous systems from the level of the common femoral vein and including the common femoral, femoral, profunda femoral, popliteal and calf veins including the posterior tibial, peroneal and gastrocnemius veins when visible. The superficial great saphenous vein was also interrogated. Spectral Doppler was utilized to evaluate flow at rest and with distal augmentation maneuvers in the common femoral, femoral and popliteal veins. COMPARISON:  No prior. FINDINGS: RIGHT LOWER EXTREMITY Common Femoral Vein: No evidence of thrombus. Normal compressibility, respiratory phasicity and response to augmentation. Saphenofemoral Junction: No evidence of thrombus. Normal compressibility and flow on color Doppler imaging. Profunda Femoral Vein: No evidence of thrombus. Normal compressibility and flow on color Doppler imaging. Femoral Vein: No evidence of thrombus. Normal compressibility, respiratory phasicity and response to augmentation. Popliteal Vein: No evidence of thrombus. Normal compressibility, respiratory phasicity and response to augmentation. Calf Veins: No evidence of thrombus. Normal compressibility and flow on color Doppler imaging. Superficial Great Saphenous Vein: No evidence of thrombus. Normal compressibility and flow  on color Doppler imaging. Other Findings:  None. LEFT LOWER EXTREMITY Common Femoral Vein: No evidence of thrombus. Normal compressibility, respiratory phasicity and response to augmentation. Saphenofemoral Junction: No evidence of thrombus. Normal compressibility and flow on color Doppler imaging. Profunda Femoral Vein: No evidence of thrombus. Normal compressibility and flow on color Doppler imaging. Femoral Vein: No evidence of  thrombus. Normal compressibility, respiratory phasicity and response to augmentation. Popliteal Vein: Nonocclusive thrombus in the left popliteal vein. Calf Veins: Nonocclusive thrombus in the lead posterior tibial vein. Superficial Great Saphenous Vein: No evidence of thrombus. Normal compressibility and flow on color Doppler imaging. Other Findings:  None. IMPRESSION: Positive study for DVT left popliteal vein and left posterior tibial vein. Nonocclusive thrombus present. Right lower extremity unremarkable. Electronically Signed   By: Marcello Moores  Register   On: 08/17/2016 10:30    ASSESSMENT: 66 y.o. 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 She understands she has cancer that started in the stomach but has spread to her peritoneum including the adnexa. This is not different cancers but 1 cancer in many places.   More technically this is a stage IV, metastatic signet ring gastric carcinoma. We discussed the treatment options for gastric cancer in general and these include local therapies like surgery and radiation and systemic treatment like chemotherapy and immunotherapy. In stage IV disease there is little or no role for the local treatments. The  basis of treatment is chemotherapy and, in HER-2 positive cases, immunotherapy as well.  Unfortunately stage IV gastric carcinoma is not curable. The goal of treatment is control.  There are always 3 questions to go over and so we reviewed those today. The first question is do we treat or not. In some cases the patient's overall situation is so discouraging that palliative/comfort care alone is appropriate. If the decision is made to treat, then the next question is whether more or less aggressive therapy is appropriate. Once that decision is made than the third question is: Which agent or combination of agents in particular should be used?  The patient is very clear that she does desire treatment. She is planning to ask her family members from [place] to come in for her support here. She does get all her medical care in [place], and I will arrange for her to meet with our gastric cancer specialist, Dr. Kavin Leech, towards the end of next week. At that point a definitive treatment plan can be implemented  The patient will benefit from placement of a port. I will also ask social work to facilitate her completing advanced directives  Please and me know if I can be of further help at this point.      Chauncey Cruel, MD   08/31/2016 8:29 PM Medical Oncology and Hematology [facility] [address] [place], [place] [address] Tel. [phone]    Fax. [phone]

## 2016-08-31 NOTE — Care Management Note (Signed)
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 with large tumor burden- awaiting biopsy results= Oncology consulted                Action/Plan: PTA Pt lived at home- independent- CM to follow    Expected Discharge Date:                  Expected Discharge Plan:     In-House Referral:     Discharge planning Services  CM Consult  Post Acute Care Choice:    Choice offered to:     DME Arranged:    DME Agency:     HH Arranged:    [place] Agency:     Status of Service:  In process, will continue to follow  If discussed at Long Length of Stay Meetings, dates discussed:    Discharge Disposition:   Additional Comments:  Dawayne Patricia, RN 08/31/2016, 3:00 PM

## 2016-08-31 NOTE — Progress Notes (Signed)
PROGRESS NOTE    Jodi Collins  FBP:[id number] DOB: [birth year] DOA: 08/28/2016 PCP: Glendale Chard, MD   Brief Narrative:  66 year old BF PMHx Left DVT started on Xarelto after a long flight.   She presented to [facility] ED 6/11 with complaints of SOB x several days with associated cough, leg pain, and leg swelling. Upon arrival to the ED she was markedly dyspneic despite the application of 4L O2 via [place]. There was obviously concern for PE and she was started on heparin. CTA did demonstrate R sided pulmonary emboli, however, lab work resulted and was significant for hemoglobin of 4.4. Heparin infusion was stopped and she was set up for blood transfusion. Other significant lab workup includes CO2 16, creatinine 1.14, BMP 347, Troponin 0.22, WBC 13.2,  plt 180, INR 1.5. 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 Additionally, reports that for about the last week bowel movements have been almost black. States for the last few days has been dizzy and felt weak.    Subjective: 6/14 A/O 4, negative CP, negative SOB, negative LLE pain. Sitting in chair comfortably.    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  -Awaiting biopsy results.  -Consulted Oncology Dr Jana Hakim . 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 Patient has large tumor burden .  Acute blood loss anemia -Multifactorial to include Gastric/Esophageal mass, on anticoagulant -All anticoagulants discontinued patient now has IVC filter Recent Labs     08/28/16  1547  08/28/16  2301  08/29/16  1143  08/29/16  1825  08/30/16  0001  HGB  4.4*  5.2*  10.1*  10.4*  10.0*  -6/12 Transfused 3 units PRBC -currently stable  Lukocytosis - likely from stress, trend WBC and Fever Curve      DVT prophylaxis: IVC filter Code Status: Full Family Communication: None available Disposition Plan: Awaiting biopsy results.   Consultants:  [facility] M Dr.Wesam Nelda [place] GI Dr. Carol Ada    Procedures/Significant Events:  CTA chest and abdomen 6/11 > Right sided pulmonary emboli involving segmental and sub-segmental branches.  - large diaphragmatic hernia.  -Extensive soft tissue associated with the stomach body along the lesser curvature near the GE junction and surrounding the left gastric artery, concerning for primary gastric malignancy. 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  -Associated malignant ascites. Enhancing lesion within the umbilical region, likely metastatic involvement of lymph node. 6/12 transfused 3 units PRBC 6/12 for IVC filter 6/12 echocardiogram: LVEF:  60%-65%.  6/13 EGD:- 4 cm hiatal hernia.                           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                           - Malignant gastric tumor at the gastroesophageal                            junction, in the cardia, in the gastric fundus and                            in the gastric body. Biopsied.    VENTILATOR  SETTINGS:    Cultures None  Antimicrobials: None   Devices None   LINES / TUBES:  None    Continuous Infusions: . sodium chloride Stopped (08/28/16 2100)     Objective: Vitals:   08/31/16 0400 08/31/16 0500 08/31/16 0600 08/31/16 0700  BP: 110/75 117/75 104/67 110/74  Pulse: 79 89 87 93  Resp: 19 (!) 22 18 (!) 29  Temp: 97.7 F (36.5 C)   98.4 F (36.9 C)  TempSrc: Oral   Oral  SpO2: 95% 99% 95% 97%  Weight:  127 lb 8 oz (57.8 kg)    Height:       No intake or output data in the 24 hours ending 08/31/16 0841 Filed Weights   08/29/16 0205 08/30/16 0200 08/31/16 0500  Weight: 138 lb (62.6 kg) 138 lb (62.6 kg) 127 lb 8 oz (57.8 kg)    Examination:  General: A/O 4, negative acute respiratory distress, negative CP Eyes: negative scleral hemorrhage, negative anisocoria, negative icterus ENT: Negative Runny nose, negative gingival bleeding, Neck:  Negative scars, masses, torticollis, lymphadenopathy, JVD Lungs: Clear to auscultation bilaterally without wheezes or crackles Cardiovascular: Regular rate and rhythm without murmur gallop or rub normal S1 and S2 Abdomen: negative abdominal pain, nondistended, positive soft, bowel sounds, no rebound, no ascites, no appreciable mass Extremities: No significant cyanosis, clubbing, or edema bilateral lower extremities Skin: Negative rashes, lesions, ulcers Psychiatric:  Negative depression, negative anxiety, negative fatigue, negative mania  Central nervous system:  Cranial nerves II through XII intact, tongue/uvula midline, all extremities muscle strength 5/5, sensation intact throughout, negative dysarthria, negative expressive aphasia, negative receptive aphasia.      Data Reviewed: Care during the described time interval was provided by me .  I have reviewed this patient's available data, including medical history, events of note, physical examination, and all test results as part of my evaluation. I have personally  reviewed and interpreted all radiology studies.  CBC:  Recent Labs Lab 08/28/16 1547 08/28/16 2301 08/29/16 1143 08/29/16 1825 08/30/16 0001  WBC 13.2* 12.5* 14.0* 14.0* 13.1*  NEUTROABS 11.1*  --   --   --   --   HGB 4.4* 5.2* 10.1* 10.4* 10.0*  HCT 15.5* 17.6* 31.8* 32.7* 31.1*  MCV 84.2 80.7 82.4 82.2 82.7  PLT 180 117* 89* 88* 82*   Basic Metabolic Panel:  Recent Labs Lab 08/28/16 1547 08/29/16 0154 08/29/16 1143 08/30/16 0001  NA 138  --  139 137  K 4.4  --  4.3 4.1  CL 106  --  112* 111  CO2 16*  --  20* 19*  GLUCOSE 148*  --  113* 105*  BUN 21*  --  19 18  CREATININE 1.14*  --  0.99 0.99  CALCIUM 8.3*  --  7.9* 8.1*  MG  --  2.2  --   --   PHOS  --  3.7  --   --    GFR: Estimated Creatinine Clearance: 45.4 mL/min (by C-G formula based on SCr of 0.99 mg/dL). Liver Function Tests:  Recent Labs Lab 08/28/16 1547 08/29/16 1143  AST 54* 41  ALT 32 26  ALKPHOS 77 72  BILITOT 0.6 1.5*  PROT 7.1 6.3*  ALBUMIN 2.6* 2.3*   No results for input(s): LIPASE, AMYLASE in the last 168 hours. No results for input(s): AMMONIA in the last 168 hours. Coagulation Profile:  Recent Labs Lab 08/28/16 1547 08/30/16 0001  INR 1.50 1.37   Cardiac Enzymes:  Recent Labs Lab 08/28/16 1547  TROPONINI 0.22*   BNP (last 3 results) No results for input(s): PROBNP in the last 8760 hours. HbA1C: No results for input(s): HGBA1C in the last 72 hours. CBG:  Recent Labs Lab 08/30/16 1605 08/30/16 2031 08/31/16 0006 08/31/16 0419 08/31/16 0805  GLUCAP 120* 116* 109* 106* 103*   Lipid Profile: No results for input(s): CHOL, HDL, LDLCALC, TRIG, CHOLHDL, LDLDIRECT in the last 72 hours. Thyroid Function Tests: No results for input(s): TSH, T4TOTAL, FREET4, T3FREE, THYROIDAB in the last 72 hours. Anemia Panel:  Recent Labs  08/28/16 1642 08/28/16 1740  VITAMINB12 5,758*  --   FOLATE  --  18.4  FERRITIN 26  --   TIBC 262  --   IRON 16*  --   RETICCTPCT 12.9*   --    Urine analysis:    Component Value Date/Time   COLORURINE YELLOW 08/29/2016 0000   APPEARANCEUR CLEAR 08/29/2016 0000   LABSPEC >1.046 (H) 08/29/2016 0000   PHURINE 6.0 08/29/2016 0000   GLUCOSEU NEGATIVE 08/29/2016 0000   HGBUR NEGATIVE 08/29/2016 0000   BILIRUBINUR NEGATIVE 08/29/2016 0000   KETONESUR NEGATIVE 08/29/2016 0000   PROTEINUR NEGATIVE 08/29/2016 0000   NITRITE NEGATIVE 08/29/2016 0000   LEUKOCYTESUR MODERATE (A) 08/29/2016 0000   Sepsis Labs: @LABRCNTIP (procalcitonin:4,lacticidven:4)  ) Recent Results (from the past 240 hour(s))  MRSA PCR Screening     Status: None   Collection Time: 08/28/16  8:50 PM  Result Value Ref Range Status   MRSA by PCR NEGATIVE NEGATIVE Final    Comment:        The GeneXpert MRSA Assay (FDA approved for NASAL specimens only), is one component of a comprehensive MRSA colonization surveillance program. It is not intended to diagnose MRSA infection nor to guide or monitor treatment for MRSA infections.          Radiology Studies: Ir Ivc Filter Plmt / S&i /img Guid/mod Sed  Result Date: 08/29/2016 INDICATION: 66 year old female with pulmonary embolism and left iliac DVT. Risk factor of new malignancy. Anti coagulation contraindicated secondary to presumed gastrointestinal hemorrhage EXAM: ULTRASOUND GUIDED ACCESS RIGHT INTERNAL JUGULAR VEIN. PLACEMENT OF RETRIEVABLE IVC FILTER VIA IJ APPROACH MEDICATIONS: None. ANESTHESIA/SEDATION: 1.5 mg IV Versed; 75 mcg IV Fentanyl Moderate Sedation Time:  10 minutes The patient was continuously monitored during the procedure by the interventional radiology nurse under my direct supervision. FLUOROSCOPY TIME:  Fluoroscopy Time: 0 minutes 36 seconds (37 mGy). COMPLICATIONS: None PROCEDURE: The procedure, risks, benefits, and alternatives were explained to the patient. Specific risks discussed include bleeding, infection, contrast reaction, renal failure, IVC filter fracture, migration, ileo  caval thrombus (3% incidence), need for further procedure, need for further surgery, pulmonary embolism,  cardiopulmonary collapse, death. Questions regarding the procedure were encouraged and answered. The patient understands and consents to the procedure. Ultrasound survey was performed with images stored and sent to PACs. The neck was prepped with Betadine in a sterile fashion, and a sterile drape was applied covering the operative field. A sterile gown and sterile gloves were used for the procedure. Local anesthesia was provided with 1% Lidocaine. A micropuncture needle was used access the right internal jugular vein under ultrasound. With excellent venous blood flow returned, and an .018 micro wire was passed through the needle. Small incision was made with an 11 blade scalpel. The needle was removed, and a micropuncture sheath was placed over the wire. The inner dilator and wire were removed, and an 035 Bentson wire was advanced under fluoroscopy into the IVC. Serial dilation of the soft tissue tract was performed with an 8 [place] dilator and subsequently a 10 [place] dilator. The delivery sheath for a retrievable Bard Denali filter was passed over the Bentson wire into the IVC. The wire was removed and small contrast was used to confirm IVC location. IVC cavagram performed. Dilator was removed, and the IVC filter was then delivered, positioned below the lowest renal vein. Repeat cavagram performed, and the catheter was removed. Manual pressure was used for hemostasis. Patient tolerated the procedure well and remained hemodynamically stable throughout. No complications were encountered and no significant blood loss was encounter. IMPRESSION: Status post retrievable IVC filter placement via IJ approach. Signed, Dulcy Fanny. Earleen Newport DO [facility] Radiology PLAN: This IVC filter is potentially retrievable. The patient may be assessed for filter retrieval by  Interventional Radiology in approximately 8-12 weeks. Further recommendations regarding filter retrieval, continued surveillance or declaration of device permanence, can be made at that time. Electronically Signed   By: Corrie Mckusick D.O.   On: 08/29/2016 12:18        Scheduled Meds: . pantoprazole (PROTONIX) IV  40 mg Intravenous Q24H   Continuous Infusions: . sodium chloride Stopped (08/28/16 2100)     LOS: 3 days    Time spent: 40 minutes    Anastasija Anfinson, Geraldo Docker, MD [facility] Pager [phone]   If 7PM-7AM, please contact night-coverage [email or web address] Password [facility] 08/31/2016, 8:41 AM

## 2016-09-01 ENCOUNTER — Encounter (HOSPITAL_COMMUNITY): Payer: Self-pay | Admitting: Interventional Radiology

## 2016-09-01 ENCOUNTER — Inpatient Hospital Stay (HOSPITAL_COMMUNITY): Payer: 59

## 2016-09-01 DIAGNOSIS — R0602 Shortness of breath: Secondary | ICD-10-CM

## 2016-09-01 HISTORY — PX: IR US GUIDE VASC ACCESS RIGHT: IMG2390

## 2016-09-01 HISTORY — PX: IR FLUORO GUIDE PORT INSERTION RIGHT: IMG5741

## 2016-09-01 LAB — BPAM RBC
BLOOD PRODUCT EXPIRATION DATE: 201806202359
BLOOD PRODUCT EXPIRATION DATE: 201806272359
ISSUE DATE / TIME: 201806150928
ISSUE DATE / TIME: 201806111854
UNIT TYPE AND RH: 6200
UNIT TYPE AND RH: 6200

## 2016-09-01 LAB — BASIC METABOLIC PANEL
ANION GAP: 9 (ref 5–15)
BUN: 14 mg/dL (ref 6–20)
CHLORIDE: 105 mmol/L (ref 101–111)
CO2: 22 mmol/L (ref 22–32)
Calcium: 8 mg/dL — ABNORMAL LOW (ref 8.9–10.3)
Creatinine, Ser: 0.86 mg/dL (ref 0.44–1.00)
GFR calc non Af Amer: >60 mL/min (ref >60)
GLUCOSE: 106 mg/dL — AB (ref 65–99)
POTASSIUM: 4.1 mmol/L (ref 3.5–5.1)
Sodium: 136 mmol/L (ref 135–145)

## 2016-09-01 LAB — TYPE AND SCREEN
ABO/RH(D): A POS
Antibody Screen: NEGATIVE
UNIT DIVISION: 0
Unit division: 0

## 2016-09-01 LAB — CBC
HEMATOCRIT: 30.6 % — AB (ref 36.0–46.0)
Hemoglobin: 9.5 g/dL — ABNORMAL LOW (ref 12.0–15.0)
MCH: 26.2 pg (ref 26.0–34.0)
MCHC: 31 g/dL (ref 30.0–36.0)
MCV: 84.3 fL (ref 78.0–100.0)
Platelets: 107 10*3/uL — ABNORMAL LOW (ref 150–400)
RBC: 3.63 MIL/uL — ABNORMAL LOW (ref 3.87–5.11)
RDW: 20.2 % — AB (ref 11.5–15.5)
WBC: 8.7 10*3/uL (ref 4.0–10.5)

## 2016-09-01 LAB — MAGNESIUM: Magnesium: 2 mg/dL (ref 1.7–2.4)

## 2016-09-01 MED ORDER — SODIUM CHLORIDE 0.9 % IV SOLN
INTRAVENOUS | Status: AC | PRN
  Administered 2016-09-01: 10 mL/h via INTRAVENOUS

## 2016-09-01 MED ORDER — MIDAZOLAM HCL 2 MG/2ML IJ SOLN
INTRAMUSCULAR | Status: AC | PRN
  Administered 2016-09-01: 1 mg via INTRAVENOUS

## 2016-09-01 MED ORDER — CEFAZOLIN SODIUM-DEXTROSE 2-4 GM/100ML-% IV SOLN
2.0000 g | INTRAVENOUS | Status: AC
  Administered 2016-09-01: 2 g via INTRAVENOUS

## 2016-09-01 MED ORDER — MIDAZOLAM HCL 2 MG/2ML IJ SOLN
INTRAMUSCULAR | Status: AC
  Filled 2016-09-01: qty 2

## 2016-09-01 MED ORDER — FENTANYL CITRATE (PF) 100 MCG/2ML IJ SOLN
INTRAMUSCULAR | Status: AC | PRN
Start: 2016-09-01 — End: 2016-09-01
  Administered 2016-09-01: 25 ug via INTRAVENOUS

## 2016-09-01 MED ORDER — FENTANYL CITRATE (PF) 100 MCG/2ML IJ SOLN
INTRAMUSCULAR | Status: AC
  Filled 2016-09-01: qty 2

## 2016-09-01 MED ORDER — LIDOCAINE HCL 1 % IJ SOLN
INTRAMUSCULAR | Status: AC
  Filled 2016-09-01: qty 20

## 2016-09-01 MED ORDER — CEFAZOLIN SODIUM-DEXTROSE 2-4 GM/100ML-% IV SOLN
INTRAVENOUS | Status: AC
  Filled 2016-09-01: qty 100

## 2016-09-01 MED ORDER — HEPARIN SOD (PORK) LOCK FLUSH 100 UNIT/ML IV SOLN
INTRAVENOUS | Status: AC
  Filled 2016-09-01: qty 5

## 2016-09-01 NOTE — Discharge Planning (Signed)
Pt given discharge instructions and follow up info.. Denies questions. Friend to take pt home.

## 2016-09-01 NOTE — Progress Notes (Signed)
   09/01/16 1000  Clinical Encounter Type  Visited With Patient  Visit Type Initial;Spiritual support  Referral From Nurse  Spiritual Encounters  Spiritual Needs Emotional  Stress Factors  Patient Stress Factors Health changes    Palliative consult. Provided emotional support and ministry of presence. Shaaron Golliday L. Volanda Napoleon, MDiv

## 2016-09-01 NOTE — Discharge Summary (Signed)
Physician Discharge Summary  [name] QQP:[id number] DOB: 09/07/1950 DOA: 08/28/2016  PCP: Glendale Chard, MD  Admit date: 08/28/2016 Discharge date: 09/01/2016  Admitted From: home Disposition:  home  Recommendations for Outpatient Follow-up:  1. Follow up with PCP in 1 week 2. Follow up with Dr. Kavin Leech (oncology) next week  3. Repeat CBC, BMP as outpatient in 1 week 4. Patient may be seen by [facility] when a candidate for filter removal and initiation of anti-coagulation. [phone]  Discharge Condition: Stable CODE STATUS: Full  Diet recommendation: Heart healthy   Brief/Interim Summary: Jodi Collins is a 66 year old female with PMH as below, which is significant for recent dx left DVT started on Xarelto after a long flight. She presented to [facility] ED 6/11 with complaints of SOB x several days with associated cough, leg pain, and leg swelling. Upon arrival to the ED she was markedly dyspneic despite the application of 4L O2 via . There was obviously concern for PE and she was started on heparin. CTA did demonstrate R sided pulmonary emboli, however, lab work resulted and was significant for hemoglobin of 4.4. Heparin infusion was stopped and she was set up for blood transfusion. She was transferred to [facility] for ICU admission. PCCM to admit, [facility] assumed care on 6/14.  Due to her report for dark stools, anticoagulation was stopped in light of GI bleeding and IR was consulted for IVC filter placement on 6/12. She underwent EGD on 6/13 which found a large, fungating, non-circumferential mass in her stomach. She was evaluated by oncology. Lastly, she underwent port placement in this patient for systemic chemotherapy.  Discharge Diagnoses:  Active Problems:   Pulmonary emboli (HCC)   Anemia  Acute hypoxemic respiratory failure. -Secondary to pulmonary embolism, anemia. -Resolved  Pulmonary embolism, left DVT. -No anticoagulation due to GI  bleeding. -IVC filter placed 6/12.  Stage IV signet ring gastric adenocarcinoma. -Need to follow up with Dr. Benay Spice as outpatient -IR for port placement prior to discharge  Acute blood loss anemia -No anticoagulation. -Stable after transfusions -Hemoglobin stable   Discharge Instructions  Discharge Instructions    Call MD for:  difficulty breathing, headache or visual disturbances    Complete by:  As directed    Call MD for:  extreme fatigue    Complete by:  As directed    Call MD for:  hives    Complete by:  As directed    Call MD for:  persistant dizziness or light-headedness    Complete by:  As directed    Call MD for:  persistant nausea and vomiting    Complete by:  As directed    Call MD for:  redness, tenderness, or signs of infection (pain, swelling, redness, odor or green/yellow discharge around incision site)    Complete by:  As directed    Call MD for:  severe uncontrolled pain    Complete by:  As directed    Call MD for:  temperature >100.4    Complete by:  As directed    Diet - low sodium heart healthy    Complete by:  As directed    Discharge instructions    Complete by:  As directed    You were cared for by a hospitalist during your hospital stay. If you have any questions about your discharge medications or the care you received while you were in the hospital after you are discharged, you can call the unit and asked to speak with  the hospitalist on call if the hospitalist that took care of you is not available. Once you are discharged, your primary care physician will handle any further medical issues. Please note that NO REFILLS for any discharge medications will be authorized once you are discharged, as it is imperative that you return to your primary care physician (or establish a relationship with a primary care physician if you do not have one) for your aftercare needs so that they can reassess your need for medications and monitor your lab values.    Increase activity slowly    Complete by:  As directed    Other Restrictions    Complete by:  As directed    You may shower tomorrow 6/16     Allergies as of 09/01/2016   No Known Allergies     Medication List    STOP taking these medications   CALCIUM + D PO   ibuprofen 200 MG tablet Commonly known as:  ADVIL,MOTRIN   multivitamin with minerals Tabs tablet      Follow-up Information    Glendale Chard, MD. Schedule an appointment as soon as possible for a visit in 1 week(s).   Specialty:  Internal Medicine Contact information: [address] [place] [address] [phone]        Ladell Pier, MD. Schedule an appointment as soon as possible for a visit in 1 week(s).   Specialty:  Oncology Contact information: [address] [place] [address] [phone]          No Known Allergies  Consultations:  PCCM admit  GI  Oncology   IR   Procedures/Studies: Ct Angio Chest W/cm &/or Wo Cm  Result Date: 08/28/2016 CLINICAL DATA:  66 year old female with a history of shortness of breath EXAM: CT ANGIOGRAPHY CHEST CT ABDOMEN AND PELVIS WITH CONTRAST TECHNIQUE: Multidetector CT imaging of the chest was performed using the standard protocol during bolus administration of intravenous contrast. Multiplanar CT image reconstructions and MIPs were obtained to evaluate the vascular anatomy. Multidetector CT imaging of the abdomen and pelvis was performed using the standard protocol during bolus administration of intravenous contrast. CONTRAST:  100 cc Isovue 370 COMPARISON:  None. FINDINGS: CTA CHEST FINDINGS Cardiovascular: Heart: No cardiomegaly. No pericardial fluid/thickening. No significant coronary calcifications. Right and left ventricles are somewhat compressed by the large left-sided diaphragmatic herniation. Estimated diameter of the left ventricle measures 20 mm with estimated diameter of the right ventricle measuring 43 mm. Aorta:  Unremarkable course, caliber, contour of the thoracic aorta. No aneurysm or dissection flap. No periaortic fluid. Pulmonary arteries: Filling defects of the right-sided pulmonary arteries involving the segmental branches of the right lower lobe, right middle lobe, right upper lobe. No filling defects within left-sided pulmonary arteries identified. Mediastinum/Nodes: Mediastinal structures somewhat compressed by the diaphragmatic hernia. Lymph nodes are present within the mediastinum, with the lowest peritracheal lymph nodes borderline enlarged. Lungs/Pleura: Right lung with no pleural effusion or confluent airspace disease. Atelectatic changes at the base of the left lung.  No pneumothorax. Small nodule within the right upper lobe (image 30 of series 6). Musculoskeletal: No displaced fracture. Degenerative changes of the spine. Review of the MIP images confirms the above findings. CT ABDOMEN and PELVIS FINDINGS Hepatobiliary: Rounded hypodense lesion of the inferior right liver measures 7 mm (image 22 of series 12) and 10 mm (image 26 of series 12). Unremarkable appearance the gallbladder. Pancreas: Unremarkable appearance of the pancreas. Spleen: Unremarkable appearance of spleen Adrenals/Urinary Tract: Unremarkable  appearance the bilateral adrenal glands. No evidence of left-sided or right-sided hydronephrosis. No nephrolithiasis. No perinephric stranding. On the right there is an ill-defined hypodense/ hypoenhancing region at the posterior/ lateral cortex of the kidney measuring 10 mm. Stomach/Bowel: Herniation of the majority of the stomach into the left chest through diaphragmatic hernia. The splenic flexure is herniated into the left chest. No evidence of abnormally distended small bowel or colon. Vascular/Lymphatic: Extensive nodularity of the mesenteric fat associated with the stomach, splenic flexure, and the mesenteric fat herniated into the left chest. Nodular tissue associated with the vasculature.  There is circumferential soft tissue surrounding the left gastric artery after the origin from the celiac artery soft tissue essentially in cases the gastric artery just after the origin. Hazy infiltration of the mesenteric fat within the left upper quadrant. Enhancing peritoneal surfaces in the low abdomen/pelvis, involving nearly all the visualized at adnexa and the mesenteric fat associated with small bowel. Small volume ascites in the abdomen/pelvis. Unremarkable course caliber and contour of the abdominal aorta. No significant atherosclerotic changes. Proximal femoral vasculature patent. Nonocclusive venous thrombus within the left iliac system at the confluence of internal iliac vein in the external iliac vein. Proximal femoral veins unremarkable. Reproductive: Enhancing soft tissue nodularity associated with the right and left adnexa, with the surfaces of the broad ligaments in case with enhancing soft tissue. Other: Enhancing soft tissue within the umbilical region, likely pathologic lymph node. Musculoskeletal: No displaced fracture. No significant degenerative changes of the hips. Review of the MIP images confirms the above findings. IMPRESSION: The study is positive for right-sided pulmonary emboli involving segmental and subsegmental branches. Assessing for heart strain is limited given the distortion secondary to left-sided diaphragmatic hernia. If there is concern for right-sided heart strain, correlation with cardiac ECHO may be useful. Left-sided diaphragmatic hernia, involving majority of the stomach, splenic flexure, and large portion of the associated mesentery. 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 Alternative site of a presumed malignancy of the left upper quadrant could be the splenic flexure. Correlation with endoscopy and possibly colonoscopy  recommended. 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 Enhancing lesion within the umbilical region, likely metastatic involvement of lymph node. Small hypodense lesions of the right liver, too small to characterize and possibly benign cysts. Correlation with any future PET-CT may be useful. Low-density lesion of the lateral cortex of the right kidney. Differential diagnosis includes kidney infarction, infection, or tumor implant. Small nodule of the right upper lobe measuring 4 mm. Correlation with future imaging recommended. Nonocclusive left iliac DVT. These results were called by telephone at the time of interpretation on 08/28/2016 at 5:52 pm to Dr. Merrily Pew , who verbally acknowledged these results. Signed, Dulcy Fanny. Earleen Newport, DO [facility] Radiology Electronically Signed   By: Corrie Mckusick D.O.   On: 08/28/2016 17:57   Ct Abdomen Pelvis W Contrast  Result Date: 08/28/2016 CLINICAL DATA:  66 year old female with a history of shortness of breath EXAM: CT ANGIOGRAPHY CHEST CT ABDOMEN AND PELVIS WITH CONTRAST TECHNIQUE: Multidetector CT imaging of the chest was performed using the standard protocol during bolus administration of intravenous contrast. Multiplanar CT image reconstructions and MIPs were obtained to evaluate the vascular anatomy. Multidetector  CT imaging of the abdomen and pelvis was performed using the standard protocol during bolus administration of intravenous contrast. CONTRAST:  100 cc Isovue 370 COMPARISON:  None. FINDINGS: CTA CHEST FINDINGS Cardiovascular: Heart: No cardiomegaly. No pericardial fluid/thickening. No significant coronary calcifications. Right and left ventricles are somewhat compressed by the large left-sided diaphragmatic herniation.  Estimated diameter of the left ventricle measures 20 mm with estimated diameter of the right ventricle measuring 43 mm. Aorta: Unremarkable course, caliber, contour of the thoracic aorta. No aneurysm or dissection flap. No periaortic fluid. Pulmonary arteries: Filling defects of the right-sided pulmonary arteries involving the segmental branches of the right lower lobe, right middle lobe, right upper lobe. No filling defects within left-sided pulmonary arteries identified. Mediastinum/Nodes: Mediastinal structures somewhat compressed by the diaphragmatic hernia. Lymph nodes are present within the mediastinum, with the lowest peritracheal lymph nodes borderline enlarged. Lungs/Pleura: Right lung with no pleural effusion or confluent airspace disease. Atelectatic changes at the base of the left lung.  No pneumothorax. Small nodule within the right upper lobe (image 30 of series 6). Musculoskeletal: No displaced fracture. Degenerative changes of the spine. Review of the MIP images confirms the above findings. CT ABDOMEN and PELVIS FINDINGS Hepatobiliary: Rounded hypodense lesion of the inferior right liver measures 7 mm (image 22 of series 12) and 10 mm (image 26 of series 12). Unremarkable appearance the gallbladder. Pancreas: Unremarkable appearance of the pancreas. Spleen: Unremarkable appearance of spleen Adrenals/Urinary Tract: Unremarkable appearance the bilateral adrenal glands. No evidence of left-sided or right-sided hydronephrosis. No nephrolithiasis. No perinephric stranding. On the right there is an ill-defined hypodense/ hypoenhancing region at the posterior/ lateral cortex of the kidney measuring 10 mm. Stomach/Bowel: Herniation of the majority of the stomach into the left chest through diaphragmatic hernia. The splenic flexure is herniated into the left chest. No evidence of abnormally distended small bowel or colon. Vascular/Lymphatic: Extensive nodularity of the mesenteric fat associated with the  stomach, splenic flexure, and the mesenteric fat herniated into the left chest. Nodular tissue associated with the vasculature. There is circumferential soft tissue surrounding the left gastric artery after the origin from the celiac artery soft tissue essentially in cases the gastric artery just after the origin. Hazy infiltration of the mesenteric fat within the left upper quadrant. Enhancing peritoneal surfaces in the low abdomen/pelvis, involving nearly all the visualized at adnexa and the mesenteric fat associated with small bowel. Small volume ascites in the abdomen/pelvis. Unremarkable course caliber and contour of the abdominal aorta. No significant atherosclerotic changes. Proximal femoral vasculature patent. Nonocclusive venous thrombus within the left iliac system at the confluence of internal iliac vein in the external iliac vein. Proximal femoral veins unremarkable. Reproductive: Enhancing soft tissue nodularity associated with the right and left adnexa, with the surfaces of the broad ligaments in case with enhancing soft tissue. Other: Enhancing soft tissue within the umbilical region, likely pathologic lymph node. Musculoskeletal: No displaced fracture. No significant degenerative changes of the hips. Review of the MIP images confirms the above findings. IMPRESSION: The study is positive for right-sided pulmonary emboli involving segmental and subsegmental branches. Assessing for heart strain is limited given the distortion secondary to left-sided diaphragmatic hernia. If there is concern for right-sided heart strain, correlation with cardiac ECHO may be useful. Left-sided diaphragmatic hernia, involving majority of the stomach, splenic flexure, and large portion of the associated mesentery. Extensive soft tissue associated with the stomach body along the lesser curvature near the GE junction and surrounding the left gastric artery, concerning for  primary gastric malignancy. Alternative site of a  presumed malignancy of the left upper quadrant could be the splenic flexure. Correlation with endoscopy and possibly colonoscopy recommended. 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 Enhancing lesion within the umbilical region, likely metastatic involvement of lymph node. Small hypodense lesions of the right liver, too small to characterize and possibly benign cysts. Correlation with any future PET-CT may be useful. Low-density lesion of the lateral cortex of the right kidney. Differential diagnosis includes kidney infarction, infection, or tumor implant. Small nodule of the right upper lobe measuring 4 mm. Correlation with future imaging recommended. Nonocclusive left iliac DVT. These results were called by telephone at the time of interpretation on 08/28/2016 at 5:52 pm to Dr. Merrily Pew , who verbally acknowledged these results. Signed, Dulcy Fanny. Earleen Newport, DO [facility] Radiology Electronically Signed   By: Corrie Mckusick D.O.   On: 08/28/2016 17:57   Ir Ivc Filter Plmt / S&i /img Guid/mod Sed  Result Date: 08/29/2016 INDICATION: 66 year old female with pulmonary embolism and left iliac DVT. Risk factor of new malignancy. Anti coagulation contraindicated secondary to presumed gastrointestinal hemorrhage EXAM: ULTRASOUND GUIDED ACCESS RIGHT INTERNAL JUGULAR VEIN. PLACEMENT OF RETRIEVABLE IVC FILTER VIA IJ APPROACH MEDICATIONS: None. ANESTHESIA/SEDATION: 1.5 mg IV Versed; 75 mcg IV Fentanyl Moderate Sedation Time:  10 minutes The patient was continuously monitored during the procedure by the interventional radiology nurse under my direct supervision. FLUOROSCOPY TIME:  Fluoroscopy Time: 0 minutes 36 seconds (37 mGy). COMPLICATIONS: None PROCEDURE: The procedure, risks,  benefits, and alternatives were explained to the patient. Specific risks discussed include bleeding, infection, contrast reaction, renal failure, IVC filter fracture, migration, ileo caval thrombus (3% incidence), need for further procedure, need for further surgery, pulmonary embolism, cardiopulmonary collapse, death. Questions regarding the procedure were encouraged and answered. The patient understands and consents to the procedure. Ultrasound survey was performed with images stored and sent to PACs. The neck was prepped with Betadine in a sterile fashion, and a sterile drape was applied covering the operative field. A sterile gown and sterile gloves were used for the procedure. Local anesthesia was provided with 1% Lidocaine. A micropuncture needle was used access the right internal jugular vein under ultrasound. With excellent venous blood flow returned, and an .018 micro wire was passed through the needle. Small incision was made with an 11 blade scalpel. The needle was removed, and a micropuncture sheath was placed over the wire. The inner dilator and wire were removed, and an 035 Bentson wire was advanced under fluoroscopy into the IVC. Serial dilation of the soft tissue tract was performed with an 8 [place] dilator and subsequently a 10 [place] dilator. The delivery sheath for a retrievable Bard Denali filter was passed over the Bentson wire into the IVC. The wire was removed and small contrast was used to confirm IVC location. IVC cavagram performed. Dilator was removed, and the IVC filter was then delivered, positioned below the lowest renal vein. Repeat cavagram performed, and the catheter was removed. Manual pressure was used for hemostasis. Patient tolerated the procedure well and remained hemodynamically stable throughout. No complications were encountered and no significant blood loss was encounter. IMPRESSION: Status post retrievable IVC filter placement via IJ approach. Signed, Dulcy Fanny. Earleen Newport DO  [facility] Radiology PLAN: This IVC filter is potentially retrievable.  The patient may be assessed for filter retrieval by Interventional Radiology in approximately 8-12 weeks. Further recommendations regarding filter retrieval, continued surveillance or declaration of device permanence, can be made at that time. Electronically Signed   By: Corrie Mckusick D.O.   On: 08/29/2016 12:18   US Venous Img Lower Bilateral  Result Date: 08/17/2016 CLINICAL DATA:  Left plantar fasciitis.  Right leg swelling. EXAM: BILATERAL LOWER EXTREMITY VENOUS DOPPLER ULTRASOUND TECHNIQUE: Gray-scale sonography with graded compression, as well as color Doppler and duplex ultrasound were performed to evaluate the lower extremity deep venous systems from the level of the common femoral vein and including the common femoral, femoral, profunda femoral, popliteal and calf veins including the posterior tibial, peroneal and gastrocnemius veins when visible. The superficial great saphenous vein was also interrogated. Spectral Doppler was utilized to evaluate flow at rest and with distal augmentation maneuvers in the common femoral, femoral and popliteal veins. COMPARISON:  No prior. FINDINGS: RIGHT LOWER EXTREMITY Common Femoral Vein: No evidence of thrombus. Normal compressibility, respiratory phasicity and response to augmentation. Saphenofemoral Junction: No evidence of thrombus. Normal compressibility and flow on color Doppler imaging. Profunda Femoral Vein: No evidence of thrombus. Normal compressibility and flow on color Doppler imaging. Femoral Vein: No evidence of thrombus. Normal compressibility, respiratory phasicity and response to augmentation. Popliteal Vein: No evidence of thrombus. Normal compressibility, respiratory phasicity and response to augmentation. Calf Veins: No evidence of thrombus. Normal compressibility and flow on color Doppler imaging. Superficial Great Saphenous  Vein: No evidence of thrombus. Normal compressibility and flow on color Doppler imaging. Other Findings:  None. LEFT LOWER EXTREMITY Common Femoral Vein: No evidence of thrombus. Normal compressibility, respiratory phasicity and response to augmentation. Saphenofemoral Junction: No evidence of thrombus. Normal compressibility and flow on color Doppler imaging. Profunda Femoral Vein: No evidence of thrombus. Normal compressibility and flow on color Doppler imaging. Femoral Vein: No evidence of thrombus. Normal compressibility, respiratory phasicity and response to augmentation. Popliteal Vein: Nonocclusive thrombus in the left popliteal vein. Calf Veins: Nonocclusive thrombus in the lead posterior tibial vein. Superficial Great Saphenous Vein: No evidence of thrombus. Normal compressibility and flow on color Doppler imaging. Other Findings:  None. IMPRESSION: Positive study for DVT left popliteal vein and left posterior tibial vein. Nonocclusive thrombus present. Right lower extremity unremarkable. Electronically Signed   By: Marcello Moores  Register   On: 08/17/2016 10:30    Echo 6/12 Study Conclusions  - Left ventricle: The cavity size was normal. Systolic function was   normal. The estimated ejection fraction was in the range of 60%   to 65%. Wall motion was normal; there were no regional wall   motion abnormalities. Left ventricular diastolic function   parameters were normal. - Atrial septum: No defect or patent foramen ovale was identified.   EGD 6/13 - 4 cm hiatal hernia. 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 - Malignant gastric tumor at the gastroesophageal junction, in the cardia, in the gastric fundus and in the gastric body. Biopsied. - Normal examined duodenum.  Discharge Exam: Vitals:   09/01/16 1330 09/01/16 1335  BP: 105/66 104/71  Pulse: 88 89  Resp: (!) 24 (!) 21  Temp:     Vitals:   09/01/16 1320 09/01/16 1325 09/01/16 1330 09/01/16 1335  BP:  110/73 108/75 105/66 104/71  Pulse: 90 88 88 89  Resp: (!) 23 (!) 25 (!) 24 (!) 21  Temp:      TempSrc:      SpO2: 96% 97%  98% 95%  Weight:      Height:        General: Pt is alert, awake, not in acute distress Cardiovascular: RRR, S1/S2 +, no rubs, no gallops Respiratory: CTA bilaterally, no wheezing, no rhonchi Abdominal: Soft, NT, ND, bowel sounds + Extremities: no edema, no cyanosis    The results of significant diagnostics from this hospitalization (including imaging, microbiology, ancillary and laboratory) are listed below for reference.     Microbiology: Recent Results (from the past 240 hour(s))  MRSA PCR Screening     Status: None   Collection Time: 08/28/16  8:50 PM  Result Value Ref Range Status   MRSA by PCR NEGATIVE NEGATIVE Final    Comment:        The GeneXpert MRSA Assay (FDA approved for NASAL specimens only), is one component of a comprehensive MRSA colonization surveillance program. It is not intended to diagnose MRSA infection nor to guide or monitor treatment for MRSA infections.      Labs: BNP (last 3 results)  Recent Labs  08/28/16 1547  BNP 884.1*   Basic Metabolic Panel:  Recent Labs Lab 08/28/16 1547 08/29/16 0154 08/29/16 1143 08/30/16 0001 09/01/16 0410  NA 138  --  139 137 136  K 4.4  --  4.3 4.1 4.1  CL 106  --  112* 111 105  CO2 16*  --  20* 19* 22  GLUCOSE 148*  --  113* 105* 106*  BUN 21*  --  19 18 14   CREATININE 1.14*  --  0.99 0.99 0.86  CALCIUM 8.3*  --  7.9* 8.1* 8.0*  MG  --  2.2  --   --  2.0  PHOS  --  3.7  --   --   --    Liver Function Tests:  Recent Labs Lab 08/28/16 1547 08/29/16 1143  AST 54* 41  ALT 32 26  ALKPHOS 77 72  BILITOT 0.6 1.5*  PROT 7.1 6.3*  ALBUMIN 2.6* 2.3*   No results for input(s): LIPASE, AMYLASE in the last 168 hours. No results for input(s): AMMONIA in the last 168 hours. CBC:  Recent Labs Lab 08/28/16 1547 08/28/16 2301 08/29/16 1143 08/29/16 1825 08/30/16 0001  09/01/16 0410  WBC 13.2* 12.5* 14.0* 14.0* 13.1* 8.7  NEUTROABS 11.1*  --   --   --   --   --   HGB 4.4* 5.2* 10.1* 10.4* 10.0* 9.5*  HCT 15.5* 17.6* 31.8* 32.7* 31.1* 30.6*  MCV 84.2 80.7 82.4 82.2 82.7 84.3  PLT 180 117* 89* 88* 82* 107*   Cardiac Enzymes:  Recent Labs Lab 08/28/16 1547  TROPONINI 0.22*   BNP: Invalid input(s): POCBNP CBG:  Recent Labs Lab 08/30/16 2031 08/31/16 0006 08/31/16 0419 08/31/16 0805 08/31/16 1156  GLUCAP 116* 109* 106* 103* 95   D-Dimer No results for input(s): DDIMER in the last 72 hours. Hgb A1c No results for input(s): HGBA1C in the last 72 hours. Lipid Profile No results for input(s): CHOL, HDL, LDLCALC, TRIG, CHOLHDL, LDLDIRECT in the last 72 hours. Thyroid function studies No results for input(s): TSH, T4TOTAL, T3FREE, THYROIDAB in the last 72 hours.  Invalid input(s): FREET3 Anemia work up No results for input(s): VITAMINB12, FOLATE, FERRITIN, TIBC, IRON, RETICCTPCT in the last 72 hours. Urinalysis    Component Value Date/Time   COLORURINE YELLOW 08/29/2016 0000   APPEARANCEUR CLEAR 08/29/2016 0000   LABSPEC >1.046 (H) 08/29/2016 0000   PHURINE 6.0 08/29/2016 0000   GLUCOSEU NEGATIVE 08/29/2016 0000  HGBUR NEGATIVE 08/29/2016 0000   BILIRUBINUR NEGATIVE 08/29/2016 0000   KETONESUR NEGATIVE 08/29/2016 0000   PROTEINUR NEGATIVE 08/29/2016 0000   NITRITE NEGATIVE 08/29/2016 0000   LEUKOCYTESUR MODERATE (A) 08/29/2016 0000   Sepsis Labs Invalid input(s): PROCALCITONIN,  WBC,  LACTICIDVEN Microbiology Recent Results (from the past 240 hour(s))  MRSA PCR Screening     Status: None   Collection Time: 08/28/16  8:50 PM  Result Value Ref Range Status   MRSA by PCR NEGATIVE NEGATIVE Final    Comment:        The GeneXpert MRSA Assay (FDA approved for NASAL specimens only), is one component of a comprehensive MRSA colonization surveillance program. It is not intended to diagnose MRSA infection nor to guide or monitor  treatment for MRSA infections.      Time coordinating discharge: 40 minutes  SIGNED:  Dessa Phi, DO [facility] Pager [phone]  If 7PM-7AM, please contact night-coverage [email or web address] Password [email or web address] 09/01/2016, 2:01 PM

## 2016-09-01 NOTE — Procedures (Signed)
Interventional Radiology Procedure Note  Procedure: Placement of a right IJ approach single lumen PowerPort.  Tip is positioned at the superior cavoatrial junction and catheter is ready for immediate use.  Complications: None Recommendations:  - Ok to shower tomorrow - Do not submerge for 7 days - Routine line care   Signed,  Casimira Sutphin S. Noella Kipnis, DO   

## 2016-09-01 NOTE — Consult Note (Signed)
Chief Complaint: Patient was seen in consultation today for port a cath placement Chief Complaint  Patient presents with  . Shortness of Breath   at the request of Dr Shelda Pal  Referring Physician(s): Dr Shelda Pal  Supervising Physician: Corrie Mckusick  Patient Status: [facility] - In-pt  History of Present Illness: Jodi Collins is a 66 y.o. female   New dx gastric adenocarcinoma Has been seen and evaluated with Oncology Need for [facility] placement asap to start therapy per Dr Jana Hakim  Hx LLE DVT---intolerant to anticoagulation + PE and IVC filter placed in IR 08/29/16  Past Medical History:  Diagnosis Date  . DVT (deep venous thrombosis) ([place])     Past Surgical History:  Procedure Laterality Date  . CERVICAL CONIZATION W/BX  2012   high grade squamous intraepithelial dysplasia.  Dr Garwin Brothers.   . COLONOSCOPY W/ POLYPECTOMY  10/2006 and 10/2011   Dr Collene Mares.  had adenomatous polyps.    . ESOPHAGOGASTRODUODENOSCOPY N/A 08/30/2016   Procedure: ESOPHAGOGASTRODUODENOSCOPY (EGD);  Surgeon: Carol Ada, MD;  Location: [place];  Service: Gastroenterology;  Laterality: N/A;  . IR IVC FILTER PLMT / S&I /IMG GUID/MOD SED  08/29/2016    Allergies: Patient has no known allergies.  Medications: Prior to Admission medications   Medication Sig Start Date End Date Taking? Authorizing Provider  Calcium Citrate-Vitamin D (CALCIUM + D PO) Take 1 tablet by mouth daily.   Yes [provider]  ibuprofen (ADVIL,MOTRIN) 200 MG tablet Take 200 mg by mouth every 6 (six) hours as needed for mild pain or moderate pain.   Yes [provider]  Multiple Vitamin (MULTIVITAMIN WITH MINERALS) TABS tablet Take 1 tablet by mouth daily.   Yes [provider]     History reviewed. No pertinent family history.  Social History   Social History  . Marital status: Legally Separated    Spouse name: N/A  . Number of children: N/A  . Years of education: N/A   Social  History Main Topics  . Smoking status: Never Smoker  . Smokeless tobacco: Never Used  . Alcohol use Yes     Comment: rarely  . Drug use: No  . Sexual activity: Not Asked   Other Topics Concern  . None   Social History Narrative  . None    Review of Systems: A 12 point ROS discussed and pertinent positives are indicated in the HPI above.  All other systems are negative.  Review of Systems  Constitutional: Positive for appetite change and fatigue. Negative for activity change and fever.  Respiratory: Negative for cough and shortness of breath.   Cardiovascular: Negative for chest pain.  Gastrointestinal: Negative for abdominal pain.  Musculoskeletal: Negative for back pain.  Neurological: Negative for weakness.  Psychiatric/Behavioral: Negative for behavioral problems and confusion.    Vital Signs: BP (!) 98/53 (BP Location: Right Leg)   Pulse 87   Temp 98.7 F (37.1 C)   Resp 16   Ht 5\' 2"  (1.575 m)   Wt 128 lb 6.4 oz (58.2 kg)   SpO2 98%   BMI 23.48 kg/m   Physical Exam  Constitutional: She is oriented to person, place, and time.  Cardiovascular: Normal rate and regular rhythm.   Pulmonary/Chest: Effort normal and breath sounds normal.  Abdominal: Soft. Bowel sounds are normal. There is no tenderness.  Musculoskeletal: Normal range of motion.  Neurological: She is alert and oriented to person, place, and time.  Skin: Skin is warm and dry.  Psychiatric: She has a normal mood and affect. Her behavior is normal. Judgment and thought content normal.  Nursing note and vitals reviewed.   Mallampati Score:  MD Evaluation Airway: WNL Heart: WNL Abdomen: WNL Chest/ Lungs: WNL ASA  Classification: 3 Mallampati/Airway Score: One  Imaging: Ct Angio Chest W/cm &/or Wo Cm  Result Date: 08/28/2016 CLINICAL DATA:  66 year old female with a history of shortness of breath EXAM: CT ANGIOGRAPHY CHEST CT ABDOMEN AND PELVIS WITH CONTRAST TECHNIQUE: Multidetector CT imaging  of the chest was performed using the standard protocol during bolus administration of intravenous contrast. Multiplanar CT image reconstructions and MIPs were obtained to evaluate the vascular anatomy. Multidetector CT imaging of the abdomen and pelvis was performed using the standard protocol during bolus administration of intravenous contrast. CONTRAST:  100 cc Isovue 370 COMPARISON:  None. FINDINGS: CTA CHEST FINDINGS Cardiovascular: Heart: No cardiomegaly. No pericardial fluid/thickening. No significant coronary calcifications. Right and left ventricles are somewhat compressed by the large left-sided diaphragmatic herniation. Estimated diameter of the left ventricle measures 20 mm with estimated diameter of the right ventricle measuring 43 mm. Aorta: Unremarkable course, caliber, contour of the thoracic aorta. No aneurysm or dissection flap. No periaortic fluid. Pulmonary arteries: Filling defects of the right-sided pulmonary arteries involving the segmental branches of the right lower lobe, right middle lobe, right upper lobe. No filling defects within left-sided pulmonary arteries identified. Mediastinum/Nodes: Mediastinal structures somewhat compressed by the diaphragmatic hernia. Lymph nodes are present within the mediastinum, with the lowest peritracheal lymph nodes borderline enlarged. Lungs/Pleura: Right lung with no pleural effusion or confluent airspace disease. Atelectatic changes at the base of the left lung.  No pneumothorax. Small nodule within the right upper lobe (image 30 of series 6). Musculoskeletal: No displaced fracture. Degenerative changes of the spine. Review of the MIP images confirms the above findings. CT ABDOMEN and PELVIS FINDINGS Hepatobiliary: Rounded hypodense lesion of the inferior right liver measures 7 mm (image 22 of series 12) and 10 mm (image 26 of series 12). Unremarkable appearance the gallbladder. Pancreas: Unremarkable appearance of the pancreas. Spleen: Unremarkable  appearance of spleen Adrenals/Urinary Tract: Unremarkable appearance the bilateral adrenal glands. No evidence of left-sided or right-sided hydronephrosis. No nephrolithiasis. No perinephric stranding. On the right there is an ill-defined hypodense/ hypoenhancing region at the posterior/ lateral cortex of the kidney measuring 10 mm. Stomach/Bowel: Herniation of the majority of the stomach into the left chest through diaphragmatic hernia. The splenic flexure is herniated into the left chest. No evidence of abnormally distended small bowel or colon. Vascular/Lymphatic: Extensive nodularity of the mesenteric fat associated with the stomach, splenic flexure, and the mesenteric fat herniated into the left chest. Nodular tissue associated with the vasculature. There is circumferential soft tissue surrounding the left gastric artery after the origin from the celiac artery soft tissue essentially in cases the gastric artery just after the origin. Hazy infiltration of the mesenteric fat within the left upper quadrant. Enhancing peritoneal surfaces in the low abdomen/pelvis, involving nearly all the visualized at adnexa and the mesenteric fat associated with small bowel. Small volume ascites in the abdomen/pelvis. Unremarkable course caliber and contour of the abdominal aorta. No significant atherosclerotic changes. Proximal femoral vasculature patent. Nonocclusive venous thrombus within the left iliac system at the confluence of internal iliac vein in the external iliac vein. Proximal femoral veins unremarkable. Reproductive: Enhancing soft tissue nodularity associated with the right and left adnexa, with the surfaces of the broad ligaments in case with enhancing soft tissue. Other: Enhancing  soft tissue within the umbilical region, likely pathologic lymph node. Musculoskeletal: No displaced fracture. No significant degenerative changes of the hips. Review of the MIP images confirms the above findings. IMPRESSION: The study  is positive for right-sided pulmonary emboli involving segmental and subsegmental branches. Assessing for heart strain is limited given the distortion secondary to left-sided diaphragmatic hernia. If there is concern for right-sided heart strain, correlation with cardiac ECHO may be useful. Left-sided diaphragmatic hernia, involving majority of the stomach, splenic flexure, and large portion of the associated mesentery. 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 Alternative site of a presumed malignancy of the left upper quadrant could be the splenic flexure. Correlation with endoscopy and possibly colonoscopy recommended. 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 Enhancing lesion within the umbilical region, likely metastatic involvement of lymph node. Small hypodense lesions of the right liver, too small to characterize and possibly benign cysts. Correlation with any future PET-CT may be useful. Low-density lesion of the lateral cortex of the right kidney. Differential diagnosis includes kidney infarction, infection, or tumor implant. Small nodule of the right upper lobe measuring 4 mm. Correlation with future imaging recommended. Nonocclusive left iliac DVT. These results were called by telephone at the time of interpretation on 08/28/2016 at 5:52 pm to Dr. Merrily Pew , who verbally acknowledged these results. Signed, Dulcy Fanny. Earleen Newport, DO [facility] Radiology Electronically Signed   By: Corrie Mckusick D.O.   On: 08/28/2016 17:57   Ct Abdomen Pelvis W Contrast  Result Date: 08/28/2016 CLINICAL DATA:  66 year old female with a history  of shortness of breath EXAM: CT ANGIOGRAPHY CHEST CT ABDOMEN AND PELVIS WITH CONTRAST TECHNIQUE: Multidetector CT imaging of the chest was performed using the standard protocol during bolus administration of intravenous contrast. Multiplanar CT image reconstructions and MIPs were obtained to evaluate the vascular anatomy. Multidetector CT imaging of the abdomen and pelvis was performed using the standard protocol during bolus administration of intravenous contrast. CONTRAST:  100 cc Isovue 370 COMPARISON:  None. FINDINGS: CTA CHEST FINDINGS Cardiovascular: Heart: No cardiomegaly. No pericardial fluid/thickening. No significant coronary calcifications. Right and left ventricles are somewhat compressed by the large left-sided diaphragmatic herniation. Estimated diameter of the left ventricle measures 20 mm with estimated diameter of the right ventricle measuring 43 mm. Aorta: Unremarkable course, caliber, contour of the thoracic aorta. No aneurysm or dissection flap. No periaortic fluid. Pulmonary arteries: Filling defects of the right-sided pulmonary arteries involving the segmental branches of the right lower lobe, right middle lobe, right upper lobe. No filling defects within left-sided pulmonary arteries identified. Mediastinum/Nodes: Mediastinal structures somewhat compressed by the diaphragmatic hernia. Lymph nodes are present within the mediastinum, with the lowest peritracheal lymph nodes borderline enlarged. Lungs/Pleura: Right lung with no pleural effusion or confluent airspace disease. Atelectatic changes at the base of the left lung.  No pneumothorax. Small nodule within the right upper lobe (image 30 of series 6). Musculoskeletal: No displaced fracture. Degenerative changes of the spine. Review of the MIP images confirms the above findings. CT ABDOMEN and PELVIS FINDINGS Hepatobiliary: Rounded hypodense lesion of the inferior right liver measures 7 mm (image 22 of series 12) and 10 mm (image 26 of series  12). Unremarkable appearance the  gallbladder. Pancreas: Unremarkable appearance of the pancreas. Spleen: Unremarkable appearance of spleen Adrenals/Urinary Tract: Unremarkable appearance the bilateral adrenal glands. No evidence of left-sided or right-sided hydronephrosis. No nephrolithiasis. No perinephric stranding. On the right there is an ill-defined hypodense/ hypoenhancing region at the posterior/ lateral cortex of the kidney measuring 10 mm. Stomach/Bowel: Herniation of the majority of the stomach into the left chest through diaphragmatic hernia. The splenic flexure is herniated into the left chest. No evidence of abnormally distended small bowel or colon. Vascular/Lymphatic: Extensive nodularity of the mesenteric fat associated with the stomach, splenic flexure, and the mesenteric fat herniated into the left chest. Nodular tissue associated with the vasculature. There is circumferential soft tissue surrounding the left gastric artery after the origin from the celiac artery soft tissue essentially in cases the gastric artery just after the origin. Hazy infiltration of the mesenteric fat within the left upper quadrant. Enhancing peritoneal surfaces in the low abdomen/pelvis, involving nearly all the visualized at adnexa and the mesenteric fat associated with small bowel. Small volume ascites in the abdomen/pelvis. Unremarkable course caliber and contour of the abdominal aorta. No significant atherosclerotic changes. Proximal femoral vasculature patent. Nonocclusive venous thrombus within the left iliac system at the confluence of internal iliac vein in the external iliac vein. Proximal femoral veins unremarkable. Reproductive: Enhancing soft tissue nodularity associated with the right and left adnexa, with the surfaces of the broad ligaments in case with enhancing soft tissue. Other: Enhancing soft tissue within the umbilical region, likely pathologic lymph node. Musculoskeletal: No displaced fracture. No  significant degenerative changes of the hips. Review of the MIP images confirms the above findings. IMPRESSION: The study is positive for right-sided pulmonary emboli involving segmental and subsegmental branches. Assessing for heart strain is limited given the distortion secondary to left-sided diaphragmatic hernia. If there is concern for right-sided heart strain, correlation with cardiac ECHO may be useful. Left-sided diaphragmatic hernia, involving majority of the stomach, splenic flexure, and large portion of the associated mesentery. 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 Alternative site of a presumed malignancy of the left upper quadrant could be the splenic flexure. Correlation with endoscopy and possibly colonoscopy recommended. 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 Enhancing lesion within the umbilical region, likely metastatic involvement of lymph node. Small hypodense lesions of the right liver, too small to characterize and possibly benign cysts. Correlation with any future PET-CT may be useful. Low-density lesion of the lateral cortex of the right kidney. Differential diagnosis includes kidney infarction, infection, or tumor implant. Small nodule of the right upper lobe measuring 4 mm. Correlation with future imaging recommended. Nonocclusive left iliac DVT. These results were called by telephone at the time of interpretation on 08/28/2016 at 5:52 pm to Dr. Merrily Pew , who verbally acknowledged these results. Signed, Dulcy Fanny. Earleen Newport, DO [facility] Radiology Electronically Signed   By: Corrie Mckusick D.O.   On:  08/28/2016 17:57   Ir Ivc Filter Plmt / S&i /img Guid/mod Sed  Result Date: 08/29/2016 INDICATION: 66 year old female with pulmonary embolism and left iliac DVT. Risk factor of new malignancy. Anti coagulation contraindicated secondary to presumed gastrointestinal hemorrhage EXAM: ULTRASOUND GUIDED ACCESS RIGHT INTERNAL JUGULAR VEIN. PLACEMENT OF RETRIEVABLE IVC FILTER  VIA IJ APPROACH MEDICATIONS: None. ANESTHESIA/SEDATION: 1.5 mg IV Versed; 75 mcg IV Fentanyl Moderate Sedation Time:  10 minutes The patient was continuously monitored during the procedure by the interventional radiology nurse under my direct supervision. FLUOROSCOPY TIME:  Fluoroscopy Time: 0 minutes 36 seconds (37 mGy). COMPLICATIONS: None PROCEDURE: The procedure, risks, benefits, and alternatives were explained to the patient. Specific risks discussed include bleeding, infection, contrast reaction, renal failure, IVC filter fracture, migration, ileo caval thrombus (3% incidence), need for further procedure, need for further surgery, pulmonary embolism, cardiopulmonary collapse, death. Questions regarding the procedure were encouraged and answered. The patient understands and consents to the procedure. Ultrasound survey was performed with images stored and sent to PACs. The neck was prepped with Betadine in a sterile fashion, and a sterile drape was applied covering the operative field. A sterile gown and sterile gloves were used for the procedure. Local anesthesia was provided with 1% Lidocaine. A micropuncture needle was used access the right internal jugular vein under ultrasound. With excellent venous blood flow returned, and an .018 micro wire was passed through the needle. Small incision was made with an 11 blade scalpel. The needle was removed, and a micropuncture sheath was placed over the wire. The inner dilator and wire were removed, and an 035 Bentson wire was advanced under fluoroscopy into the IVC. Serial dilation of the soft  tissue tract was performed with an 8 [place] dilator and subsequently a 10 [place] dilator. The delivery sheath for a retrievable Bard Denali filter was passed over the Bentson wire into the IVC. The wire was removed and small contrast was used to confirm IVC location. IVC cavagram performed. Dilator was removed, and the IVC filter was then delivered, positioned below the lowest renal vein. Repeat cavagram performed, and the catheter was removed. Manual pressure was used for hemostasis. Patient tolerated the procedure well and remained hemodynamically stable throughout. No complications were encountered and no significant blood loss was encounter. IMPRESSION: Status post retrievable IVC filter placement via IJ approach. Signed, Dulcy Fanny. Earleen Newport DO [facility] Radiology PLAN: This IVC filter is potentially retrievable. The patient may be assessed for filter retrieval by Interventional Radiology in approximately 8-12 weeks. Further recommendations regarding filter retrieval, continued surveillance or declaration of device permanence, can be made at that time. Electronically Signed   By: Corrie Mckusick D.O.   On: 08/29/2016 12:18   US Venous Img Lower Bilateral  Result Date: 08/17/2016 CLINICAL DATA:  Left plantar fasciitis.  Right leg swelling. EXAM: BILATERAL LOWER EXTREMITY VENOUS DOPPLER ULTRASOUND TECHNIQUE: Gray-scale sonography with graded compression, as well as color Doppler and duplex ultrasound were performed to evaluate the lower extremity deep venous systems from the level of the common femoral vein and including the common femoral, femoral, profunda femoral, popliteal and calf veins including the posterior tibial, peroneal and gastrocnemius veins when visible. The superficial great saphenous vein was also interrogated. Spectral Doppler was utilized to evaluate flow at rest and with distal augmentation maneuvers in the common femoral, femoral and popliteal  veins. COMPARISON:  No prior. FINDINGS: RIGHT LOWER EXTREMITY Common Femoral Vein: No evidence of thrombus. Normal compressibility, respiratory phasicity and response to augmentation. Saphenofemoral Junction: No evidence of thrombus. Normal compressibility and flow on color Doppler imaging. Profunda Femoral Vein: No evidence of thrombus. Normal compressibility and flow on color Doppler imaging. Femoral Vein: No evidence of thrombus. Normal compressibility, respiratory phasicity and response to augmentation. Popliteal Vein: No evidence of thrombus. Normal compressibility, respiratory phasicity  and response to augmentation. Calf Veins: No evidence of thrombus. Normal compressibility and flow on color Doppler imaging. Superficial Great Saphenous Vein: No evidence of thrombus. Normal compressibility and flow on color Doppler imaging. Other Findings:  None. LEFT LOWER EXTREMITY Common Femoral Vein: No evidence of thrombus. Normal compressibility, respiratory phasicity and response to augmentation. Saphenofemoral Junction: No evidence of thrombus. Normal compressibility and flow on color Doppler imaging. Profunda Femoral Vein: No evidence of thrombus. Normal compressibility and flow on color Doppler imaging. Femoral Vein: No evidence of thrombus. Normal compressibility, respiratory phasicity and response to augmentation. Popliteal Vein: Nonocclusive thrombus in the left popliteal vein. Calf Veins: Nonocclusive thrombus in the lead posterior tibial vein. Superficial Great Saphenous Vein: No evidence of thrombus. Normal compressibility and flow on color Doppler imaging. Other Findings:  None. IMPRESSION: Positive study for DVT left popliteal vein and left posterior tibial vein. Nonocclusive thrombus present. Right lower extremity unremarkable. Electronically Signed   By: Marcello Moores  Register   On: 08/17/2016 10:30    Labs:  CBC:  Recent Labs  08/29/16 1143 08/29/16 1825 08/30/16 0001 09/01/16 0410  WBC 14.0* 14.0*  13.1* 8.7  HGB 10.1* 10.4* 10.0* 9.5*  HCT 31.8* 32.7* 31.1* 30.6*  PLT 89* 88* 82* 107*    COAGS:  Recent Labs  08/28/16 1547 08/28/16 1642 08/30/16 0001  INR 1.50  --  1.37  APTT  --  28 31    BMP:  Recent Labs  08/28/16 1547 08/29/16 1143 08/30/16 0001 09/01/16 0410  NA 138 139 137 136  K 4.4 4.3 4.1 4.1  CL 106 112* 111 105  CO2 16* 20* 19* 22  GLUCOSE 148* 113* 105* 106*  BUN 21* 19 18 14   CALCIUM 8.3* 7.9* 8.1* 8.0*  CREATININE 1.14* 0.99 0.99 0.86  GFRNONAA 50* 59* 59* >60  GFRAA 58* >60 >60 >60    LIVER FUNCTION TESTS:  Recent Labs  08/28/16 1547 08/29/16 1143  BILITOT 0.6 1.5*  AST 54* 41  ALT 32 26  ALKPHOS 77 72  PROT 7.1 6.3*  ALBUMIN 2.6* 2.3*    TUMOR MARKERS: No results for input(s): AFPTM, CEA, CA199, CHROMGRNA in the last 8760 hours.  Assessment and Plan:  New dx gastric adenocarcinoma LLE DVT and PE- but could not tolerate anticoagulation IVC filter placed 08/29/16 in IR [facility] for [facility] a Cath placement per Oncology Risks and Benefits discussed with the patient including, but not limited to bleeding, infection, pneumothorax, or fibrin sheath development and need for additional procedures. All of the patient's questions were answered, patient is agreeable to proceed. Consent signed and in chart.  Thank you for this interesting consult.  I greatly enjoyed meeting Tenille Clarke-Campbell and look forward to participating in their care.  A copy of this report was sent to the requesting provider on this date.  Electronically Signed: Lavonia Drafts, PA-C 09/01/2016, 8:05 AM   I spent a total of 20 Minutes    in face to face in clinical consultation, greater than 50% of which was counseling/coordinating care for [facility] placement

## 2016-09-06 ENCOUNTER — Encounter (HOSPITAL_COMMUNITY): Payer: 59 | Attending: Oncology | Admitting: Oncology

## 2016-09-06 ENCOUNTER — Encounter (HOSPITAL_COMMUNITY): Payer: Self-pay

## 2016-09-06 ENCOUNTER — Encounter (HOSPITAL_COMMUNITY): Payer: 59

## 2016-09-06 DIAGNOSIS — K449 Diaphragmatic hernia without obstruction or gangrene: Secondary | ICD-10-CM | POA: Diagnosis not present

## 2016-09-06 DIAGNOSIS — Z7901 Long term (current) use of anticoagulants: Secondary | ICD-10-CM | POA: Diagnosis not present

## 2016-09-06 DIAGNOSIS — D649 Anemia, unspecified: Secondary | ICD-10-CM | POA: Insufficient documentation

## 2016-09-06 DIAGNOSIS — C160 Malignant neoplasm of cardia: Secondary | ICD-10-CM

## 2016-09-06 DIAGNOSIS — C169 Malignant neoplasm of stomach, unspecified: Secondary | ICD-10-CM | POA: Insufficient documentation

## 2016-09-06 DIAGNOSIS — I82402 Acute embolism and thrombosis of unspecified deep veins of left lower extremity: Secondary | ICD-10-CM | POA: Diagnosis not present

## 2016-09-06 DIAGNOSIS — C168 Malignant neoplasm of overlapping sites of stomach: Secondary | ICD-10-CM

## 2016-09-06 LAB — COMPREHENSIVE METABOLIC PANEL
ALT: 24 U/L (ref 14–54)
ANION GAP: 16 — AB (ref 5–15)
AST: 37 U/L (ref 15–41)
Albumin: 2.4 g/dL — ABNORMAL LOW (ref 3.5–5.0)
Alkaline Phosphatase: 78 U/L (ref 38–126)
BUN: 70 mg/dL — ABNORMAL HIGH (ref 6–20)
CHLORIDE: 97 mmol/L — AB (ref 101–111)
CO2: 14 mmol/L — AB (ref 22–32)
CREATININE: 4.16 mg/dL — AB (ref 0.44–1.00)
Calcium: 8.9 mg/dL (ref 8.9–10.3)
GFR calc non Af Amer: 10 mL/min — ABNORMAL LOW (ref >60)
GFR, EST AFRICAN AMERICAN: 12 mL/min — AB (ref >60)
Glucose, Bld: 130 mg/dL — ABNORMAL HIGH (ref 65–99)
POTASSIUM: 5.2 mmol/L — AB (ref 3.5–5.1)
SODIUM: 127 mmol/L — AB (ref 135–145)
Total Bilirubin: 0.3 mg/dL (ref 0.3–1.2)
Total Protein: 7.1 g/dL (ref 6.5–8.1)

## 2016-09-06 LAB — SAMPLE TO BLOOD BANK

## 2016-09-06 LAB — CBC WITH DIFFERENTIAL/PLATELET
BASOS PCT: 0 %
Basophils Absolute: 0 10*3/uL (ref 0.0–0.1)
EOS ABS: 0 10*3/uL (ref 0.0–0.7)
Eosinophils Relative: 0 %
HCT: 22.7 % — ABNORMAL LOW (ref 36.0–46.0)
Hemoglobin: 7.2 g/dL — ABNORMAL LOW (ref 12.0–15.0)
Lymphocytes Relative: 8 %
Lymphs Abs: 1.4 10*3/uL (ref 0.7–4.0)
MCH: 25.4 pg — AB (ref 26.0–34.0)
MCHC: 31.7 g/dL (ref 30.0–36.0)
MCV: 80.2 fL (ref 78.0–100.0)
MONO ABS: 0.5 10*3/uL (ref 0.1–1.0)
Monocytes Relative: 3 %
NEUTROS PCT: 89 %
Neutro Abs: 15.4 10*3/uL — ABNORMAL HIGH (ref 1.7–7.7)
PLATELETS: 167 10*3/uL (ref 150–400)
RBC: 2.83 MIL/uL — ABNORMAL LOW (ref 3.87–5.11)
RDW: 18.5 % — AB (ref 11.5–15.5)
WBC: 17.3 10*3/uL — ABNORMAL HIGH (ref 4.0–10.5)

## 2016-09-06 LAB — IRON AND TIBC
Iron: 28 ug/dL (ref 28–170)
SATURATION RATIOS: 11 % (ref 10.4–31.8)
TIBC: 256 ug/dL (ref 250–450)
UIBC: 228 ug/dL

## 2016-09-06 LAB — PREPARE RBC (CROSSMATCH)

## 2016-09-06 LAB — FERRITIN: FERRITIN: 127 ng/mL (ref 11–307)

## 2016-09-06 NOTE — Progress Notes (Signed)
[facility] Initial Visit:  Patient Care Team: Glendale Chard, MD as PCP - General (Internal Medicine) Carol Ada, MD as Consulting Physician (Gastroenterology) Ladell Pier, MD as Consulting Physician (Oncology) Magrinat, Virgie Dad, MD as Consulting Physician (Oncology)  CHIEF COMPLAINTS/PURPOSE OF CONSULTATION:  Stage IV gastric cancer   HISTORY OF PRESENTING ILLNESS: Jodi Collins 66 y.o. female is here for evaluation of recently diagnosed stage IV gastric cancer. Patient initially noted left leg swelling after a plane trip a few weeks ago so she went to see her PCP.   08/17/2016 Doppler ultrasonography of both lower extremities: no dvt in the right leg. The left popliteal and posterior tibial veins showed nonocclusive thrombus.  The patient was started on Xarelto, but developed melena and stopped the medication after a few days.   She then presented to the emergency room on 08/28/16 with shortness of breath, cough, and further left lower extremity swelling 08/28/2016.   6/11/8: CT angiogram chest demonstrated right-sided segmental and subsegmental emboli. Left iliac nonocclusive DVT.  08/28/16:CT of the abdomen and pelvis with contrast: Extensive soft tissue associated with the stomach body along the lesser curvature near the GE junction and surrounding the left gastric artery, concerning for primary gastric malignancy. Alternative site of a presumed malignancy of the left upper quadrant could be the splenic flexure. 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   She had a severe anemia with a hemoglobin of 4.4 g/dl, MCV 84.2. She was given pRBC transfusions which got her hemoglobin up to 10 g/dl.   08/29/16: retrievable IVC filter placement by IR.  08/30/2016: EGD  demonstrated 4 cm hiatal hernia. 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 Malignant gastric tumor at the gastroesophageal junction, in the cardia, in the gastric fundus and in the gastric body. Biopsied. Normal examined duodenum. Biopsy path report showing an invasive poorly differentiated carcinoma with focal signet ring features.  09/01/16: Patient discharged from the hospital. Hgb 9.5 g/dl at the time of discharge.  Patient stated that she has not had any malaise, abdominal pain/bloating, weight loss, or dysphagia prior to this. She stated she was completely asymptomatic except for her left leg swelling. She states that since she's been discharged from the hospital, her left leg swelling has been getting worse and she has been having pain because of the swelling.   Review of Systems  Constitutional: Negative for appetite change, chills, fatigue and fever.  HENT:   Negative for hearing loss, lump/mass, mouth sores, sore throat and tinnitus.   Eyes: Negative for eye problems and icterus.  Respiratory: Negative for chest tightness, cough, hemoptysis, shortness of breath and wheezing.   Cardiovascular: Negative for chest pain, leg swelling and palpitations.  Gastrointestinal: Negative for abdominal distention, abdominal pain, blood in stool, diarrhea, nausea and vomiting.  Endocrine: Negative.  Negative for hot flashes.  Genitourinary: Negative for difficulty urinating, frequency and hematuria.   Musculoskeletal: Negative for arthralgias and neck pain.       Left leg swelling and pain  Skin: Negative for itching and rash.  Neurological: Negative for dizziness, headaches and speech difficulty.  Hematological: Negative for adenopathy. Does not bruise/bleed easily.  Psychiatric/Behavioral: Negative for confusion. The patient is not nervous/anxious.     MEDICAL HISTORY: Past Medical History:  Diagnosis Date  . Anemia   . DVT (deep venous thrombosis) (  [place])   .  Stomach cancer ([place])     SURGICAL HISTORY: Past Surgical History:  Procedure Laterality Date  . CERVICAL CONIZATION W/BX  2012   high grade squamous intraepithelial dysplasia.  Dr Garwin Brothers.   . COLONOSCOPY W/ POLYPECTOMY  10/2006 and 10/2011   Dr Collene Mares.  had adenomatous polyps.    . ESOPHAGOGASTRODUODENOSCOPY N/A 08/30/2016   Procedure: ESOPHAGOGASTRODUODENOSCOPY (EGD);  Surgeon: Carol Ada, MD;  Location: [facility];  Service: Gastroenterology;  Laterality: N/A;  . IR FLUORO GUIDE PORT INSERTION RIGHT  09/01/2016  . IR IVC FILTER PLMT / S&I /IMG GUID/MOD SED  08/29/2016  . IR US GUIDE VASC ACCESS RIGHT  09/01/2016    SOCIAL HISTORY: Social History   Social History  . Marital status: Legally Separated    Spouse name: N/A  . Number of children: N/A  . Years of education: N/A   Occupational History  . Not on file.   Social History Main Topics  . Smoking status: Never Smoker  . Smokeless tobacco: Never Used  . Alcohol use Yes     Comment: rarely  . Drug use: No  . Sexual activity: Not Currently   Other Topics Concern  . Not on file   Social History Narrative  . No narrative on file    FAMILY HISTORY Family History  Problem Relation Age of Onset  . Hypertension Mother   . Diabetes Mother    Father had gastric cancer at age of 92.   ALLERGIES:  has No Known Allergies.  MEDICATIONS:  No current outpatient prescriptions on file.   No current facility-administered medications for this visit.     PHYSICAL EXAMINATION:  ECOG PERFORMANCE STATUS: 2 - Symptomatic, <50% confined to bed   Vitals:   09/06/16 1310  BP: 91/66  Pulse: (!) 105  Resp: 18  Temp: 97.6 F (36.4 C)    Filed Weights   09/06/16 1310  Weight: 130 lb 3.2 oz (59.1 kg)     Physical Exam  Constitutional: She is oriented to person, place, and time. No distress.  Thin female in NAD. Patient in wheelchair due to difficulty in ambulation from her left leg DVT.   HENT:  Head: Normocephalic  and atraumatic.  Mouth/Throat: No oropharyngeal exudate.  Eyes: Conjunctivae are normal. Pupils are equal, round, and reactive to light. No scleral icterus.  Neck: Normal range of motion. Neck supple. No JVD present.  Cardiovascular: Normal rate, regular rhythm and normal heart sounds.  Exam reveals no gallop and no friction rub.   No murmur heard. Pulmonary/Chest: Breath sounds normal. No respiratory distress. She has no wheezes. She has no rales.  Abdominal: Soft. Bowel sounds are normal. She exhibits no distension. There is no tenderness. There is no guarding.  Musculoskeletal: She exhibits no edema or tenderness.  Lymphadenopathy:    She has no cervical adenopathy.  Neurological: She is alert and oriented to person, place, and time. No cranial nerve deficit.  Skin: Skin is warm and dry. No rash noted. No erythema. No pallor.  Psychiatric: Affect and judgment normal.     LABORATORY DATA: I have personally reviewed the data as listed:  Office Visit on 09/06/2016  Component Date Value Ref Range Status  . WBC 09/06/2016 17.3* 4.0 - 10.5 K/uL Final  . RBC 09/06/2016 2.83* 3.87 - 5.11 MIL/uL Final  . Hemoglobin 09/06/2016 7.2* 12.0 - 15.0 g/dL Final  . HCT 09/06/2016 22.7* 36.0 - 46.0 % Final  . MCV 09/06/2016 80.2  78.0 -  100.0 fL Final  . MCH 09/06/2016 25.4* 26.0 - 34.0 pg Final  . MCHC 09/06/2016 31.7  30.0 - 36.0 g/dL Final  . RDW 09/06/2016 18.5* 11.5 - 15.5 % Final  . Platelets 09/06/2016 167  150 - 400 K/uL Final   Comment: PLATELET COUNT CONFIRMED BY SMEAR GIANT PLATELETS SEEN SPECIMEN CHECKED FOR CLOTS   . Neutrophils Relative % 09/06/2016 89  % Final  . Lymphocytes Relative 09/06/2016 8  % Final  . Monocytes Relative 09/06/2016 3  % Final  . Eosinophils Relative 09/06/2016 0  % Final  . Basophils Relative 09/06/2016 0  % Final  . Neutro Abs 09/06/2016 15.4* 1.7 - 7.7 K/uL Final  . Lymphs Abs 09/06/2016 1.4  0.7 - 4.0 K/uL Final  . Monocytes Absolute 09/06/2016 0.5   0.1 - 1.0 K/uL Final  . Eosinophils Absolute 09/06/2016 0.0  0.0 - 0.7 K/uL Final  . Basophils Absolute 09/06/2016 0.0  0.0 - 0.1 K/uL Final  . RBC Morphology 09/06/2016 TARGET CELLS   Final   Comment: BURR CELLS SCHISTOCYTES NOTED ON SMEAR ANISOCYTES PLATELET COUNT CONFIRMED BY SMEAR GIANT PLATELETS SEEN SPECIMEN CHECKED FOR CLOTS   . Sodium 09/06/2016 127* 135 - 145 mmol/L Final  . Potassium 09/06/2016 5.2* 3.5 - 5.1 mmol/L Final  . Chloride 09/06/2016 97* 101 - 111 mmol/L Final  . CO2 09/06/2016 14* 22 - 32 mmol/L Final  . Glucose, Bld 09/06/2016 130* 65 - 99 mg/dL Final  . BUN 09/06/2016 70* 6 - 20 mg/dL Final  . Creatinine, Ser 09/06/2016 4.16* 0.44 - 1.00 mg/dL Final  . Calcium 09/06/2016 8.9  8.9 - 10.3 mg/dL Final  . Total Protein 09/06/2016 7.1  6.5 - 8.1 g/dL Final  . Albumin 09/06/2016 2.4* 3.5 - 5.0 g/dL Final  . AST 09/06/2016 37  15 - 41 U/L Final  . ALT 09/06/2016 24  14 - 54 U/L Final  . Alkaline Phosphatase 09/06/2016 78  38 - 126 U/L Final  . Total Bilirubin 09/06/2016 0.3  0.3 - 1.2 mg/dL Final  . GFR calc non Af Amer 09/06/2016 10* >60 mL/min Final  . GFR calc Af Amer 09/06/2016 12* >60 mL/min Final   Comment: (NOTE) The eGFR has been calculated using the CKD EPI equation. This calculation has not been validated in all clinical situations. eGFR's persistently <60 mL/min signify possible Chronic Kidney Disease.   . Anion gap 09/06/2016 16* 5 - 15 Final  . Blood Bank Specimen 09/06/2016 SAMPLE AVAILABLE FOR TESTING   Final  . Sample Expiration 09/06/2016 09/07/2016   Final  Admission on 08/28/2016, Discharged on 09/01/2016  No results displayed because visit has over 200 results.      RADIOGRAPHIC STUDIES: I have personally reviewed the radiological images as listed and agree with the findings in the report  No results found.  ASSESSMENT/PLAN 1. 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 -I have discuss chemotherapy or palliative FOLFOX every 2 weeks with the patient in detail and have reviewed side effects as well as scheduling of her chemotherapy. I will tentatively plan to start her on 09/11/16. She already has a right chest wall chemotherapy port that was placed while she was inpatient. -She will get formal chemotherapy teaching by her nurse navigator as well. Antiemetics have been sent to her  pharmacy. - Contact pathology to send out HER-2 testing on her tissue specimen. If she is HER-2 positive, I will add Herceptin to her chemotherapy plan.  2. Severe anemia likely due to bleeding from gastric cancer -Her hemoglobin has dropped down to 7.2 g/dL today. I will set her up for a stat 2 units PRBC transfusion tomorrow. -I have checked her iron studies. She may benefit from IV iron infusion given her ongoing GI bleed.  3. PE/left leg DVT likely due to hypercoagulable state in the setting of malignancy. -Status post retrievable IVC filter placement. -It is unsafe at this time to place her on anticoagulation given her ongoing GI bleed. Hopefully once she starts chemotherapy and the bleeding stops and her hemoglobin stabilizes, then we can start her on anticoagulation with Xarelto.   Orders Placed This Encounter  Procedures  . CBC with Differential    Standing Status:   Future    Number of Occurrences:   1    Standing Expiration Date:   09/06/2017  . Comprehensive metabolic panel    Standing Status:   Future    Number of Occurrences:   1    Standing Expiration Date:   09/06/2017  . Sample to Blood Bank    Standing Status:   Future    Number of Occurrences:   1    Standing Expiration Date:   09/06/2017   Return to clinic on 09/25/16 for continued follow-up and to assess tolerability of the chemotherapy.  All questions were  answered. The patient knows to call the clinic with any problems, questions or concerns.  This note was electronically signed.    Twana First, MD  09/06/2016 3:33 PM

## 2016-09-06 NOTE — Progress Notes (Signed)
pts hemoglobin is 7.2.  Dr Talbert Cage notified.  2 units of PRBCs orders.  Hiliary in lab notified that we would be transfusing the pt on Friday.  Type and screen released, and prepare released.  Pt notified of her lab work and that she will need to be here at 1030 on Friday in Short stay for her blood transfusion.  Pt verbalized understanding.

## 2016-09-07 ENCOUNTER — Encounter (HOSPITAL_COMMUNITY): Payer: Self-pay | Admitting: Emergency Medicine

## 2016-09-07 ENCOUNTER — Telehealth (HOSPITAL_COMMUNITY): Payer: Self-pay | Admitting: Emergency Medicine

## 2016-09-07 ENCOUNTER — Other Ambulatory Visit (HOSPITAL_COMMUNITY): Payer: Self-pay | Admitting: Oncology

## 2016-09-07 MED ORDER — LIDOCAINE-PRILOCAINE 2.5-2.5 % EX CREA: TOPICAL_CREAM | CUTANEOUS | 3 refills | Status: DC

## 2016-09-07 MED ORDER — PROCHLORPERAZINE MALEATE 10 MG PO TABS: 10.0000 mg | ORAL_TABLET | Freq: Four times a day (QID) | ORAL | 1 refills | Status: DC | PRN

## 2016-09-07 MED ORDER — OXYCODONE-ACETAMINOPHEN 5-325 MG PO TABS: 1.0000 | ORAL_TABLET | ORAL | 0 refills | Status: AC | PRN

## 2016-09-07 MED ORDER — ONDANSETRON HCL 8 MG PO TABS: 8.0000 mg | ORAL_TABLET | Freq: Two times a day (BID) | ORAL | 1 refills | Status: DC | PRN

## 2016-09-07 MED ORDER — DEXAMETHASONE 4 MG PO TABS: 8.0000 mg | ORAL_TABLET | Freq: Every day | ORAL | 1 refills | Status: DC

## 2016-09-07 NOTE — Progress Notes (Signed)
Chemotherapy teaching pulled together.  appts made. 

## 2016-09-07 NOTE — Telephone Encounter (Signed)
Pt called and stated that she was in excruciating pain when she put any amount of pressure on her left leg.  It just throbs.  Pt lives alone.  Spoke with Dr Talbert Cage.  We wrote her a prescription for pain medication.  Got TED hose for her to apply to her legs.  This should help with some pain.  We can increase the pain medication if we need too.  I called back to check on her and she had just received her medication.   I explained how to use her emla cream for chemotherapy on Monday.

## 2016-09-07 NOTE — Patient Instructions (Signed)
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  We are going to treat you with the regimen FOLFOX.   You will have a pump that you take home home for 48 hours.  This treatment is with palliative intent, which means you are treatable but not curable.  You will see the doctor regularly throughout treatment.  We monitor your lab work prior to every treatment.  The doctor monitors your response to treatment by the way you are feeling, your blood work, and scans periodically.  There will be wait times while you are here for your treatments.  It will take about 30 minutes to 1 hour for your labs to result.  Then there will be wait time for pharmacy to mix your medications.    You will have pre-medications that you receive prior to each chemotherapy: Premeds: Aloxi - high powered nausea/vomiting prevention medication used for chemotherapy patients.  Dexamethasone - steroid - given to reduce the risk of you having an allergic type reaction to the chemotherapy. Dex can cause you to feel energized, nervous/anxious/jittery, make you have trouble sleeping, and/or make you feel hot/flushed in the face/neck and/or look pink/red in the face/neck. These side effects will pass as the Dex wears off. (takes 20 minutes to infuse)    POTENTIAL SIDE EFFECTS OF TREATMENT:  5-Fluorouracil (Adrucil)  About This Drug Fluorouracil is used to treat cancer. It is given in the vein (IV).  Possible Side Effects . Hair loss. Hair loss is often temporary, although with certain medicine, hair loss can sometimes be permanent. Hair loss may happen suddenly or gradually. If you lose hair, you may lose it from your head, face, armpits, pubic area, chest, and/or legs. You may also notice your hair getting thin. . Changes in your nail color, nail loss and/or brittle nail . Darkening of the skin, or changes to the color of your skin and/or  veins used for infusion . Rash, itching . Nausea and throwing up (vomiting) . Loose bowel movements (diarrhea) . Ulcers - sores that may cause pain or bleeding in your digestive tract, which includes your mouth, esophagus, stomach, small/large intestines and rectum . Soreness of the mouth and throat. You may have red areas, white patches, or sores that hurt. . Decreased appetite (decreased hunger) . Changes in the tissue of the heart and/or heart attack. Some changes may happen that can cause your heart to have less ability to pump blood. . Bone marrow depression. This is a decrease in the number of white blood cells, red blood cells, and platelets. This may raise your risk of infection, make you tired and weak (fatigue), and raise your risk of bleeding . Sensitivity to light (photosensitivity). Photosensitivity means that you may become more sensitive to the sun and/or light. Your eyes may water more, mostly in bright light. . Allergic reaction: Allergic reactions, including anaphylaxis are rare but may happen in some patients. Signs of allergic reaction to this drug may be swelling of the face, feeling like your tongue or throat are swelling, trouble breathing, rash, itching, fever, chills, feeling dizzy, and/or feeling that your heart is beating in a fast or not normal way. If this happens, do not take another dose of this drug. You should get urgent medical treatment. . Blurred vision or other changes in eyesight Note: Not all possible side effects are included above.  Warnings and Precautions . Hand-and-foot syndrome. The palms of your  hands or soles of your feet may tingle, become numb, painful, swollen, or red. . Changes in your central nervous system can happen. The central nervous system is made up of your brain and spinal cord. You could feel extreme tiredness, agitation, confusion, hallucinations (see or hear things that are not there), trouble understanding or speaking, loss  of control of your bowels or bladder, eyesight changes, numbness or lack of strength to your arms, legs, face, or body, and coma. If you start to have any of these symptoms let your doctor know right away. . Side effects of this drug may be unexpectedly severe in some patients Note: Some of the side effects above are very rare. If you have concerns and/or questions, please discuss them with your medical team.  Important Information . This drug may be present in the saliva, tears, sweat, urine, stool, vomit, semen, and vaginal secretions. Talk to your doctor and/or your nurse about the necessary precautions to take during this time.  Treating Side Effects . To help with hair loss, wash with a mild shampoo and avoid washing your hair every day. . Avoid rubbing your scalp, pat your hair or scalp dry. . Avoid coloring your hair. . Limit your use of hair spray, electric curlers, blow dryers, and curling irons. . If you are interested in getting a wig, talk to your nurse. You can also call the [place] at [phone] to find out information about the "Look Good, Feel Better" program close to where you live. It is a free program where women getting chemotherapy can learn about wigs, turbans and scarves as well as makeup techniques and skin and nail care. Marland Kitchen Keeping your nails moisturized may help with brittleness. . To help with itching, moisturize your skin several times day. . Avoid sun exposure and apply sunscreen routinely when outdoors. . If you get a rash do not put anything on it unless your doctor or nurse says you may. Keep the area around the rash clean and dry. Ask your doctor for medicine if your rash bothers you. . To help with decreased appetite, eat small, frequent meals. . Eat high caloric food such as pudding, ice cream, yogurt and milkshakes. . Drink plenty of fluids (a minimum of eight glasses per day is recommended). . If you throw up or have loose bowel  movements, you should drink more fluids so that you do not become dehydrated (lack water in the body from losing too much fluid). . To help with nausea and vomiting, eat small, frequent meals instead of three large meals a day. Choose foods and drinks that are at room temperature. Ask your nurse or doctor about other helpful tips and medicine that is available to help or stop lessen these symptoms. . If you get diarrhea, eat low-fiber foods that are high in protein and calories and avoid foods that can irritate your digestive tracts or lead to cramping. . Ask your nurse or doctor about medicine that can lessen or stop your diarrhea. . Mouth care is very important. Your mouth care should consist of routine, gentle cleaning of your teeth or dentures and rinsing your mouth with a mixture of 1/2 teaspoon of salt in 8 ounces of water or  teaspoon of baking soda in 8 ounces of water. This should be done at least after each meal and at bedtime. . If you have mouth sores, avoid mouthwash that has alcohol. Also avoid alcohol and smoking because they can bother your mouth and throat. Marland Kitchen  Manage tiredness by pacing your activities for the day. . Be sure to include periods of rest between energy-draining activities. . To help decrease your risk of infections, wash your hands regularly. . Avoid close contact with people who have a cold, the flu, or other infections. . Use a soft toothbrush. Check with your nurse before using dental floss. . Be very careful when using knives or tools. . Use an electric shaver instead of a razor.  Food and Drug Interactions . There are no known interactions of fluorouracil with food. . Check with your doctor or pharmacist about all other prescription medicines and dietary supplements you are taking before starting this medicine as there are a lot of known drug interactions with fluorouracil. Also, check with your doctor or pharmacist before starting any new prescription  or over-the-counter medicines, or dietary supplement to make sure that there are no interactions.  When to Call the Doctor Call your doctor or nurse if you have any of these symptoms and/or any new or unusual symptoms: . Fever of 100.5 F (38 C) or higher . Chills . Easy bleeding or bruising . Trouble breathing . Feeling dizzy or lightheaded . Feeling that your heart is beating in a fast or not normal way (palpitations) . Chest pain or symptoms of a heart attack. Most heart attacks involve pain in the center of the chest that lasts more than a few minutes. The pain may go away and come back or it can be constant. It can feel like pressure, squeezing, fullness, or pain. Sometimes pain is felt in one or both arms, the back, neck, jaw, or stomach. If any of these symptoms last 2 minutes, call 911. Marland Kitchen Confusion and/or agitation . Hallucinations . Trouble understanding or speaking . Blurry vision or changes in your eyesight . Numbness or lack of strength to your arms, legs, face, or body . Nausea that stops you from eating or drinking and/or is not relieved by prescribed medicines . Throwing up more than 3 times a day . Loose bowel movements (diarrhea) 4 times a day or loose bowel movements with lack of strength or a feeling of being dizzy . Lasting loss of appetite or rapid weight loss of five pounds in a week . Pain in your mouth or throat that makes it hard to eat or drink . Pain along the digestive tract - especially if worse after eating . Blood in your vomit (bright red or coffee-ground) and/or stools (bright red, or black/tarry) . Coughing up blood . Fatigue that interferes with your daily activities . Painful, red, or swollen areas on your hands or feet . Numbness and/or tingling of your hands and/or feet . Signs of allergic reaction: swelling of the face, feeling like your tongue or throat are swelling, trouble breathing, rash, itching, fever, chills, feeling dizzy, and/or feeling  that your heart is beating in a fast or not normal way . If you think you are pregnant or may have impregnated your partner  Reproduction Warnings . Pregnancy warning: This drug may have harmful effects on the unborn baby. Women of child bearing potential should use effective methods of birth control during your cancer treatment. Let your doctor know right away if you think you may be pregnant. . Breastfeeding warning: It is not known if this drug passes into breast milk. For this reason, women should talk to their doctor about the risks and benefits of breast feeding during treatment with this drug because this drug may enter the breast  milk and cause harm to a breast feeding baby. . Fertility warning: In men and women both, this drug may affect your ability to have children in the future. Talk with your doctor or nurse if you plan to have children. Ask for information on sperm or egg banking.   Leucovorin Calcium (Generic Name) Other Names: Leucovorin, Wellcovorin, Folinic Acid, Citrovorum Factor  About This Drug Leucovorin is a vitamin. It is used in combination with other cancer fighting drugs such as 5-fluorouracil and methotrexate. Leucovorin helps 5-fluorouracil kill the cancer cells. It also helps to reduce the side effects of methotrexate. Leucovorin is given in the vein (IV) or by mouth (orally).  Will take 2 hours to infuse.  Possible Side Effects . Rash . Wheezing Leucovorin by itself has very few side effects. Side effects you may have can be caused by the other drugs you are taking, such as 5-fluorouracil or methotrexate.  Allergic Reactions . Allergic reactions to this drug are rare. While you are getting this drug by in your vein (IV), please tell your nurse right away if you have any of these symptoms of an allergic reaction: . Trouble catching your breath . Feeling like your tongue or throat are swelling . Feeling your heart beat quickly or in a not normal way  (palpitations) . Feeling dizzy or lightheaded . Flushing, itching, rash, and/or hives . If you are taking the oral form of leucovorin and you have these symptoms, do not take another dose of this drug and get urgent medical treatment.  Treating Side Effects If you get a rash do not put anything on it unless your doctor or nurse says you may. Keep the area around the rash clean and dry. Ask your doctor for medicine if your rash bothers you.  Food and Drug Interactions There are no known interactions of leucovorin with food. This drug may interact with other medicines. Tell your doctor and pharmacist about all the medicines and dietary supplements (vitamins, minerals, herbs and others) that you are taking at this time. The safety and use of dietary supplements and alternative diets are often not known. Using these might affect your cancer or interfere with your treatment. Until more is known, you should not use dietary supplements or alternative diets without your cancer doctor's help.  When to Call the Doctor . Call your doctor or nurse right away if you have any of these symptoms: . Wheezing or trouble breathing . Rash with or without itching, redness, or hives . If you take leucovorin by mouth, please also call your doctor right away if you have: Marland Kitchen Throwing up (vomiting) . Loose bowel movements (diarrhea)  Reproduction Concerns . Pregnancy warning: It is not known if this drug may harm an unborn child. For this reason, be sure to talk with your doctor if you are pregnant or planning to become pregnant while getting this drug. . Genetic counseling is available for you to talk about the effects of this drug therapy on future pregnancies. Also, a genetic counselor can look at the possible risk of problems in the unborn baby due to this medicine if an exposure happens during pregnancy. . Breast feeding warning: It is not known if this drug passes into breast milk. For this  reason, women should talk to their doctor about the risks and benefits of breast feeding during treatment with this drug because this drug may enter the breast milk and badly harm a breast feeding baby.    Oxaliplatin (Generic Name) Other  Names: EloxatinTM  About This Drug Oxaliplatin is used to treat cancer. It is given in the vein (IV).  Will take 2 hours to infuse and will infuse with the leucovorin.    Possible Side Effects (More Common) . Effects on the nerves are called peripheral neuropathy. You may feel numbness, tingling, or pain in your hands and feet. Cold temperatures can make it worse. Avoid cold drinks with ice. Dress warmly and cover the skin if you go out in the cold. Wear socks or gloves if you have to touch cold objects like cold flooring or items in the refrigerator or freezer. . It may be hard for you to button your clothes, open jars, or walk as usual. The effect on the nerves may get worse with more doses of the drug. These effects get better in some people after the drug is stopped but it does not get better in all people . Bone marrow depression: This is a decrease in the number of white blood cells, red blood cells, and platelets. It may increase your risk for infection, make you tired and weak (fatigue), and raise your risk of bleeding. . Nausea and throwing up (vomiting). These symptoms may happen within a few hours after your treatment and may last up to 72 hours. Medicines are available to stop or lessen these side effects. . Electrolyte changes. Your blood will be checked for electrolyte changes as needed.  Possible Side Effects (Less Common) . Skin and tissue irritation: This may involve redness, pain, warmth, or swelling at the IV site. This happens if the drug leaks out of the vein and into nearby tissue. . Serious allergic reactions including anaphylaxis are rare. While you are getting this drug in your vein (IV), tell your nurse right away if you have  any of these symptoms of an allergic reaction: . Trouble catching your breath. . Feeling like your tongue or throat are swelling. . Feeling your heart beat quickly or in a not normal way (palpitations). . Feeling dizzy or lightheaded. . Flushing, itching, rash, or hives. . Changes in your liver function. Your doctor will check your liver function as needed.  Treating Side Effects Oxaliplatin (Generic Name) continued Page 12 of 14 . Do not drink cold drinks or use ice cubes in drinks. Drink fluids at room temperature or warmer. Drink through a straw. . Wear gloves to touch cold objects. Be aware that most metals are cold to the touch, especially in winter. Examples are your car door handle and your mail box latch. . Wear warm clothing in cold weather at all times. Cover your mouth and nose with a scarf or a pulldown cap (ski cap) to warm the air that goes to your lungs. . Ask your doctor or nurse about medicine to treat nausea, throwing up, headache, or loose bowel movements. . While you are getting this drug in your vein, tell your nurse right away if you have any pain, redness, or swelling at the site of the IV infusion. . Mouth care is very important. Your mouth care should consist of routine, gentle cleaning of your teeth or dentures and rinsing your mouth with a mixture of 1/2 teaspoon of salt in 8 ounces of water or  teaspoon of baking soda in 8 ounces of water. This should be done at least after each meal and at bedtime. . Drink 6-8 cups of fluids each day unless your doctor has told you to limit your fluid intake due to some other health  problem. A cup is 8 ounces of fluid. If you throw up or have loose bowel movements you should drink more fluids so that you do not become dehydrated (lack water in the body due to losing too much fluid).  Food and Drug Interactions There are no known interactions of oxaliplatin with food. This drug may interact with other medicines. Tell your  doctor and pharmacist about all the medicines and dietary supplements (vitamins, minerals, herbs and others) that you are taking at this time. The safety and use of dietary supplements and alternative diets are often not known. Using these might affect your cancer or interfere with your treatment. Until more is known, you should not use dietary supplements or alternative diets without your cancer doctor's help.  When to Call the Doctor Call your doctor or nurse right away if you have any of these symptoms: . Fever of 100.5 F (38 C) or above . Chills . Easy bruising or bleeding . Wheezing or trouble breathing . Rash or itching . Feeling dizzy or lightheaded . Feeling that your heart is beating in a fast or not normal way (palpitations) . Loose bowel movements (diarrhea) more than 4 times a day or diarrhea with weakness or feeling lightheaded . Blurred vision or other changes in eyesight . Pain when passing urine; blood in urine . Pain in your lower back or side . Feeling confused or agitated . Nausea that stops you from eating or drinking . Throwing up more than 3 times a day . Chest pain or symptoms of a heart attack. Most heart attacks involve pain in the center of the chest that lasts more than a few minutes. The pain may go away and come back. It can feel like pressure, squeezing, fullness, or pain. Sometimes pain is felt in one or both arms, the back, neck, jaw, or stomach. If any of these symptoms last 2 minutes, call 911. Marland Kitchen Symptoms of a stroke such as sudden numbness or weakness of your face, arm, or leg, mostly on one side of your body; sudden confusion, trouble speaking or understanding; sudden trouble seeing in one or both eyes; sudden trouble walking, feeling dizzy, loss of balance or coordination; or sudden, bad headache with no known cause. If you have any of these symptoms for 2 minutes, call 911. . Signs of liver problems: dark urine, pale bowel movements, bad stomach  pain, feeling very tired and weak, itching, or yellowing of the eyes or skin. Call your doctor or nurse as soon as possible if any of these symptoms happen: . Change in hearing, ringing in the ears . Decreased urine . Unusual thirst or passing urine often . Pain in your mouth or throat that makes it hard to eat or drink . Nausea that is not relieved by prescribed medicines . Rash that is not relieved by prescribed medicines . Heavy menstrual period that lasts longer than normal . Numbness, tingling, decreased feeling or weakness in fingers, toes, arms, or legs . Trouble walking or changes in the way you walk, feeling clumsy when buttoning clothes, opening jars, or other routine hand motions . Swelling of legs, ankles, or feet . Weight gain of 5 pounds in one week (fluid retention) . Lasting loss of appetite or rapid weight loss of five pounds in a week . Fatigue that interferes with your daily activities . Headache that does not go away . Painful, red, or swollen areas on your hands or feet. . No bowel movement for 3 days or  you feel uncomfortable . Extreme weakness that interferes with normal activities  Reproduction Concerns Infertility Warning: Sexual problems and reproduction concerns may happen. In both men and women, this drug may affect your ability to have children. This cannot be determined before your treatment. Talk with your doctor or nurse if you plan to have children. Ask for information on sperm or egg banking. In men, this drug may interfere with your ability to make sperm, but it should not change your ability to have sexual relations. In women, menstrual bleeding may become irregular or stop while you are getting this drug. Do not assume that you cannot become pregnant if you do not have a menstrual period. Women may go through signs of menopause (change of life) like vaginal dryness or itching. Vaginal lubricants can be used to lessen vaginal dryness, itching, and  pain during sexual relations. Genetic counseling is available for you to talk about the effects of this drug therapy on future pregnancies. Also, a genetic counselor can look at the possible risk of problems in the unborn baby due to this medicine if an exposure happens during pregnancy. Pregnancy warning: This drug may have harmful effects on the unborn child, so effective methods of birth control should be used during your cancer treatment. Breast feeding warning: It is not known if this drug passes into breast milk. For this reason, women should talk to their doctor about the risks and benefits of breast feeding during treatment with this drug because this drug may enter the breast milk and badly harm a breast feeding baby.    SELF CARE ACTIVITIES WHILE ON CHEMOTHERAPY: Hydration Increase your fluid intake 48 hours prior to treatment and drink at least 8 to 12 cups (64 ounces) of water/decaff beverages per day after treatment. You can still have your cup of coffee or soda but these beverages do not count as part of your 8 to 12 cups that you need to drink daily. No alcohol intake.  Medications Continue taking your normal prescription medication as prescribed.  If you start any new herbal or new supplements please let us know first to make sure it is safe.  Mouth Care Have teeth cleaned professionally before starting treatment. Keep dentures and partial plates clean. Use soft toothbrush and do not use mouthwashes that contain alcohol. Biotene is a good mouthwash that is available at most pharmacies or may be ordered by calling [phone]. Use warm salt water gargles (1 teaspoon salt per 1 quart warm water) before and after meals and at bedtime. Or you may rinse with 2 tablespoons of three-percent hydrogen peroxide mixed in eight ounces of water. If you are still having problems with your mouth or sores in your mouth please call the clinic. If you need dental work, please let the doctor  know before you go for your appointment so that we can coordinate the best possible time for you in regards to your chemo regimen. You need to also let your dentist know that you are actively taking chemo. We may need to do labs prior to your dental appointment.   Skin Care Always use sunscreen that has not expired and with SPF (Sun Protection Factor) of 50 or higher. Wear hats to protect your head from the sun. Remember to use sunscreen on your hands, ears, face, & feet.  Use good moisturizing lotions such as udder cream, eucerin, or even Vaseline. Some chemotherapies can cause dry skin, color changes in your skin and nails.    Marland Kitchen  Avoid long, hot showers or baths. . Use gentle, fragrance-free soaps and laundry detergent. . Use moisturizers, preferably creams or ointments rather than lotions because the thicker consistency is better at preventing skin dehydration. Apply the cream or ointment within 15 minutes of showering. Reapply moisturizer at night, and moisturize your hands every time after you wash them.  Hair Loss (if your doctor says your hair will fall out)  . If your doctor says that your hair is likely to fall out, decide before you begin chemo whether you want to wear a wig. You may want to shop before treatment to match your hair color. . Hats, turbans, and scarves can also camouflage hair loss, although some people prefer to leave their heads uncovered. If you go bare-headed outdoors, be sure to use sunscreen on your scalp. . Cut your hair short. It eases the inconvenience of shedding lots of hair, but it also can reduce the emotional impact of watching your hair fall out. . Don't perm or color your hair during chemotherapy. Those chemical treatments are already damaging to hair and can enhance hair loss. Once your chemo treatments are done and your hair has grown back, it's OK to resume dyeing or perming hair. With chemotherapy, hair loss is almost always temporary. But when it grows  back, it may be a different color or texture. In older adults who still had hair color before chemotherapy, the new growth may be completely gray.  Often, new hair is very fine and soft.  Infection Prevention Please wash your hands for at least 30 seconds using warm soapy water. Handwashing is the #1 way to prevent the spread of germs. Stay away from sick people or people who are getting over a cold. If you develop respiratory systems such as green/yellow mucus production or productive cough or persistent cough let us know and we will see if you need an antibiotic. It is a good idea to keep a pair of gloves on when going into grocery stores/[employer] to decrease your risk of coming into contact with germs on the carts, etc. Carry alcohol hand gel with you at all times and use it frequently if out in public. If your temperature reaches 100.5 or higher please call the clinic and let us know.  If it is after hours or on the weekend please go to the ER if your temperature is over 100.5.  Please have your own personal thermometer at home to use.    Sex and bodily fluids If you are going to have sex, a condom must be used to protect the person that isn't taking chemotherapy. Chemo can decrease your libido (sex drive). For a few days after chemotherapy, chemotherapy can be excreted through your bodily fluids.  When using the toilet please close the lid and flush the toilet twice.  Do this for a few day after you have had chemotherapy.     Effects of chemotherapy on your sex life Some changes are simple and won't last long. They won't affect your sex life permanently. Sometimes you may feel: . too tired . not strong enough to be very active . sick or sore  . not in the mood . anxious or low Your anxiety might not seem related to sex. For example, you may be worried about the cancer and how your treatment is going. Or you may be worried about money, or about how you family are coping with your illness. These  things can cause stress, which can affect your  interest in sex. It's important to talk to your partner about how you feel. Remember - the changes to your sex life don't usually last long. There's usually no medical reason to stop having sex during chemo. The drugs won't have any long term physical effects on your performance or enjoyment of sex. Cancer can't be passed on to your partner during sex  Contraception It's important to use reliable contraception during treatment. Avoid getting pregnant while you or your partner are having chemotherapy. This is because the drugs may harm the baby. Sometimes chemotherapy drugs can leave a man or woman infertile.  This means you would not be able to have children in the future. You might want to talk to someone about permanent infertility. It can be very difficult to learn that you may no longer be able to have children. Some people find counselling helpful. There might be ways to preserve your fertility, although this is easier for men than for women. You may want to speak to a fertility expert. You can talk about sperm banking or harvesting your eggs. You can also ask about other fertility options, such as donor eggs. If you have or have had breast cancer, your doctor might advise you not to take the contraceptive pill. This is because the hormones in it might affect the cancer.  It is not known for sure whether or not chemotherapy drugs can be passed on through semen or secretions from the vagina. Because of this some doctors advise people to use a barrier method if you have sex during treatment. This applies to vaginal, anal or oral sex. Generally, doctors advise a barrier method only for the time you are actually having the treatment and for about a week after your treatment. Advice like this can be worrying, but this does not mean that you have to avoid being intimate with your partner. You can still have close contact with your partner and continue to enjoy  sex.  Animals If you have cats or birds we just ask that you not change the litter or change the cage.  Please have someone else do this for you while you are on chemotherapy.   Food Safety During and After Cancer Treatment Food safety is important for people both during and after cancer treatment. Cancer and cancer treatments, such as chemotherapy, radiation therapy, and stem cell/bone marrow transplantation, often weaken the immune system. This makes it harder for your body to protect itself from foodborne illness, also called food poisoning. Foodborne illness is caused by eating food that contains harmful bacteria, parasites, or viruses.  Foods to avoid Some foods have a higher risk of becoming tainted with bacteria. These include: Marland Kitchen Unwashed fresh fruit and vegetables, especially leafy vegetables that can hide dirt and other contaminants . Raw sprouts, such as alfalfa sprouts . Raw or undercooked beef, especially ground beef, or other raw or undercooked meat and poultry . Fatty, fried, or spicy foods immediately before or after treatment.  These can sit heavy on your stomach and make you feel nauseous. . Raw or undercooked shellfish, such as oysters. . Sushi and sashimi, which often contain raw fish.  . Unpasteurized beverages, such as unpasteurized fruit juices, raw milk, raw yogurt, or cider . Undercooked eggs, such as soft boiled, over easy, and poached; raw, unpasteurized eggs; or foods made with raw egg, such as homemade raw cookie dough and homemade mayonnaise Simple steps for food safety Shop smart. . Do not buy food stored or displayed in an  unclean area. . Do not buy bruised or damaged fruits or vegetables. . Do not buy cans that have cracks, dents, or bulges. . Pick up foods that can spoil at the end of your shopping trip and store them in a cooler on the way home. Prepare and clean up foods carefully. . Rinse all fresh fruits and vegetables under running water, and dry them  with a clean towel or paper towel. . Clean the top of cans before opening them. . After preparing food, wash your hands for 20 seconds with hot water and soap. Pay special attention to areas between fingers and under nails. . Clean your utensils and dishes with hot water and soap. Marland Kitchen Disinfect your kitchen and cutting boards using 1 teaspoon of liquid, unscented bleach mixed into 1 quart of water.   Dispose of old food. . Eat canned and packaged food before its expiration date (the "use by" or "best before" date). . Consume refrigerated leftovers within 3 to 4 days. After that time, throw out the food. Even if the food does not smell or look spoiled, it still may be unsafe. Some bacteria, such as Listeria, can grow even on foods stored in the refrigerator if they are kept for too long. Take precautions when eating out. . At restaurants, avoid buffets and salad bars where food sits out for a long time and comes in contact with many people. Food can become contaminated when someone with a virus, often a norovirus, or another "bug" handles it. . Put any leftover food in a "to-go" container yourself, rather than having the server do it. And, refrigerate leftovers as soon as you get home. . Choose restaurants that are clean and that are willing to prepare your food as you order it cooked.    MEDICATIONS: Dexamethasone 4mg  tablet.  Take 2 tablets (8 mg total) by mouth daily. Start the day after chemotherapy for 2 days. Take with food.  Take with food.                                                                                                                                                               Zofran/Ondansetron 8mg  tablet. Take 1 tablet every 8 hours as needed for nausea/vomiting. (#1 nausea med to take, this can constipate)  Compazine/Prochlorperazine 10mg  tablet. Take 1 tablet every 6 hours as needed for nausea/vomiting. (#2 nausea med to take, this can make you sleepy)   EMLA cream.  Apply a quarter size amount to port site 1 hour prior to chemo. Do not rub in. Cover with plastic wrap.   Over-the-Counter Meds:  Miralax 1 capful in 8 oz of fluid daily. May increase to two times a day if needed. This is a stool softener. If this doesn't work proceed you can  add:  Senokot S-start with 1 tablet two times a day and increase to 4 tablets two times a day if needed. (total of 8 tablets in a 24 hour period). This is a stimulant laxative.   Call us if this does not help your bowels move.   Imodium 2mg  capsule. Take 2 capsules after the 1st loose stool and then 1 capsule every 2 hours until you go a total of 12 hours without having a loose stool. Call the [facility] if loose stools continue. If diarrhea occurs @ bedtime, take 2 capsules @ bedtime. Then take 2 capsules every 4 hours until morning. Call [facility].     Constipation Sheet *Miralax in 8 oz of fluid daily.  May increase to two times a day if needed.  This is a stool softener.  If this not enough to keep your bowel regular:  You can add:  *Senokot S, start with one tablet twice a day and can increase to 4 tablets twice a day if needed.  This is a stimulant laxative.   Sometimes when you take pain medication you need BOTH a medicine to keep your stool soft and a medicine to help your bowel push it out!  Please call if the above does not work for you.   Do not go more than 2 days without a bowel movement.  It is very important that you do not become constipated.  It will make you feel sick to your stomach (nausea) and can cause abdominal pain and vomiting.    Diarrhea Sheet  If you are having loose stools/diarrhea, please purchase Imodium and begin taking as outlined:  At the first sign of poorly formed or loose stools you should begin taking Imodium(loperamide) 2 mg capsules.  Take two caplets (4mg ) followed by one caplet (2mg ) every 2 hours until you have had no diarrhea for 12 hours.  During the night take  two caplets (4mg ) at bedtime and continue every 4 hours during the night until the morning.  Stop taking Imodium only after there is no sign of diarrhea for 12 hours.    Always call the [facility] if you are having loose stools/diarrhea that you can't get under control.  Loose stools/disrrhea leads to dehydration (loss of water) in your body.  We have other options of trying to get the loose stools/diarrhea to stopped but you must let us know!    Nausea Sheet  Zofran/Ondansetron 8mg  tablet. Take 1 tablet every 8 hours as needed for nausea/vomiting. (#1 nausea med to take, this can constipate)  Compazine/Prochlorperazine 10mg  tablet. Take 1 tablet every 6 hours as needed for nausea/vomiting. (#2 nausea med to take, this can make you sleepy)  You can take these medications together or separately.  We would first like for you to try the Ondansetron by itself and then take the Prochloperizine if needed. But you are allowed to take both medications at the same time if your nausea is that severe.  If you are having persistent nausea (nausea that does not stop) please take these medications on a staggered schedule so that the nausea medication stays in your body.  Please call the [facility] and let us know the amount of nausea that you are experiencing.  If you begin to vomit, you need to call the [facility] and if it is the weekend and you have vomited more than one time and cant get it to stop-go to the Emergency Room.  Persistent nausea/vomiting can lead to  dehydration (loss of fluid in your body) and will make you feel terrible.   Ice chips, sips of clear liquids, foods that are @ room temperature, crackers, and toast tend to be better tolerated.     SYMPTOMS TO REPORT AS SOON AS POSSIBLE AFTER TREATMENT:  FEVER GREATER THAN 100.5 F  CHILLS WITH OR WITHOUT FEVER  NAUSEA AND VOMITING THAT IS NOT CONTROLLED WITH YOUR NAUSEA MEDICATION  UNUSUAL SHORTNESS OF BREATH  UNUSUAL BRUISING OR  BLEEDING  TENDERNESS IN MOUTH AND THROAT WITH OR WITHOUT PRESENCE OF ULCERS  URINARY PROBLEMS  BOWEL PROBLEMS  UNUSUAL RASH    Wear comfortable clothing and clothing appropriate for easy access to any Portacath or PICC line. Let us know if there is anything that we can do to make your therapy better!     What to do if you need assistance after hours or on the weekends: CALL [phone].  HOLD on the line, do not hang up.  You will hear multiple messages but at the end you will be connected with a nurse triage line.  They will contact the doctor if necessary.  Most of the time they will be able to assist you.  Do not call the hospital operator.       I have been informed and understand all of the instructions given to me and have received a copy. I have been instructed to call the clinic [phone] or my family physician as soon as possible for continued medical care, if indicated. I do not have any more questions at this time but understand that I may call the [facility] or the Patient Navigator at [phone] during office hours should I have questions or need assistance in obtaining follow-up care.

## 2016-09-07 NOTE — Progress Notes (Signed)
Her 2 ordered on pathology [id number] with [facility] in pathology.

## 2016-09-08 ENCOUNTER — Other Ambulatory Visit: Payer: Self-pay

## 2016-09-08 ENCOUNTER — Encounter (HOSPITAL_COMMUNITY): Admission: RE | Admit: 2016-09-08 | Payer: 59 | Source: Ambulatory Visit

## 2016-09-08 ENCOUNTER — Encounter (HOSPITAL_COMMUNITY): Payer: Self-pay | Admitting: Emergency Medicine

## 2016-09-08 ENCOUNTER — Emergency Department (HOSPITAL_COMMUNITY): Payer: 59

## 2016-09-08 ENCOUNTER — Inpatient Hospital Stay (HOSPITAL_COMMUNITY)
Admission: EM | Admit: 2016-09-08 | Discharge: 2016-09-11 | DRG: 871 | Disposition: A | Discharge status: Home / Self Care | Payer: 59 | Attending: Internal Medicine | Admitting: Internal Medicine

## 2016-09-08 DIAGNOSIS — D638 Anemia in other chronic diseases classified elsewhere: Secondary | ICD-10-CM | POA: Diagnosis present

## 2016-09-08 DIAGNOSIS — C168 Malignant neoplasm of overlapping sites of stomach: Secondary | ICD-10-CM | POA: Diagnosis not present

## 2016-09-08 DIAGNOSIS — I2699 Other pulmonary embolism without acute cor pulmonale: Secondary | ICD-10-CM | POA: Diagnosis present

## 2016-09-08 DIAGNOSIS — C799 Secondary malignant neoplasm of unspecified site: Secondary | ICD-10-CM

## 2016-09-08 DIAGNOSIS — R9431 Abnormal electrocardiogram [ECG] [EKG]: Secondary | ICD-10-CM | POA: Diagnosis not present

## 2016-09-08 DIAGNOSIS — Z95828 Presence of other vascular implants and grafts: Secondary | ICD-10-CM | POA: Diagnosis not present

## 2016-09-08 DIAGNOSIS — D62 Acute posthemorrhagic anemia: Secondary | ICD-10-CM | POA: Diagnosis present

## 2016-09-08 DIAGNOSIS — A419 Sepsis, unspecified organism: Secondary | ICD-10-CM | POA: Diagnosis present

## 2016-09-08 DIAGNOSIS — I82422 Acute embolism and thrombosis of left iliac vein: Secondary | ICD-10-CM | POA: Diagnosis present

## 2016-09-08 DIAGNOSIS — K449 Diaphragmatic hernia without obstruction or gangrene: Secondary | ICD-10-CM | POA: Diagnosis present

## 2016-09-08 DIAGNOSIS — Z85028 Personal history of other malignant neoplasm of stomach: Secondary | ICD-10-CM | POA: Diagnosis not present

## 2016-09-08 DIAGNOSIS — N183 Chronic kidney disease, stage 3 (moderate): Secondary | ICD-10-CM | POA: Diagnosis present

## 2016-09-08 DIAGNOSIS — K922 Gastrointestinal hemorrhage, unspecified: Secondary | ICD-10-CM | POA: Diagnosis present

## 2016-09-08 DIAGNOSIS — R680 Hypothermia, not associated with low environmental temperature: Secondary | ICD-10-CM | POA: Diagnosis present

## 2016-09-08 DIAGNOSIS — Z79899 Other long term (current) drug therapy: Secondary | ICD-10-CM

## 2016-09-08 DIAGNOSIS — I82402 Acute embolism and thrombosis of unspecified deep veins of left lower extremity: Secondary | ICD-10-CM | POA: Diagnosis present

## 2016-09-08 DIAGNOSIS — C169 Malignant neoplasm of stomach, unspecified: Secondary | ICD-10-CM | POA: Diagnosis present

## 2016-09-08 DIAGNOSIS — I82502 Chronic embolism and thrombosis of unspecified deep veins of left lower extremity: Secondary | ICD-10-CM | POA: Diagnosis not present

## 2016-09-08 DIAGNOSIS — D649 Anemia, unspecified: Secondary | ICD-10-CM | POA: Diagnosis not present

## 2016-09-08 DIAGNOSIS — I82492 Acute embolism and thrombosis of other specified deep vein of left lower extremity: Secondary | ICD-10-CM | POA: Diagnosis not present

## 2016-09-08 DIAGNOSIS — Z8249 Family history of ischemic heart disease and other diseases of the circulatory system: Secondary | ICD-10-CM | POA: Diagnosis not present

## 2016-09-08 DIAGNOSIS — E875 Hyperkalemia: Secondary | ICD-10-CM

## 2016-09-08 DIAGNOSIS — N179 Acute kidney failure, unspecified: Secondary | ICD-10-CM

## 2016-09-08 DIAGNOSIS — T68XXXA Hypothermia, initial encounter: Secondary | ICD-10-CM

## 2016-09-08 DIAGNOSIS — N390 Urinary tract infection, site not specified: Secondary | ICD-10-CM | POA: Diagnosis present

## 2016-09-08 LAB — BASIC METABOLIC PANEL
ANION GAP: 18 — AB (ref 5–15)
Anion gap: 19 — ABNORMAL HIGH (ref 5–15)
BUN: 103 mg/dL — ABNORMAL HIGH (ref 6–20)
BUN: 100 mg/dL — ABNORMAL HIGH (ref 6–20)
CALCIUM: 7.9 mg/dL — AB (ref 8.9–10.3)
CHLORIDE: 101 mmol/L (ref 101–111)
CO2: 12 mmol/L — ABNORMAL LOW (ref 22–32)
CO2: 12 mmol/L — ABNORMAL LOW (ref 22–32)
Calcium: 7.9 mg/dL — ABNORMAL LOW (ref 8.9–10.3)
Chloride: 99 mmol/L — ABNORMAL LOW (ref 101–111)
Creatinine, Ser: 6.76 mg/dL — ABNORMAL HIGH (ref 0.44–1.00)
Creatinine, Ser: 5.98 mg/dL — ABNORMAL HIGH (ref 0.44–1.00)
GFR calc Af Amer: 7 mL/min — ABNORMAL LOW (ref >60)
GFR calc non Af Amer: 6 mL/min — ABNORMAL LOW (ref >60)
GFR, EST AFRICAN AMERICAN: 8 mL/min — AB (ref >60)
GFR, EST NON AFRICAN AMERICAN: 7 mL/min — AB (ref >60)
Glucose, Bld: 109 mg/dL — ABNORMAL HIGH (ref 65–99)
Glucose, Bld: 103 mg/dL — ABNORMAL HIGH (ref 65–99)
POTASSIUM: 6.3 mmol/L — AB (ref 3.5–5.1)
Potassium: 6.2 mmol/L — ABNORMAL HIGH (ref 3.5–5.1)
SODIUM: 131 mmol/L — AB (ref 135–145)
Sodium: 130 mmol/L — ABNORMAL LOW (ref 135–145)

## 2016-09-08 LAB — URINALYSIS, ROUTINE W REFLEX MICROSCOPIC
Glucose, UA: 50 mg/dL — AB
Ketones, ur: NEGATIVE mg/dL
LEUKOCYTES UA: NEGATIVE
NITRITE: NEGATIVE
PH: 5 (ref 5.0–8.0)
Protein, ur: 30 mg/dL — AB
SPECIFIC GRAVITY, URINE: 1.023 (ref 1.005–1.030)

## 2016-09-08 LAB — CBC WITH DIFFERENTIAL/PLATELET
BASOS ABS: 0 10*3/uL (ref 0.0–0.1)
Basophils Relative: 0 %
Eosinophils Absolute: 0 10*3/uL (ref 0.0–0.7)
Eosinophils Relative: 0 %
HEMATOCRIT: 17.5 % — AB (ref 36.0–46.0)
HEMOGLOBIN: 5.5 g/dL — AB (ref 12.0–15.0)
LYMPHS PCT: 8 %
Lymphs Abs: 1.7 10*3/uL (ref 0.7–4.0)
MCH: 26.2 pg (ref 26.0–34.0)
MCHC: 31.4 g/dL (ref 30.0–36.0)
MCV: 83.3 fL (ref 78.0–100.0)
MONO ABS: 0.7 10*3/uL (ref 0.1–1.0)
Monocytes Relative: 3 %
NEUTROS ABS: 18.9 10*3/uL — AB (ref 1.7–7.7)
Neutrophils Relative %: 89 %
Platelets: 159 10*3/uL (ref 150–400)
RBC: 2.1 MIL/uL — ABNORMAL LOW (ref 3.87–5.11)
RDW: 18.7 % — ABNORMAL HIGH (ref 11.5–15.5)
WBC: 21.4 10*3/uL — ABNORMAL HIGH (ref 4.0–10.5)

## 2016-09-08 LAB — HEPATIC FUNCTION PANEL
ALBUMIN: 2.2 g/dL — AB (ref 3.5–5.0)
ALT: 23 U/L (ref 14–54)
AST: 41 U/L (ref 15–41)
Alkaline Phosphatase: 77 U/L (ref 38–126)
BILIRUBIN DIRECT: 0.1 mg/dL (ref 0.1–0.5)
BILIRUBIN TOTAL: 0.3 mg/dL (ref 0.3–1.2)
Indirect Bilirubin: 0.2 mg/dL — ABNORMAL LOW (ref 0.3–0.9)
Total Protein: 6.1 g/dL — ABNORMAL LOW (ref 6.5–8.1)

## 2016-09-08 LAB — NA AND K (SODIUM & POTASSIUM), RAND UR
Potassium Urine: 49 mmol/L
Sodium, Ur: 25 mmol/L

## 2016-09-08 LAB — CREATININE, URINE, RANDOM: CREATININE, URINE: 175.09 mg/dL

## 2016-09-08 LAB — LACTIC ACID, PLASMA
Lactic Acid, Venous: 4.1 mmol/L (ref 0.5–1.9)
Lactic Acid, Venous: 2.7 mmol/L (ref 0.5–1.9)
Lactic Acid, Venous: 1.7 mmol/L (ref 0.5–1.9)

## 2016-09-08 LAB — TROPONIN I: Troponin I: <0.03 ng/mL (ref <0.03)

## 2016-09-08 LAB — PREPARE RBC (CROSSMATCH)

## 2016-09-08 LAB — SODIUM, URINE, RANDOM: Sodium, Ur: 25 mmol/L

## 2016-09-08 MED ORDER — PIPERACILLIN-TAZOBACTAM 3.375 G IVPB 30 MIN
3.3750 g | Freq: Once | INTRAVENOUS | Status: AC
  Administered 2016-09-08: 3.375 g via INTRAVENOUS
  Filled 2016-09-08: qty 50

## 2016-09-08 MED ORDER — SODIUM CHLORIDE 0.9 % IV SOLN
Freq: Once | INTRAVENOUS | Status: AC
Start: 2016-09-08 — End: 2016-09-08
  Administered 2016-09-08: 13:00:00 via INTRAVENOUS

## 2016-09-08 MED ORDER — SODIUM CHLORIDE 0.9 % IV SOLN
1.0000 g | Freq: Once | INTRAVENOUS | Status: AC
  Administered 2016-09-08: 1 g via INTRAVENOUS
  Filled 2016-09-08: qty 10

## 2016-09-08 MED ORDER — SODIUM CHLORIDE 0.9 % IV SOLN: Freq: Once | INTRAVENOUS | Status: DC

## 2016-09-08 MED ORDER — ACETAMINOPHEN 650 MG RE SUPP: 650.0000 mg | Freq: Four times a day (QID) | RECTAL | Status: DC | PRN

## 2016-09-08 MED ORDER — SODIUM POLYSTYRENE SULFONATE 15 GM/60ML PO SUSP
15.0000 g | Freq: Four times a day (QID) | ORAL | Status: AC
  Administered 2016-09-08: 15 g via ORAL
  Filled 2016-09-08: qty 60

## 2016-09-08 MED ORDER — DEXTROSE 50 % IV SOLN
50.0000 mL | Freq: Once | INTRAVENOUS | Status: AC
  Administered 2016-09-08: 50 mL via INTRAVENOUS
  Filled 2016-09-08: qty 50

## 2016-09-08 MED ORDER — SODIUM CHLORIDE 0.9 % IV BOLUS (SEPSIS)
1000.0000 mL | Freq: Once | INTRAVENOUS | Status: AC
  Administered 2016-09-08: 1000 mL via INTRAVENOUS

## 2016-09-08 MED ORDER — VANCOMYCIN HCL IN DEXTROSE 1-5 GM/200ML-% IV SOLN
1000.0000 mg | Freq: Once | INTRAVENOUS | Status: AC
  Administered 2016-09-08: 1000 mg via INTRAVENOUS
  Filled 2016-09-08: qty 200

## 2016-09-08 MED ORDER — SODIUM POLYSTYRENE SULFONATE 15 GM/60ML PO SUSP
15.0000 g | Freq: Once | ORAL | Status: AC
  Administered 2016-09-08: 15 g via ORAL
  Filled 2016-09-08: qty 60

## 2016-09-08 MED ORDER — CALCIUM GLUCONATE 10 % IV SOLN
1.0000 g | Freq: Once | INTRAVENOUS | Status: AC
  Administered 2016-09-08: 1 g via INTRAVENOUS
  Filled 2016-09-08: qty 10

## 2016-09-08 MED ORDER — SODIUM BICARBONATE 8.4 % IV SOLN
25.0000 meq | Freq: Once | INTRAVENOUS | Status: AC
  Administered 2016-09-08: 25 meq via INTRAVENOUS
  Filled 2016-09-08: qty 50

## 2016-09-08 MED ORDER — ACETAMINOPHEN 325 MG PO TABS
650.0000 mg | ORAL_TABLET | Freq: Four times a day (QID) | ORAL | Status: DC | PRN
  Administered 2016-09-09: 650 mg via ORAL
  Filled 2016-09-08: qty 2

## 2016-09-08 MED ORDER — PIPERACILLIN SOD-TAZOBACTAM SO 2.25 (2-0.25) G IV SOLR
2.2500 g | Freq: Three times a day (TID) | INTRAVENOUS | Status: DC
  Administered 2016-09-08 – 2016-09-11 (×8): 2.25 g via INTRAVENOUS
  Filled 2016-09-08 (×12): qty 2.25

## 2016-09-08 MED ORDER — LACTATED RINGERS IV BOLUS (SEPSIS)
1000.0000 mL | Freq: Once | INTRAVENOUS | Status: AC
  Administered 2016-09-08: 1000 mL via INTRAVENOUS

## 2016-09-08 MED ORDER — INSULIN ASPART 100 UNIT/ML ~~LOC~~ SOLN
5.0000 [IU] | Freq: Once | SUBCUTANEOUS | Status: AC
  Administered 2016-09-08: 5 [IU] via SUBCUTANEOUS

## 2016-09-08 MED ORDER — SODIUM POLYSTYRENE SULFONATE 15 GM/60ML PO SUSP
30.0000 g | Freq: Once | ORAL | Status: AC
  Administered 2016-09-08: 30 g via ORAL
  Filled 2016-09-08: qty 120

## 2016-09-08 MED ORDER — SODIUM CHLORIDE 0.9 % IV SOLN
INTRAVENOUS | Status: DC
  Administered 2016-09-08 – 2016-09-10 (×3): via INTRAVENOUS

## 2016-09-08 NOTE — ED Notes (Signed)
CRITICAL VALUE ALERT  Critical Value: hgb 5.5  Date & Time Notied: 9791   09/08/16  Provider Notified: Eulis Foster  Orders Received/Actions taken:

## 2016-09-08 NOTE — ED Notes (Signed)
Patient has no complaints after 15 minutes blood administration. Patient lying in bed sleeping at this time. NAD noted. Blood administration rate increased to 150 ml/hr.

## 2016-09-08 NOTE — ED Provider Notes (Signed)
[facility] DEPT Provider Note   CSN: [id number] Arrival date & time: 09/08/16  1054     History   Chief Complaint Chief Complaint  Patient presents with  . Leg Swelling    HPI Jodi Collins is a 66 y.o. female.  She presents for evaluation of left lower leg pain.  She describes the pain as "sharp."  No recent trauma.  She has a known left leg DVT.  Recently was utilized, and diagnosed with rectal bleeding secondary to stomach tumor, and had an IVC filter placed, because of the left leg DVT.  This was done because she could not be anticoagulated because of the Gastro- intestinal bleeding.  She came here by EMS.  She was due to be transfused today for persistent anemia.  HPI  Past Medical History:  Diagnosis Date  . Anemia   . DVT (deep venous thrombosis) ([place])   . Stomach cancer [facility])     Patient Active Problem List   Diagnosis Date Noted  . Gastric cancer ([place]) 09/06/2016  . Pulmonary emboli ([place]) 08/28/2016  . Anemia 08/28/2016    Past Surgical History:  Procedure Laterality Date  . CERVICAL CONIZATION W/BX  2012   high grade squamous intraepithelial dysplasia.  Dr Garwin Brothers.   . COLONOSCOPY W/ POLYPECTOMY  10/2006 and 10/2011   Dr Collene Mares.  had adenomatous polyps.    . ESOPHAGOGASTRODUODENOSCOPY N/A 08/30/2016   Procedure: ESOPHAGOGASTRODUODENOSCOPY (EGD);  Surgeon: Carol Ada, MD;  Location: [place];  Service: Gastroenterology;  Laterality: N/A;  . IR FLUORO GUIDE PORT INSERTION RIGHT  09/01/2016  . IR IVC FILTER PLMT / S&I /IMG GUID/MOD SED  08/29/2016  . IR US GUIDE VASC ACCESS RIGHT  09/01/2016    OB History    Gravida Para Term Preterm AB Living   0 0 0 0 0 0   SAB TAB Ectopic Multiple Live Births   0 0 0 0 0       Home Medications    Prior to Admission medications   Medication Sig Start Date End Date Taking? Authorizing Provider  fluorouracil CALGB 35701 in sodium chloride 0.9 % 150 mL Inject into the vein over 96 hr. Every 2 weeks,  wears pump for 46 hours   Yes [provider]  leucovorin in dextrose 5 % 250 mL Inject into the vein once. Every 2 weeks   Yes [provider]  lidocaine-prilocaine (EMLA) cream Apply to affected area once 09/07/16  Yes Twana First, MD  ondansetron (ZOFRAN) 8 MG tablet Take 1 tablet (8 mg total) by mouth 2 (two) times daily as needed for refractory nausea / vomiting. Start on day 3 after chemotherapy. 09/07/16  Yes Twana First, MD  oxyCODONE-acetaminophen (PERCOCET/ROXICET) 5-325 MG tablet Take 1-2 tablets by mouth every 4 (four) hours as needed for severe pain. 09/07/16  Yes Twana First, MD  prochlorperazine (COMPAZINE) 10 MG tablet Take 1 tablet (10 mg total) by mouth every 6 (six) hours as needed (Nausea or vomiting). 09/07/16  Yes Twana First, MD  dexamethasone (DECADRON) 4 MG tablet Take 2 tablets (8 mg total) by mouth daily. Start the day after chemotherapy for 2 days. Take with food. 09/07/16   Twana First, MD  OXALIPLATIN IV Inject into the vein. Every 2 weeks    [provider]    Family History Family History  Problem Relation Age of Onset  . Hypertension Mother   . Diabetes Mother     Social History Social History  Substance Use Topics  .  Smoking status: Never Smoker  . Smokeless tobacco: Never Used  . Alcohol use Yes     Comment: rarely     Allergies   Patient has no known allergies.   Review of Systems Review of Systems   Physical Exam Updated Vital Signs BP 117/68 (BP Location: Left Arm)   Pulse 90   Temp 98.1 F (36.7 C) (Core (Comment))   Resp 18   Ht 5\' 2"  (1.575 m)   Wt 59 kg (130 lb)   SpO2 98%   BMI 23.78 kg/m   Physical Exam   ED Treatments / Results  Labs (all labs ordered are listed, but only abnormal results are displayed) Labs Reviewed  CBC WITH DIFFERENTIAL/PLATELET - Abnormal; Notable for the following:       Result Value   WBC 21.4 (*)    RBC 2.10 (*)    Hemoglobin 5.5 (*)    HCT 17.5 (*)    RDW 18.7  (*)    Neutro Abs 18.9 (*)    All other components within normal limits  BASIC METABOLIC PANEL - Abnormal; Notable for the following:    Sodium 130 (*)    Potassium 6.2 (*)    Chloride 99 (*)    CO2 12 (*)    Glucose, Bld 109 (*)    BUN 103 (*)    Creatinine, Ser 6.76 (*)    Calcium 7.9 (*)    GFR calc non Af Amer 6 (*)    GFR calc Af Amer 7 (*)    Anion gap 19 (*)    All other components within normal limits  LACTIC ACID, PLASMA - Abnormal; Notable for the following:    Lactic Acid, Venous 4.1 (*)    All other components within normal limits  URINALYSIS, ROUTINE W REFLEX MICROSCOPIC - Abnormal; Notable for the following:    Color, Urine AMBER (*)    APPearance CLOUDY (*)    Glucose, UA 50 (*)    Hgb urine dipstick MODERATE (*)    Bilirubin Urine SMALL (*)    Protein, ur 30 (*)    Bacteria, UA RARE (*)    Squamous Epithelial / LPF 0-5 (*)    All other components within normal limits  HEPATIC FUNCTION PANEL - Abnormal; Notable for the following:    Total Protein 6.1 (*)    Albumin 2.2 (*)    Indirect Bilirubin 0.2 (*)    All other components within normal limits  CULTURE, BLOOD (ROUTINE X 2)  CULTURE, BLOOD (ROUTINE X 2)  URINE CULTURE  TROPONIN I  LACTIC ACID, PLASMA  SODIUM, URINE, RANDOM   Hemoglobin  Date Value Ref Range Status  09/08/2016 5.5 (LL) 12.0 - 15.0 g/dL Final    Comment:    REPEATED TO VERIFY CRITICAL RESULT CALLED TO, READ BACK BY AND VERIFIED WITH: EDWARDS,CHRISTY 09/08/16 @ 1323 BY JTORTORICI DELTA CHECK NOTED   09/06/2016 7.2 (L) 12.0 - 15.0 g/dL Final  09/01/2016 9.5 (L) 12.0 - 15.0 g/dL Final  08/30/2016 10.0 (L) 12.0 - 15.0 g/dL Final   BUN  Date Value Ref Range Status  09/08/2016 103 (H) 6 - 20 mg/dL Final    Comment:    RESULTS CONFIRMED BY MANUAL DILUTION  09/06/2016 70 (H) 6 - 20 mg/dL Final  09/01/2016 14 6 - 20 mg/dL Final  08/30/2016 18 6 - 20 mg/dL Final   Creatinine, Ser  Date Value Ref Range Status  09/08/2016 6.76 (H)  0.44 - 1.00 mg/dL Final  Comment:    DELTA CHECK NOTED  09/06/2016 4.16 (H) 0.44 - 1.00 mg/dL Final  09/01/2016 0.86 0.44 - 1.00 mg/dL Final  08/30/2016 0.99 0.44 - 1.00 mg/dL Final      EKG  EKG Interpretation None       Radiology Dg Chest Port 1 View  Result Date: 09/08/2016 CLINICAL DATA:  66 year old female with a history of leg swelling EXAM: PORTABLE CHEST 1 VIEW COMPARISON:  CT 08/28/2016, port catheter 09/01/2016 FINDINGS: Cardiomediastinal silhouette unchanged with left heart border partially obscured by overlying diaphragmatic hernia. 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 No pneumothorax. No large pleural effusion. No confluent airspace disease. Retrocardiac region not well evaluated. Asymmetric elevation of the left hemidiaphragm in this patient with known diaphragmatic hernia better demonstrated on prior CT. IMPRESSION: Re- demonstration of left-sided diaphragmatic hernia, with no definite evidence of lobar pneumonia. 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 Electronically Signed   By: Corrie Mckusick D.O.   On: 09/08/2016 14:55    Procedures Procedures (including critical care time)  Medications Ordered in ED Medications  vancomycin (VANCOCIN) IVPB 1000 mg/200 mL premix (1,000 mg Intravenous New Bag/Given 09/08/16 1513)  sodium polystyrene (KAYEXALATE) 15 GM/60ML suspension 15 g (not administered)  0.9 %  sodium chloride infusion ( Intravenous New Bag/Given 09/08/16 1312)  sodium chloride 0.9 % bolus 1,000 mL (0 mLs Intravenous Stopped 09/08/16 1347)    And  sodium chloride 0.9 % bolus 1,000 mL (0 mLs Intravenous Stopped 09/08/16 1519)  piperacillin-tazobactam (ZOSYN) IVPB 3.375 g (0 g Intravenous Stopped 09/08/16 1509)     Initial Impression / Assessment and Plan / ED Course  I have reviewed the triage vital signs and the nursing notes.  Pertinent labs & imaging results  that were available during my care of the patient were reviewed by me and considered in my medical decision making (see chart for details).      Patient Vitals for the past 24 hrs:  BP Temp Temp src Pulse Resp SpO2 Height Weight  09/08/16 1520 117/68 98.1 F (36.7 C) Core 90 18 98 % - -  09/08/16 1500 140/63 97.9 F (36.6 C) - 82 15 98 % - -  09/08/16 1447 122/61 97.7 F (36.5 C) - - 19 - - -  09/08/16 1430 - 97.5 F (36.4 C) - - (!) 22 - - -  09/08/16 1330 (!) 97/56 - - - (!) 23 98 % - -  09/08/16 1315 (!) 91/55 97.5 F (36.4 C) Oral 89 18 97 % - -  09/08/16 1257 (!) 92/58 97.6 F (36.4 C) Oral 88 20 96 % - -  09/08/16 1230 (!) 91/59 - - 84 18 99 % - -  09/08/16 1200 (!) 80/58 - - 78 13 100 % - -  09/08/16 1130 99/73 - - - 12 - - -  09/08/16 1110 - (!) 94 F (34.4 C) Rectal - - - - -  09/08/16 1103 (!) 93/48 - - 80 19 93 % - -  09/08/16 1058 - - - - - - 5\' 2"  (1.575 m) 59 kg (130 lb)    1:36 PM Reevaluation with update and discussion. After initial assessment and treatment, an updated evaluation reveals no change in clinical status.  Patient and friend were updated on the findings. Mervin Ramires L    3:23 PM-Consult complete with hospitalist. Patient case explained and discussed.  He agrees to admit  patient for further evaluation and treatment. Call ended at 72: 74  CRITICAL CARE Performed by: Daleen Bo L Total critical care time: 45 minutes Critical care time was exclusive of separately billable procedures and treating other patients. Critical care was necessary to treat or prevent imminent or life-threatening deterioration. Critical care was time spent personally by me on the following activities: development of treatment plan with patient and/or surrogate as well as nursing, discussions with consultants, evaluation of patient's response to treatment, examination of patient, obtaining history from patient or surrogate, ordering and performing treatments and  interventions, ordering and review of laboratory studies, ordering and review of radiographic studies, pulse oximetry and re-evaluation of patient's condition.   Final Clinical Impressions(s) / ED Diagnoses   Final diagnoses:  Metastatic cancer ([place])  Gastrointestinal hemorrhage, unspecified gastrointestinal hemorrhage type  Hypothermia, initial encounter  Anemia, unspecified type  AKI (acute kidney injury) ([place])  Hyperkalemia  Prolonged Q-T interval on ECG   Left leg pain, secondary to known DVT.  Patient has not been anticoagulated because of ongoing gastrointestinal bleeding.  Hemoglobin is substantially lower today, then 2 days ago.  Patient being treated with transfusions.  Patient now with acute renal failure, creatinine 6, up from 4, 2 days ago.  Significant elevation of potassium, now hyperkalemic.  Incidental hypothermia, and hypotension.  Possible sepsis, with elevated lactate.  However this may be a nonspecific elevation because of necrotic mass in her stomach.  Patient is severely ill, worsening and has a very low chance of survival, from her current constellation of symptoms.  She will benefit from palliative consultation.  She has been treated with empiric antibiotics, for sepsis.  She will require hospitalization for stabilization.  Nursing Notes Reviewed/ Care Coordinated Applicable Imaging Reviewed Interpretation of Laboratory Data incorporated into ED treatment  Plan: Admit   New Prescriptions New Prescriptions   No medications on file     Daleen Bo, MD 09/08/16 1545

## 2016-09-08 NOTE — ED Notes (Signed)
Bair hugger removed due to patient's body temperature increased to 97.6.

## 2016-09-08 NOTE — ED Notes (Signed)
Bair hugger placed on patient and applied blankets x 2 for warming.

## 2016-09-08 NOTE — ED Notes (Signed)
CRITICAL VALUE ALERT  Critical Value:  Lactic Acid  - 4.1  Date & Time Notied:  09-08-2016 / 1335  Provider Notified: Dr Eulis Foster  Orders Received/Actions taken:

## 2016-09-08 NOTE — H&P (Signed)
History and Physical  Jodi Collins ZOX:[id number] DOB: 1950-11-26 DOA: 09/08/2016  Referring physician: Dr Eulis Foster, ED physician PCP: Glendale Chard, MD  Outpatient Specialists:   Dr Benson Norway (GI)  Dr Talbert Cage (oncology)  Patient Coming From: home  Chief Complaint: left leg swelling and pain  HPI: Jodi Collins is a 66 y.o. female who is recently diagnosed with a DVT of her left leg, ulnar embolism, anemia, and gastric cancer. After her recent diagnosis of left DVT on 08/07/16, the patient was placed on Zarelto, however she stopped it after a couple of days due to melena. She presented to the ER on 08/28/16 with shortness of breath, cough, and for the left lower extremity swelling and was found to have a right-sided segmental and subsegmental emboli, she was started on heparin, but this was discontinued after finding her acutely anemic with possible GI bleed. She then received a blood transfusion. Additionally, she did have a CT of her abdomen and pelvis with contrast at the time of her admission which showed gastric cancer, which was confirmed with EGD biopsy on 08/30/16. She did have a retrievable IVC filter placed by IR on 08/29/16. She did have follow-up blood work on 6/15 which showed a hemoglobin of 9.5. She met with her oncologist on 6/20, who ordered all of blood work which showed that she was anemic to 7.7. Blood transfusion was arranged for today, however she's began to have increasing left leg swelling with increasing pain. Patient was brought to the hospital by EMS. Pain increased with ambulation and standing. Improved with rest. Swelling is improved since laying in the hospital been with it being elevated. The patient denies any focal symptoms of infection, including fevers, chills, nausea, vomiting, diarrhea, productive cough, dysuria, frequency.  Emergency Department Course: On arrival, it was noted that her temperature was 94. Aberrant was put on. Initial blood work shows  white count 21.4, hemoglobin of 5.5, potassium 6.2, creatinine of 6.76. Lactic acid was 4.1 and now 2.7 on repeat. She was bolused with 30 L/kg of IV fluids and received project of antibiotics ((vancomycin and Zosyn). Initially her blood pressure was 80/58 - this is improved to 134/69 after her fluid boluses. . Review of Systems:   Pt denies any fevers, chills, nausea, vomiting, diarrhea, constipation, abdominal pain, shortness of breath, dyspnea on exertion, orthopnea, cough, wheezing, palpitations, headache, vision changes, lightheadedness, dizziness, melena, rectal bleeding.  Review of systems are otherwise negative  Past Medical History:  Diagnosis Date  . Anemia   . DVT (deep venous thrombosis) ([place])   . Stomach cancer [facility])    Past Surgical History:  Procedure Laterality Date  . CERVICAL CONIZATION W/BX  2012   high grade squamous intraepithelial dysplasia.  Dr Garwin Brothers.   . COLONOSCOPY W/ POLYPECTOMY  10/2006 and 10/2011   Dr Collene Mares.  had adenomatous polyps.    . ESOPHAGOGASTRODUODENOSCOPY N/A 08/30/2016   Procedure: ESOPHAGOGASTRODUODENOSCOPY (EGD);  Surgeon: Carol Ada, MD;  Location: [place];  Service: Gastroenterology;  Laterality: N/A;  . IR FLUORO GUIDE PORT INSERTION RIGHT  09/01/2016  . IR IVC FILTER PLMT / S&I /IMG GUID/MOD SED  08/29/2016  . IR US GUIDE VASC ACCESS RIGHT  09/01/2016   Social History:  reports that she has never smoked. She has never used smokeless tobacco. She reports that she drinks alcohol. She reports that she does not use drugs. Patient lives at Home  No Known Allergies  Family History  Problem Relation Age of Onset  . Hypertension Mother   .  Diabetes Mother       Prior to Admission medications   Medication Sig Start Date End Date Taking? Authorizing Provider  fluorouracil CALGB 82060 in sodium chloride 0.9 % 150 mL Inject into the vein over 96 hr. Every 2 weeks, wears pump for 46 hours   Yes [provider]  leucovorin in dextrose 5  % 250 mL Inject into the vein once. Every 2 weeks   Yes [provider]  lidocaine-prilocaine (EMLA) cream Apply to affected area once 09/07/16  Yes Twana First, MD  ondansetron (ZOFRAN) 8 MG tablet Take 1 tablet (8 mg total) by mouth 2 (two) times daily as needed for refractory nausea / vomiting. Start on day 3 after chemotherapy. 09/07/16  Yes Twana First, MD  oxyCODONE-acetaminophen (PERCOCET/ROXICET) 5-325 MG tablet Take 1-2 tablets by mouth every 4 (four) hours as needed for severe pain. 09/07/16  Yes Twana First, MD  prochlorperazine (COMPAZINE) 10 MG tablet Take 1 tablet (10 mg total) by mouth every 6 (six) hours as needed (Nausea or vomiting). 09/07/16  Yes Twana First, MD  dexamethasone (DECADRON) 4 MG tablet Take 2 tablets (8 mg total) by mouth daily. Start the day after chemotherapy for 2 days. Take with food. 09/07/16   Twana First, MD  OXALIPLATIN IV Inject into the vein. Every 2 weeks    [provider]    Physical Exam: BP 134/69 (BP Location: Left Arm)   Pulse 90   Temp 98.4 F (36.9 C) (Core (Comment))   Resp 20   Ht _0  (1.575 m)   Wt 59 kg (130 lb)   SpO2 98%   BMI 23.78 kg/m   General: Elderly black female who is thin. Awake and alert and oriented x3. No acute cardiopulmonary distress.  HEENT: Normocephalic atraumatic.  Right and left ears normal in appearance.  Pupils equal, round, reactive to light. Extraocular muscles are intact. Sclerae anicteric and noninjected.  Moist mucosal membranes. No mucosal lesions.  Neck: Neck supple without lymphadenopathy. No carotid bruits. No masses palpated.  Cardiovascular: Regular rate with normal S1-S2 sounds. No murmurs, rubs, gallops auscultated. No JVD.   Respiratory: Good respiratory effort with no wheezes, rales, rhonchi. Lungs clear to auscultation bilaterally.  No accessory muscle use. Abdomen: Soft, nontender, nondistended. Active bowel sounds. No masses or hepatosplenomegaly  Skin: 2+ pitting edema in  the thighs. She does have TED stockings on lower extremities. No erythema. No rashes, lesions, or ulcerations.  Dry, warm to touch. 2+ dorsalis pedis and radial pulses. Musculoskeletal: No calf or leg pain. All major joints not erythematous nontender.  No upper or lower joint deformation.  Good ROM.  No contractures  Psychiatric: Intact judgment and insight. Pleasant and cooperative. Neurologic: No focal neurological deficits. Strength is 5/5 and symmetric in upper and lower extremities.  Cranial nerves II through XII are grossly intact.  Sepsis - Repeat Assessment  Performed at:    1600  Vitals     Blood pressure 134/69, pulse 90, temperature 98.4 F (36.9 C), temperature source Core (Comment), resp. rate 20, height _1  (1.575 m), weight 59 kg (130 lb), SpO2 98 %.  Heart:     Regular rate and rhythm  Lungs:    CTA  Capillary Refill:   <2 sec  Peripheral Pulse:   Radial pulse palpable and Posterior tibialis pulse  palpable  Skin:     Normal Color             Labs on Admission: I have personally  reviewed following labs and imaging studies  CBC:  Recent Labs Lab 09/06/16 1410 09/08/16 1247  WBC 17.3* 21.4*  NEUTROABS 15.4* 18.9*  HGB 7.2* 5.5*  HCT 22.7* 17.5*  MCV 80.2 83.3  PLT 167 465   Basic Metabolic Panel:  Recent Labs Lab 09/06/16 1410 09/08/16 1247  NA 127* 130*  K 5.2* 6.2*  CL 97* 99*  CO2 14* 12*  GLUCOSE 130* 109*  BUN 70* 103*  CREATININE 4.16* 6.76*  CALCIUM 8.9 7.9*   GFR: Estimated Creatinine Clearance: 6.6 mL/min (A) (by C-G formula based on SCr of 6.76 mg/dL (H)). Liver Function Tests:  Recent Labs Lab 09/06/16 1410 09/08/16 1412  AST 37 41  ALT 24 23  ALKPHOS 78 77  BILITOT 0.3 0.3  PROT 7.1 6.1*  ALBUMIN 2.4* 2.2*   No results for input(s): LIPASE, AMYLASE in the last 168 hours. No results for input(s): AMMONIA in the last 168 hours. Coagulation Profile: No results for input(s): INR, PROTIME in the last 168 hours. Cardiac  Enzymes:  Recent Labs Lab 09/08/16 1247  TROPONINI <0.03   BNP (last 3 results) No results for input(s): PROBNP in the last 8760 hours. HbA1C: No results for input(s): HGBA1C in the last 72 hours. CBG: No results for input(s): GLUCAP in the last 168 hours. Lipid Profile: No results for input(s): CHOL, HDL, LDLCALC, TRIG, CHOLHDL, LDLDIRECT in the last 72 hours. Thyroid Function Tests: No results for input(s): TSH, T4TOTAL, FREET4, T3FREE, THYROIDAB in the last 72 hours. Anemia Panel:  Recent Labs  09/06/16 1410  FERRITIN 127  TIBC 256  IRON 28   Urine analysis:    Component Value Date/Time   COLORURINE AMBER (A) 09/08/2016 1340   APPEARANCEUR CLOUDY (A) 09/08/2016 1340   LABSPEC 1.023 09/08/2016 1340   PHURINE 5.0 09/08/2016 1340   GLUCOSEU 50 (A) 09/08/2016 1340   HGBUR MODERATE (A) 09/08/2016 1340   BILIRUBINUR SMALL (A) 09/08/2016 1340   KETONESUR NEGATIVE 09/08/2016 1340   PROTEINUR 30 (A) 09/08/2016 1340   NITRITE NEGATIVE 09/08/2016 1340   LEUKOCYTESUR NEGATIVE 09/08/2016 1340   Sepsis Labs: _0 (procalcitonin:4,lacticidven:4) )No results found for this or any previous visit (from the past 240 hour(s)).   Radiological Exams on Admission: Dg Chest Port 1 View  Result Date: 09/08/2016 CLINICAL DATA:  66 year old female with a history of leg swelling EXAM: PORTABLE CHEST 1 VIEW COMPARISON:  CT 08/28/2016, port catheter 09/01/2016 FINDINGS: Cardiomediastinal silhouette unchanged with left heart border partially obscured by overlying diaphragmatic hernia. 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 No pneumothorax. No large pleural effusion. No confluent airspace disease. Retrocardiac region not well evaluated. Asymmetric elevation of the left hemidiaphragm in this patient with known diaphragmatic hernia better demonstrated on prior CT. IMPRESSION: Re- demonstration of left-sided diaphragmatic hernia, with no definite  evidence of lobar pneumonia. 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 Electronically Signed   By: Corrie Mckusick D.O.   On: 09/08/2016 14:55    EKG: Independently reviewed. Sinus rhythm. No acute ST changes. Perhaps mildly peaked T's.  Assessment/Plan: Principal Problem:   Sepsis ([place]) Active Problems:   Pulmonary emboli ([facility])   Anemia   Gastric cancer ([facility])   Left leg DVT ([place])    This patient was discussed with the ED physician, including pertinent vitals, physical exam findings, labs, and imaging.  We also discussed care given by the ED provider.  #1 sepsis - unknown source  Admit to stepdown  I had a frank conversation with the patient regarding feasibility of full recovery. The patient has several problems including blood clots, hyperkalemia, renal failure in the setting of sepsis. Patient currently wants everything done.  Started on broad-spectrum antibiotics  Zosyn and vancomycin per pharmacy consult  Urine culture, blood culture obtained in the emergency department  Status post fluid bolus  Sepsis recheck documented above #2 anemia  Transfuse total of 3 units (patient received to the emergency department, will give 1 more on the floor)  We'll keep 2 units ahead  H&H following transfusion #3 hyperkalemia  Possibly secondary to tumor lysis, although the patient denies any abdominal pain  Kayexalate  Serum potassium every 4 hours  Telemetry monitoring #4 acute renal failure  Likely secondary to sepsis, hypoperfusion and anemia  Transfuse, fluid boluses given  Recheck creatinine later tonight and tomorrow morning #5 gastric cancer   #6 left leg DVT  Unable to tolerate anticoagulation secondary to GI bleeding  DVT prophylaxis: SCDs Consultants: None Code Status: Full code Family Communication: Not available  Disposition Plan: Admission to stepdown   Truett Mainland, DO [facility] Pager  [phone]  If 7PM-7AM, please contact night-coverage [email or web address] Password [email or web address]

## 2016-09-08 NOTE — Progress Notes (Addendum)
Pharmacy Note:  Initial antibiotics for Vancomycin and Zosyn ordered by EDP for Sepsis.  Estimated Creatinine Clearance: 6.6 mL/min (A) (by C-G formula based on SCr of 6.76 mg/dL (H)).   No Known Allergies  Vitals:   09/08/16 1315 09/08/16 1330  BP: (!) 91/55 (!) 97/56  Pulse: 89   Resp: 18 (!) 23  Temp: 97.5 F (36.4 C)     Anti-infectives    Start     Dose/Rate Route Frequency Ordered Stop   09/08/16 1400  vancomycin (VANCOCIN) IVPB 1000 mg/200 mL premix     1,000 mg 200 mL/hr over 60 Minutes Intravenous  Once 09/08/16 1355     09/08/16 1400  piperacillin-tazobactam (ZOSYN) IVPB 3.375 g     3.375 g 100 mL/hr over 30 Minutes Intravenous  Once 09/08/16 1355       Plan: Initial doses of Vancomycin and Zosyn X 1 ordered. F/U admission orders for further dosing if therapy continued.  Pricilla Larsson, [facility] 09/08/2016 1:56 PM  Cont zosyn 2.25 gm IV q8 hours F/u renal function to reorder vanc dose Excell Seltzer, PharmD

## 2016-09-08 NOTE — ED Triage Notes (Signed)
Patient brought in by EMS for complaint of left leg swelling x 2 days. States she was supposed to go to cancer center for blood transfusion today but was unable to go due to leg swelling.

## 2016-09-08 NOTE — ED Notes (Signed)
CRITICAL VALUE ALERT  Critical Value:  Lactic Acid - 2.7  Date & Time Notied:  09/08/2016 - 1550  Provider Notified: Dr Nehemiah Settle  Orders Received/Actions taken:

## 2016-09-09 DIAGNOSIS — D649 Anemia, unspecified: Secondary | ICD-10-CM

## 2016-09-09 DIAGNOSIS — N179 Acute kidney failure, unspecified: Secondary | ICD-10-CM

## 2016-09-09 DIAGNOSIS — C169 Malignant neoplasm of stomach, unspecified: Secondary | ICD-10-CM

## 2016-09-09 DIAGNOSIS — E875 Hyperkalemia: Secondary | ICD-10-CM

## 2016-09-09 DIAGNOSIS — A419 Sepsis, unspecified organism: Principal | ICD-10-CM

## 2016-09-09 LAB — BPAM RBC
BLOOD PRODUCT EXPIRATION DATE: 201807102359
Blood Product Expiration Date: 201806232359
Blood Product Expiration Date: 201806252359
ISSUE DATE / TIME: 201806221530
ISSUE DATE / TIME: 201806221245
ISSUE DATE / TIME: 201806221843
UNIT TYPE AND RH: 6200
UNIT TYPE AND RH: 6200
Unit Type and Rh: 6200

## 2016-09-09 LAB — TYPE AND SCREEN
ABO/RH(D): A POS
ANTIBODY SCREEN: POSITIVE
DAT, IGG: NEGATIVE
UNIT DIVISION: 0
Unit division: 0
Unit division: 0

## 2016-09-09 LAB — BASIC METABOLIC PANEL
Anion gap: 12 (ref 5–15)
BUN: 91 mg/dL — AB (ref 6–20)
CALCIUM: 7.8 mg/dL — AB (ref 8.9–10.3)
CO2: 14 mmol/L — ABNORMAL LOW (ref 22–32)
CREATININE: 3.93 mg/dL — AB (ref 0.44–1.00)
Chloride: 105 mmol/L (ref 101–111)
GFR calc Af Amer: 13 mL/min — ABNORMAL LOW (ref >60)
GFR, EST NON AFRICAN AMERICAN: 11 mL/min — AB (ref >60)
Glucose, Bld: 111 mg/dL — ABNORMAL HIGH (ref 65–99)
POTASSIUM: 4.9 mmol/L (ref 3.5–5.1)
SODIUM: 131 mmol/L — AB (ref 135–145)

## 2016-09-09 LAB — POTASSIUM
POTASSIUM: 5.3 mmol/L — AB (ref 3.5–5.1)
POTASSIUM: 5.2 mmol/L — AB (ref 3.5–5.1)
Potassium: 5.3 mmol/L — ABNORMAL HIGH (ref 3.5–5.1)
Potassium: 5.1 mmol/L (ref 3.5–5.1)
Potassium: 5.2 mmol/L — ABNORMAL HIGH (ref 3.5–5.1)

## 2016-09-09 LAB — CBC
HEMATOCRIT: 29.1 % — AB (ref 36.0–46.0)
Hemoglobin: 10 g/dL — ABNORMAL LOW (ref 12.0–15.0)
MCH: 27.5 pg (ref 26.0–34.0)
MCHC: 34.4 g/dL (ref 30.0–36.0)
MCV: 79.9 fL (ref 78.0–100.0)
PLATELETS: 103 10*3/uL — AB (ref 150–400)
RBC: 3.64 MIL/uL — ABNORMAL LOW (ref 3.87–5.11)
RDW: 16.2 % — AB (ref 11.5–15.5)
WBC: 18.2 10*3/uL — AB (ref 4.0–10.5)

## 2016-09-09 LAB — OSMOLALITY, URINE: OSMOLALITY UR: 384 mosm/kg (ref 300–900)

## 2016-09-09 MED ORDER — VANCOMYCIN HCL IN DEXTROSE 1-5 GM/200ML-% IV SOLN
1000.0000 mg | INTRAVENOUS | Status: DC
  Administered 2016-09-10: 1000 mg via INTRAVENOUS
  Filled 2016-09-09: qty 200

## 2016-09-09 MED ORDER — LACTULOSE 10 GM/15ML PO SOLN
10.0000 g | Freq: Two times a day (BID) | ORAL | Status: DC | PRN
  Administered 2016-09-10: 10 g via ORAL
  Filled 2016-09-09 (×2): qty 30

## 2016-09-09 MED ORDER — OXYCODONE-ACETAMINOPHEN 5-325 MG PO TABS
1.0000 | ORAL_TABLET | ORAL | Status: DC | PRN
Start: 2016-09-09 — End: 2016-09-11
  Administered 2016-09-10 (×2): 1 via ORAL
  Filled 2016-09-09 (×2): qty 1

## 2016-09-09 MED ORDER — SODIUM POLYSTYRENE SULFONATE 15 GM/60ML PO SUSP
15.0000 g | Freq: Once | ORAL | Status: AC
  Administered 2016-09-09: 15 g via ORAL
  Filled 2016-09-09: qty 60

## 2016-09-09 MED ORDER — SENNOSIDES-DOCUSATE SODIUM 8.6-50 MG PO TABS
1.0000 | ORAL_TABLET | Freq: Two times a day (BID) | ORAL | Status: DC
  Administered 2016-09-09 – 2016-09-10 (×3): 1 via ORAL
  Filled 2016-09-09 (×4): qty 1

## 2016-09-09 NOTE — Plan of Care (Signed)
Problem: Safety: Goal: Ability to remain free from injury will improve Bed in lowest position, side rails up, call bell and personal items within reach

## 2016-09-09 NOTE — Progress Notes (Signed)
CRITICAL VALUE ALERT  Critical Value:  K+ 6.3 Date & Time Notied:  09/08/16 2000  Provider Notified: Olevia Bowens  Orders Received/Actions taken: new orders for LR bolus, dextrose, insulin, kayexalate and calcium

## 2016-09-09 NOTE — Progress Notes (Addendum)
Patient ID: Jodi Collins, female   DOB: Jul 29, 1950, 66 y.o.   MRN: [id number]  PROGRESS NOTE    Jodi Collins  KPV:[id number] DOB: 07/02/1950 DOA: 09/08/2016  PCP: Glendale Chard, MD   Brief Narrative:  66 year old female with history of DVT in LLE dx in 07/2016, PE diagnosed on recent admission in 08/2016 was on xarelto but this was stopped due to GI bleed, now status post IVC filter placement on 08/29/2016. Pt also has history of gastric cancer confirmed on EGD 08/31/2106. She was supposed to have blood transfusion on the day of admission but due to worsening swelling in the lower extremity she was sent to ED for evaluation. Blood work on admission showed WBC Count 21.4, hemoglobin 5.5, potassium 6.2, creatinine 6.76 and lactic acid 4.1. She was given empiric vanco and zosyn    Assessment & Plan:   Principal Problem:   Sepsis ([place]) / UTI / Leukocytosis  - Sepsis criteria met on admission likely due to UTI - Urine cx pending  - WBC count better this am - Lactic acid now WNL - Continue vanco and zosyn   Active Problems:   Pulmonary emboli (HCC) / DVT - Not on AC due to GI bleed - Has had IVC filter placed 08/30/2106    Anemia of chronic disease / Acute blood loss anemia - S/p 3 U PRBC transfusion - Hgb 10 this am    Acute renal failure superimposed on CKD stage 3 - Cr 6/20 4.16 and on 6/11 it was 1.14 - Cr on this admission 6.76, likely due to sepsis - Cr has improved since admission to 3.93, likely with IV fluids     Hyperkalemia - Due to sepsis - Resolved with kayexalate    Gastric cancer ([place]) - Management per oncology   DVT prophylaxis: SCD's Code Status: full code  Family Communication: no family at the bedside this am Disposition Plan: remains in SDU for at least next 24 hours    Consultants:   None  Procedures:   None   Antimicrobials:   Vanco and zosyn  6/22 -->   Subjective: No overnight events.  Objective: Vitals:   09/09/16  0700 09/09/16 0709 09/09/16 0800 09/09/16 0900  BP: (!) 93/54  (!) 91/59 96/75  Pulse: 89 86 88 91  Resp: (!) 22 (!) 26 18 (!) 22  Temp: 99 F (37.2 C) 99 F (37.2 C) 99.1 F (37.3 C) 99.1 F (37.3 C)  TempSrc:      SpO2: 97% 95% 96% 98%  Weight:      Height:        Intake/Output Summary (Last 24 hours) at 09/09/16 0920 Last data filed at 09/09/16 6754  Gross per 24 hour  Intake          3418.67 ml  Output              800 ml  Net          2618.67 ml   Filed Weights   09/08/16 1058 09/08/16 1747  Weight: 59 kg (130 lb) 63.7 kg (140 lb 6.9 oz)    Examination:  General exam: Appears calm and comfortable  Respiratory system: Clear to auscultation. Respiratory effort normal. Cardiovascular system: S1 & S2 heard, RRR. No JVD, murmurs, rubs, gallops or clicks. No pedal edema. Gastrointestinal system: Abdomen is nondistended, soft and nontender. No organomegaly or masses felt. Normal bowel sounds heard. Central nervous system: Alert and oriented. No focal neurological deficits. Extremities: Symmetric 5 x  5 power. Skin: LE pitting edema +1-2, palpable pulses  Psychiatry: Judgement and insight appear normal. Mood & affect appropriate.   Data Reviewed: I have personally reviewed following labs and imaging studies  CBC:  Recent Labs Lab 09/06/16 1410 09/08/16 1247 09/09/16 0642  WBC 17.3* 21.4* 18.2*  NEUTROABS 15.4* 18.9*  --   HGB 7.2* 5.5* 10.0*  HCT 22.7* 17.5* 29.1*  MCV 80.2 83.3 79.9  PLT 167 159 607*   Basic Metabolic Panel:  Recent Labs Lab 09/06/16 1410 09/08/16 1247 09/08/16 1834 09/08/16 2313 09/09/16 0245 09/09/16 0642  NA 127* 130* 131*  --   --  131*  K 5.2* 6.2* 6.3* 5.3* 5.3* 4.9  CL 97* 99* 101  --   --  105  CO2 14* 12* 12*  --   --  14*  GLUCOSE 130* 109* 103*  --   --  111*  BUN 70* 103* 100*  --   --  91*  CREATININE 4.16* 6.76* 5.98*  --   --  3.93*  CALCIUM 8.9 7.9* 7.9*  --   --  7.8*   GFR: Estimated Creatinine Clearance: 12.7  mL/min (A) (by C-G formula based on SCr of 3.93 mg/dL (H)). Liver Function Tests:  Recent Labs Lab 09/06/16 1410 09/08/16 1412  AST 37 41  ALT 24 23  ALKPHOS 78 77  BILITOT 0.3 0.3  PROT 7.1 6.1*  ALBUMIN 2.4* 2.2*   No results for input(s): LIPASE, AMYLASE in the last 168 hours. No results for input(s): AMMONIA in the last 168 hours. Coagulation Profile: No results for input(s): INR, PROTIME in the last 168 hours. Cardiac Enzymes:  Recent Labs Lab 09/08/16 1247  TROPONINI <0.03   BNP (last 3 results) No results for input(s): PROBNP in the last 8760 hours. HbA1C: No results for input(s): HGBA1C in the last 72 hours. CBG: No results for input(s): GLUCAP in the last 168 hours. Lipid Profile: No results for input(s): CHOL, HDL, LDLCALC, TRIG, CHOLHDL, LDLDIRECT in the last 72 hours. Thyroid Function Tests: No results for input(s): TSH, T4TOTAL, FREET4, T3FREE, THYROIDAB in the last 72 hours. Anemia Panel:  Recent Labs  09/06/16 1410  FERRITIN 127  TIBC 256  IRON 28   Urine analysis:    Component Value Date/Time   COLORURINE AMBER (A) 09/08/2016 1340   APPEARANCEUR CLOUDY (A) 09/08/2016 1340   LABSPEC 1.023 09/08/2016 1340   PHURINE 5.0 09/08/2016 1340   GLUCOSEU 50 (A) 09/08/2016 1340   HGBUR MODERATE (A) 09/08/2016 1340   BILIRUBINUR SMALL (A) 09/08/2016 1340   KETONESUR NEGATIVE 09/08/2016 1340   PROTEINUR 30 (A) 09/08/2016 1340   NITRITE NEGATIVE 09/08/2016 1340   LEUKOCYTESUR NEGATIVE 09/08/2016 1340   Sepsis Labs: @LABRCNTIP (procalcitonin:4,lacticidven:4)   Blood Culture (routine x 2)     Status: None (Preliminary result)   Collection Time: 09/08/16  2:12 PM  Result Value Ref Range Status   Specimen Description BLOOD LEFT HAND  Final   Special Requests   Final    Blood Culture results may not be optimal due to an inadequate volume of blood received in culture bottles   Culture PENDING  Incomplete   Report Status PENDING  Incomplete  Blood  Culture (routine x 2)     Status: None (Preliminary result)   Collection Time: 09/08/16  2:23 PM  Result Value Ref Range Status   Specimen Description BLOOD RIGHT HAND  Final   Special Requests   Final    Blood Culture results may not  be optimal due to an inadequate volume of blood received in culture bottles   Culture PENDING  Incomplete   Report Status PENDING  Incomplete      Radiology Studies: Dg Chest Port 1 View Result Date: 09/08/2016  Re- demonstration of left-sided diaphragmatic hernia, with no definite evidence of lobar pneumonia. 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     Scheduled Meds: . sodium polystyrene  15 g Oral Q6H   Continuous Infusions: . sodium chloride 125 mL/hr at 09/08/16 2130  . sodium chloride    . piperacillin-tazobactam (ZOSYN) IVPB 2.25 g (09/09/16 0940)  . [START ON 09/10/2016] vancomycin       LOS: 1 day    Time spent: 25 minutes Greater than 50% of the time spent on counseling and coordinating the care.   Leisa Lenz, MD [facility] Pager [phone]  If 7PM-7AM, please contact night-coverage [email or web address] Password [email or web address] 09/09/2016, 9:20 AM

## 2016-09-10 LAB — CBC
HEMATOCRIT: 29.1 % — AB (ref 36.0–46.0)
Hemoglobin: 9.6 g/dL — ABNORMAL LOW (ref 12.0–15.0)
MCH: 27.2 pg (ref 26.0–34.0)
MCHC: 33 g/dL (ref 30.0–36.0)
MCV: 82.4 fL (ref 78.0–100.0)
Platelets: 95 10*3/uL — ABNORMAL LOW (ref 150–400)
RBC: 3.53 MIL/uL — ABNORMAL LOW (ref 3.87–5.11)
RDW: 17.5 % — AB (ref 11.5–15.5)
WBC: 18.2 10*3/uL — AB (ref 4.0–10.5)

## 2016-09-10 LAB — BASIC METABOLIC PANEL
ANION GAP: 12 (ref 5–15)
BUN: 79 mg/dL — ABNORMAL HIGH (ref 6–20)
CALCIUM: 8.1 mg/dL — AB (ref 8.9–10.3)
CO2: 15 mmol/L — AB (ref 22–32)
Chloride: 108 mmol/L (ref 101–111)
Creatinine, Ser: 2.57 mg/dL — ABNORMAL HIGH (ref 0.44–1.00)
GFR calc Af Amer: 22 mL/min — ABNORMAL LOW (ref >60)
GFR calc non Af Amer: 19 mL/min — ABNORMAL LOW (ref >60)
Glucose, Bld: 126 mg/dL — ABNORMAL HIGH (ref 65–99)
Potassium: 4.6 mmol/L (ref 3.5–5.1)
Sodium: 135 mmol/L (ref 135–145)

## 2016-09-10 LAB — URINE CULTURE: Culture: NO GROWTH

## 2016-09-10 LAB — MAGNESIUM: Magnesium: 2.3 mg/dL (ref 1.7–2.4)

## 2016-09-10 LAB — POTASSIUM: Potassium: 5 mmol/L (ref 3.5–5.1)

## 2016-09-10 MED ORDER — SODIUM CHLORIDE 0.9 % IV SOLN
250.0000 mL | Freq: Once | INTRAVENOUS | Status: AC
  Administered 2016-09-10: 250 mL via INTRAVENOUS

## 2016-09-10 MED ORDER — HEPARIN SOD (PORK) LOCK FLUSH 100 UNIT/ML IV SOLN: 500.0000 [IU] | Freq: Every day | INTRAVENOUS | Status: DC | PRN

## 2016-09-10 MED ORDER — SODIUM CHLORIDE 0.9% FLUSH: 3.0000 mL | INTRAVENOUS | Status: DC | PRN

## 2016-09-10 MED ORDER — SODIUM CHLORIDE 0.9% FLUSH: 10.0000 mL | INTRAVENOUS | Status: DC | PRN

## 2016-09-10 NOTE — Progress Notes (Signed)
Patient alert and oriented. Vital signs are stable. IV patent. No complaints of any distress. Report called to Amy, RN. Patient to be transferred to room 328 via wheelchair with nursing staff.

## 2016-09-10 NOTE — Progress Notes (Addendum)
Patient ID: Jodi Collins, female   DOB: May 14, 1950, 66 y.o.   MRN: [id number]  PROGRESS NOTE    Jodi Collins  MPN:[id number] DOB: 1950-07-18 DOA: 09/08/2016  PCP: Glendale Chard, MD   Brief Narrative:  66 year old female with history of DVT in LLE dx in 07/2016, PE diagnosed on recent admission in 08/2016 was on xarelto but this was stopped due to GI bleed, now status post IVC filter placement on 08/29/2016. Pt also has history of gastric cancer confirmed on EGD 08/31/2106. She was supposed to have blood transfusion on the day of admission but due to worsening swelling in the lower extremity she was sent to ED for evaluation. Blood work on admission showed WBC Count 21.4, hemoglobin 5.5, potassium 6.2, creatinine 6.76 and lactic acid 4.1. She was given empiric vanco and zosyn    Assessment & Plan:   Principal Problem:   Sepsis ([place]) / UTI / Leukocytosis  - Sepsis criteria met on admission and source thought to be UTI - Blood cx negative so far - Urine cx pending - Stop vanco and continue zosyn - Transfer to telemetry floor today  - Lactic acid WNL   Active Problems:   Pulmonary emboli ([facility]) / DVT - Not on AC due to GI bleed - Has had IVC filter placed 08/30/2106    Anemia of chronic disease / Acute blood loss anemia - S/p 3 U PRBC transfusion - Hemoglobin remains stable     Acute renal failure superimposed on CKD stage 3 - Cr 6/20 4.16 and on 6/11 it was 1.14 - Cr 6.76 on admission, elevated due to sepsis - Cr has improved since admission with IV fluids     Hyperkalemia - Due to sepsis  - Resolved with kayexalate     Gastric cancer ([place]) - Per oncology    DVT prophylaxis: SCD's Code Status: full code  Family Communication: family not at bedside this am Disposition Plan: transfer to telemetry floor today, home likely in am   Consultants:   None  Procedures:   None  Antimicrobials:   Vanco 6/22 --> 6/24  Zosyn 6/22  -->    Subjective: Feels better.  Objective: Vitals:   09/10/16 0700 09/10/16 0742 09/10/16 0800 09/10/16 0900  BP: 109/76  115/77 107/71  Pulse: 90  92 88  Resp: 17  18 (!) 23  Temp:  97.6 F (36.4 C)    TempSrc:  Oral    SpO2: 96%  97% 93%  Weight:      Height:        Intake/Output Summary (Last 24 hours) at 09/10/16 0956 Last data filed at 09/10/16 0600  Gross per 24 hour  Intake             3150 ml  Output             1950 ml  Net             1200 ml   Filed Weights   09/08/16 1058 09/08/16 1747  Weight: 59 kg (130 lb) 63.7 kg (140 lb 6.9 oz)    Examination:  Physical Exam  Constitutional: Appears well-developed and well-nourished. No distress.  Neck: Normal ROM. Neck supple. No JVD. No tracheal deviation. No thyromegaly.  CVS: RRR, S1/S2 + Pulmonary: Effort and breath sounds normal, no stridor Abdominal: Soft. BS +,  no distension, tenderness, rebound or guarding.  Musculoskeletal: Normal range of motion. LE edema in LLE +1 Lymphadenopathy: No lymphadenopathy noted, cervical, inguinal. Neuro: Alert.  Normal reflexes, muscle tone coordination. No cranial nerve deficit. Skin: Skin is warm and dry. No rash noted.  Psychiatric: Normal mood and affect. Behavior, judgment, thought content normal.     Data Reviewed: I have personally reviewed following labs and imaging studies  CBC:  Recent Labs Lab 09/06/16 1410 09/08/16 1247 09/09/16 0642 09/10/16 0536  WBC 17.3* 21.4* 18.2* 18.2*  NEUTROABS 15.4* 18.9*  --   --   HGB 7.2* 5.5* 10.0* 9.6*  HCT 22.7* 17.5* 29.1* 29.1*  MCV 80.2 83.3 79.9 82.4  PLT 167 159 103* 95*   Basic Metabolic Panel:  Recent Labs Lab 09/06/16 1410 09/08/16 1247 09/08/16 1834  09/09/16 0642 09/09/16 1109 09/09/16 1649 09/09/16 2148 09/10/16 0156 09/10/16 0536  NA 127* 130* 131*  --  131*  --   --   --   --  135  K 5.2* 6.2* 6.3*  < > 4.9 5.2* 5.1 5.2* 5.0 4.6  CL 97* 99* 101  --  105  --   --   --   --  108  CO2 14*  12* 12*  --  14*  --   --   --   --  15*  GLUCOSE 130* 109* 103*  --  111*  --   --   --   --  126*  BUN 70* 103* 100*  --  91*  --   --   --   --  79*  CREATININE 4.16* 6.76* 5.98*  --  3.93*  --   --   --   --  2.57*  CALCIUM 8.9 7.9* 7.9*  --  7.8*  --   --   --   --  8.1*  MG  --   --   --   --   --   --   --   --   --  2.3  < > = values in this interval not displayed. GFR: Estimated Creatinine Clearance: 19.4 mL/min (A) (by C-G formula based on SCr of 2.57 mg/dL (H)). Liver Function Tests:  Recent Labs Lab 09/06/16 1410 09/08/16 1412  AST 37 41  ALT 24 23  ALKPHOS 78 77  BILITOT 0.3 0.3  PROT 7.1 6.1*  ALBUMIN 2.4* 2.2*   No results for input(s): LIPASE, AMYLASE in the last 168 hours. No results for input(s): AMMONIA in the last 168 hours. Coagulation Profile: No results for input(s): INR, PROTIME in the last 168 hours. Cardiac Enzymes:  Recent Labs Lab 09/08/16 1247  TROPONINI <0.03   BNP (last 3 results) No results for input(s): PROBNP in the last 8760 hours. HbA1C: No results for input(s): HGBA1C in the last 72 hours. CBG: No results for input(s): GLUCAP in the last 168 hours. Lipid Profile: No results for input(s): CHOL, HDL, LDLCALC, TRIG, CHOLHDL, LDLDIRECT in the last 72 hours. Thyroid Function Tests: No results for input(s): TSH, T4TOTAL, FREET4, T3FREE, THYROIDAB in the last 72 hours. Anemia Panel: No results for input(s): VITAMINB12, FOLATE, FERRITIN, TIBC, IRON, RETICCTPCT in the last 72 hours. Urine analysis:    Component Value Date/Time   COLORURINE AMBER (A) 09/08/2016 1340   APPEARANCEUR CLOUDY (A) 09/08/2016 1340   LABSPEC 1.023 09/08/2016 1340   PHURINE 5.0 09/08/2016 1340   GLUCOSEU 50 (A) 09/08/2016 1340   HGBUR MODERATE (A) 09/08/2016 1340   BILIRUBINUR SMALL (A) 09/08/2016 1340   KETONESUR NEGATIVE 09/08/2016 1340   PROTEINUR 30 (A) 09/08/2016 1340   NITRITE NEGATIVE 09/08/2016 1340   LEUKOCYTESUR  NEGATIVE 09/08/2016 1340   Sepsis  Labs: _0 (procalcitonin:4,lacticidven:4)   Blood Culture (routine x 2)     Status: None (Preliminary result)   Collection Time: 09/08/16  2:12 PM  Result Value Ref Range Status   Specimen Description BLOOD LEFT HAND  Final   Special Requests   Final    Blood Culture results may not be optimal due to an inadequate volume of blood received in culture bottles   Culture PENDING  Incomplete   Report Status PENDING  Incomplete  Blood Culture (routine x 2)     Status: None (Preliminary result)   Collection Time: 09/08/16  2:23 PM  Result Value Ref Range Status   Specimen Description BLOOD RIGHT HAND  Final   Special Requests   Final    Blood Culture results may not be optimal due to an inadequate volume of blood received in culture bottles   Culture PENDING  Incomplete   Report Status PENDING  Incomplete      Radiology Studies: Dg Chest Port 1 View Result Date: 09/08/2016  Re- demonstration of left-sided diaphragmatic hernia, with no definite evidence of lobar pneumonia. 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     Scheduled Meds: . senna-docusate  1 tablet Oral BID   Continuous Infusions: . sodium chloride 125 mL/hr at 09/10/16 0532  . sodium chloride    . piperacillin-tazobactam (ZOSYN) IVPB 2.25 g (09/10/16 0517)  . vancomycin 1,000 mg (09/10/16 0900)     LOS: 2 days    Time spent: 25 minutes Greater than 50% of the time spent on counseling and coordinating the care.   Leisa Lenz, MD [facility] Pager [phone]  If 7PM-7AM, please contact night-coverage [email or web address] Password [facility] 09/10/2016, 9:56 AM

## 2016-09-11 ENCOUNTER — Emergency Department (HOSPITAL_COMMUNITY)
Admission: EM | Admit: 2016-09-11 | Discharge: 2016-09-11 | Disposition: A | Discharge status: Home / Self Care | Payer: 59 | Attending: Emergency Medicine | Admitting: Emergency Medicine

## 2016-09-11 ENCOUNTER — Ambulatory Visit (HOSPITAL_COMMUNITY): Payer: 59

## 2016-09-11 ENCOUNTER — Encounter (HOSPITAL_COMMUNITY): Payer: Self-pay | Admitting: Emergency Medicine

## 2016-09-11 ENCOUNTER — Inpatient Hospital Stay (HOSPITAL_COMMUNITY): Payer: 59

## 2016-09-11 ENCOUNTER — Encounter (HOSPITAL_COMMUNITY): Payer: Self-pay

## 2016-09-11 ENCOUNTER — Other Ambulatory Visit (HOSPITAL_COMMUNITY): Payer: Self-pay | Admitting: Pharmacist

## 2016-09-11 DIAGNOSIS — D649 Anemia, unspecified: Secondary | ICD-10-CM | POA: Insufficient documentation

## 2016-09-11 DIAGNOSIS — C169 Malignant neoplasm of stomach, unspecified: Secondary | ICD-10-CM | POA: Insufficient documentation

## 2016-09-11 DIAGNOSIS — C168 Malignant neoplasm of overlapping sites of stomach: Secondary | ICD-10-CM

## 2016-09-11 DIAGNOSIS — R230 Cyanosis: Secondary | ICD-10-CM

## 2016-09-11 DIAGNOSIS — I82502 Chronic embolism and thrombosis of unspecified deep veins of left lower extremity: Secondary | ICD-10-CM | POA: Insufficient documentation

## 2016-09-11 LAB — CBC WITH DIFFERENTIAL/PLATELET
BASOS ABS: 0 10*3/uL (ref 0.0–0.1)
BASOS PCT: 0 %
Eosinophils Absolute: 0 10*3/uL (ref 0.0–0.7)
Eosinophils Relative: 0 %
HEMATOCRIT: 24 % — AB (ref 36.0–46.0)
HEMOGLOBIN: 8 g/dL — AB (ref 12.0–15.0)
LYMPHS PCT: 6 %
Lymphs Abs: 1.2 10*3/uL (ref 0.7–4.0)
MCH: 27.8 pg (ref 26.0–34.0)
MCHC: 33.3 g/dL (ref 30.0–36.0)
MCV: 83.3 fL (ref 78.0–100.0)
MONO ABS: 1 10*3/uL (ref 0.1–1.0)
Monocytes Relative: 5 %
NEUTROS ABS: 16.9 10*3/uL — AB (ref 1.7–7.7)
NEUTROS PCT: 88 %
Platelets: 109 10*3/uL — ABNORMAL LOW (ref 150–400)
RBC: 2.88 MIL/uL — AB (ref 3.87–5.11)
RDW: 18.4 % — ABNORMAL HIGH (ref 11.5–15.5)
WBC: 19.2 10*3/uL — ABNORMAL HIGH (ref 4.0–10.5)

## 2016-09-11 LAB — COMPREHENSIVE METABOLIC PANEL
ALBUMIN: 1.8 g/dL — AB (ref 3.5–5.0)
ALK PHOS: 85 U/L (ref 38–126)
ALT: 44 U/L (ref 14–54)
ANION GAP: 9 (ref 5–15)
AST: 57 U/L — ABNORMAL HIGH (ref 15–41)
BILIRUBIN TOTAL: 1 mg/dL (ref 0.3–1.2)
BUN: 50 mg/dL — ABNORMAL HIGH (ref 6–20)
CALCIUM: 8 mg/dL — AB (ref 8.9–10.3)
CO2: 16 mmol/L — AB (ref 22–32)
Chloride: 107 mmol/L (ref 101–111)
Creatinine, Ser: 1.24 mg/dL — ABNORMAL HIGH (ref 0.44–1.00)
GFR calc Af Amer: 52 mL/min — ABNORMAL LOW (ref >60)
GFR calc non Af Amer: 45 mL/min — ABNORMAL LOW (ref >60)
Glucose, Bld: 121 mg/dL — ABNORMAL HIGH (ref 65–99)
Potassium: 4.1 mmol/L (ref 3.5–5.1)
SODIUM: 132 mmol/L — AB (ref 135–145)
TOTAL PROTEIN: 6.1 g/dL — AB (ref 6.5–8.1)

## 2016-09-11 LAB — CBC
HCT: 26.2 % — ABNORMAL LOW (ref 36.0–46.0)
HEMOGLOBIN: 8.6 g/dL — AB (ref 12.0–15.0)
MCH: 27.4 pg (ref 26.0–34.0)
MCHC: 32.8 g/dL (ref 30.0–36.0)
MCV: 83.4 fL (ref 78.0–100.0)
PLATELETS: 101 10*3/uL — AB (ref 150–400)
RBC: 3.14 MIL/uL — ABNORMAL LOW (ref 3.87–5.11)
RDW: 18.2 % — ABNORMAL HIGH (ref 11.5–15.5)
WBC: 18.3 10*3/uL — AB (ref 4.0–10.5)

## 2016-09-11 LAB — BASIC METABOLIC PANEL
ANION GAP: 9 (ref 5–15)
BUN: 51 mg/dL — ABNORMAL HIGH (ref 6–20)
CHLORIDE: 111 mmol/L (ref 101–111)
CO2: 15 mmol/L — AB (ref 22–32)
CREATININE: 1.35 mg/dL — AB (ref 0.44–1.00)
Calcium: 7.9 mg/dL — ABNORMAL LOW (ref 8.9–10.3)
GFR calc non Af Amer: 41 mL/min — ABNORMAL LOW (ref >60)
GFR, EST AFRICAN AMERICAN: 47 mL/min — AB (ref >60)
Glucose, Bld: 117 mg/dL — ABNORMAL HIGH (ref 65–99)
POTASSIUM: 4.2 mmol/L (ref 3.5–5.1)
SODIUM: 135 mmol/L (ref 135–145)

## 2016-09-11 LAB — PROTIME-INR
INR: 1.47
Prothrombin Time: 18 seconds — ABNORMAL HIGH (ref 11.4–15.2)

## 2016-09-11 LAB — APTT: APTT: 41 s — AB (ref 24–36)

## 2016-09-11 MED ORDER — DEXAMETHASONE 4 MG PO TABS: 8.0000 mg | ORAL_TABLET | Freq: Every day | ORAL | 1 refills | Status: AC

## 2016-09-11 MED ORDER — PIPERACILLIN-TAZOBACTAM 3.375 G IVPB
3.3750 g | Freq: Three times a day (TID) | INTRAVENOUS | Status: DC
Start: 2016-09-11 — End: 2016-09-11

## 2016-09-11 MED ORDER — SENNA 8.6 MG PO TABS
1.0000 | ORAL_TABLET | Freq: Every day | ORAL | Status: DC
  Administered 2016-09-11: 8.6 mg via ORAL
  Filled 2016-09-11: qty 1

## 2016-09-11 MED ORDER — HEPARIN SOD (PORK) LOCK FLUSH 100 UNIT/ML IV SOLN
500.0000 [IU] | Freq: Once | INTRAVENOUS | Status: AC
  Administered 2016-09-11: 500 [IU]
  Filled 2016-09-11: qty 5

## 2016-09-11 MED ORDER — POLYETHYLENE GLYCOL 3350 17 G PO PACK
17.0000 g | PACK | Freq: Every day | ORAL | Status: DC
  Administered 2016-09-11: 17 g via ORAL
  Filled 2016-09-11: qty 1

## 2016-09-11 MED ORDER — CIPROFLOXACIN HCL 500 MG PO TABS: 500.0000 mg | ORAL_TABLET | Freq: Two times a day (BID) | ORAL | 0 refills | Status: AC

## 2016-09-11 MED ORDER — POLYETHYLENE GLYCOL 3350 17 GM/SCOOP PO POWD: ORAL | 2 refills | Status: AC

## 2016-09-11 NOTE — Patient Instructions (Signed)
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  We are going to treat you with the regimen FOLFOX.  You will have a pump that you take home home for 46 hours.  This treatment is with palliative intent, which means you are treatable but not curable.  You will see the doctor regularly throughout treatment.  We monitor your lab work prior to every treatment.  The doctor monitors your response to treatment by the way you are feeling, your blood work, and scans periodically.  You will experience wait times while you are here for chemotherapy.  Lab results take about 30 minutes to 1 hour to result.  There is a wait time for pharmacy to mix your medications.    You will have pre-medications that you receive prior to each chemotherapy: Premeds: Aloxi - high powered nausea/vomiting prevention medication used for chemotherapy patients.  Dexamethasone - steroid - given to reduce the risk of you having an allergic type reaction to the chemotherapy. Dex can cause you to feel energized, nervous/anxious/jittery, make you have trouble sleeping, and/or make you feel hot/flushed in the face/neck and/or look pink/red in the face/neck. These side effects will pass as the Dex wears off. (takes 20 minutes to infuse)   POTENTIAL SIDE EFFECTS OF TREATMENT:  5-Fluorouracil (Adrucil)  About This Drug Fluorouracil is used to treat cancer. It is given in the vein (IV).  Possible Side Effects . Hair loss. Hair loss is often temporary, although with certain medicine, hair loss can sometimes be permanent. Hair loss may happen suddenly or gradually. If you lose hair, you may lose it from your head, face, armpits, pubic area, chest, and/or legs. You may also notice your hair getting thin. . Changes in your nail color, nail loss and/or brittle nail . Darkening of the skin, or changes to the color of your skin and/or veins used for  infusion . Rash, itching . Nausea and throwing up (vomiting) . Loose bowel movements (diarrhea) . Ulcers - sores that may cause pain or bleeding in your digestive tract, which includes your mouth, esophagus, stomach, small/large intestines and rectum . Soreness of the mouth and throat. You may have red areas, white patches, or sores that hurt. . Decreased appetite (decreased hunger) . Changes in the tissue of the heart and/or heart attack. Some changes may happen that can cause your heart to have less ability to pump blood. . Bone marrow depression. This is a decrease in the number of white blood cells, red blood cells, and platelets. This may raise your risk of infection, make you tired and weak (fatigue), and raise your risk of bleeding . Sensitivity to light (photosensitivity). Photosensitivity means that you may become more sensitive to the sun and/or light. Your eyes may water more, mostly in bright light. . Allergic reaction: Allergic reactions, including anaphylaxis are rare but may happen in some patients. Signs of allergic reaction to this drug may be swelling of the face, feeling like your tongue or throat are swelling, trouble breathing, rash, itching, fever, chills, feeling dizzy, and/or feeling that your heart is beating in a fast or not normal way. If this happens, do not take another dose of this drug. You should get urgent medical treatment. . Blurred vision or other changes in eyesight Note: Not all possible side effects are included above.  Warnings and Precautions . Hand-and-foot syndrome. The palms of your hands or soles of your feet may  tingle, become numb, painful, swollen, or red. . Changes in your central nervous system can happen. The central nervous system is made up of your brain and spinal cord. You could feel extreme tiredness, agitation, confusion, hallucinations (see or hear things that are not there), trouble understanding or speaking, loss of control of  your bowels or bladder, eyesight changes, numbness or lack of strength to your arms, legs, face, or body, and coma. If you start to have any of these symptoms let your doctor know right away. . Side effects of this drug may be unexpectedly severe in some patients Note: Some of the side effects above are very rare. If you have concerns and/or questions, please discuss them with your medical team.  Important Information . This drug may be present in the saliva, tears, sweat, urine, stool, vomit, semen, and vaginal secretions. Talk to your doctor and/or your nurse about the necessary precautions to take during this time.  Treating Side Effects . To help with hair loss, wash with a mild shampoo and avoid washing your hair every day. . Avoid rubbing your scalp, pat your hair or scalp dry. . Avoid coloring your hair. . Limit your use of hair spray, electric curlers, blow dryers, and curling irons. . If you are interested in getting a wig, talk to your nurse. You can also call the [facility] at [phone]-2345 to find out information about the "Look Good, Feel Better" program close to where you live. It is a free program where women getting chemotherapy can learn about wigs, turbans and scarves as well as makeup techniques and skin and nail care. Marland Kitchen Keeping your nails moisturized may help with brittleness. . To help with itching, moisturize your skin several times day. . Avoid sun exposure and apply sunscreen routinely when outdoors. . If you get a rash do not put anything on it unless your doctor or nurse says you may. Keep the area around the rash clean and dry. Ask your doctor for medicine if your rash bothers you. . To help with decreased appetite, eat small, frequent meals. . Eat high caloric food such as pudding, ice cream, yogurt and milkshakes. . Drink plenty of fluids (a minimum of eight glasses per day is recommended). . If you throw up or have loose bowel movements, you  should drink more fluids so that you do not become dehydrated (lack water in the body from losing too much fluid). . To help with nausea and vomiting, eat small, frequent meals instead of three large meals a day. Choose foods and drinks that are at room temperature. Ask your nurse or doctor about other helpful tips and medicine that is available to help or stop lessen these symptoms. . If you get diarrhea, eat low-fiber foods that are high in protein and calories and avoid foods that can irritate your digestive tracts or lead to cramping. . Ask your nurse or doctor about medicine that can lessen or stop your diarrhea. . Mouth care is very important. Your mouth care should consist of routine, gentle cleaning of your teeth or dentures and rinsing your mouth with a mixture of 1/2 teaspoon of salt in 8 ounces of water or  teaspoon of baking soda in 8 ounces of water. This should be done at least after each meal and at bedtime. . If you have mouth sores, avoid mouthwash that has alcohol. Also avoid alcohol and smoking because they can bother your mouth and throat. . Manage tiredness by pacing your activities  for the day. . Be sure to include periods of rest between energy-draining activities. . To help decrease your risk of infections, wash your hands regularly. . Avoid close contact with people who have a cold, the flu, or other infections. . Use a soft toothbrush. Check with your nurse before using dental floss. . Be very careful when using knives or tools. . Use an electric shaver instead of a razor.  Food and Drug Interactions . There are no known interactions of fluorouracil with food. . Check with your doctor or pharmacist about all other prescription medicines and dietary supplements you are taking before starting this medicine as there are a lot of known drug interactions with fluorouracil. Also, check with your doctor or pharmacist before starting any new prescription or  over-the-counter medicines, or dietary supplement to make sure that there are no interactions.  When to Call the Doctor Call your doctor or nurse if you have any of these symptoms and/or any new or unusual symptoms: . Fever of 100.5 F (38 C) or higher . Chills . Easy bleeding or bruising . Trouble breathing . Feeling dizzy or lightheaded . Feeling that your heart is beating in a fast or not normal way (palpitations) . Chest pain or symptoms of a heart attack. Most heart attacks involve pain in the center of the chest that lasts more than a few minutes. The pain may go away and come back or it can be constant. It can feel like pressure, squeezing, fullness, or pain. Sometimes pain is felt in one or both arms, the back, neck, jaw, or stomach. If any of these symptoms last 2 minutes, call 911. Marland Kitchen Confusion and/or agitation . Hallucinations . Trouble understanding or speaking . Blurry vision or changes in your eyesight . Numbness or lack of strength to your arms, legs, face, or body . Nausea that stops you from eating or drinking and/or is not relieved by prescribed medicines . Throwing up more than 3 times a day . Loose bowel movements (diarrhea) 4 times a day or loose bowel movements with lack of strength or a feeling of being dizzy . Lasting loss of appetite or rapid weight loss of five pounds in a week . Pain in your mouth or throat that makes it hard to eat or drink . Pain along the digestive tract - especially if worse after eating . Blood in your vomit (bright red or coffee-ground) and/or stools (bright red, or black/tarry) . Coughing up blood . Fatigue that interferes with your daily activities . Painful, red, or swollen areas on your hands or feet . Numbness and/or tingling of your hands and/or feet . Signs of allergic reaction: swelling of the face, feeling like your tongue or throat are swelling, trouble breathing, rash, itching, fever, chills, feeling dizzy, and/or feeling  that your heart is beating in a fast or not normal way . If you think you are pregnant or may have impregnated your partner  Reproduction Warnings . Pregnancy warning: This drug may have harmful effects on the unborn baby. Women of child bearing potential should use effective methods of birth control during your cancer treatment. Let your doctor know right away if you think you may be pregnant. . Breastfeeding warning: It is not known if this drug passes into breast milk. For this reason, women should talk to their doctor about the risks and benefits of breast feeding during treatment with this drug because this drug may enter the breast milk and cause harm to a  breast feeding baby. . Fertility warning: In men and women both, this drug may affect your ability to have children in the future. Talk with your doctor or nurse if you plan to have children. Ask for information on sperm or egg banking.   Leucovorin Calcium (Generic Name) Other Names: Leucovorin, Wellcovorin, Folinic Acid, Citrovorum Factor  About This Drug Leucovorin is a vitamin. It is used in combination with other cancer fighting drugs such as 5-fluorouracil and methotrexate. Leucovorin helps 5-fluorouracil kill the cancer cells. It also helps to reduce the side effects of methotrexate. Leucovorin is given in the vein (IV) or by mouth (orally).  Will take 2 hours to infuse.  Possible Side Effects . Rash . Wheezing Leucovorin by itself has very few side effects. Side effects you may have can be caused by the other drugs you are taking, such as 5-fluorouracil or methotrexate.  Allergic Reactions . Allergic reactions to this drug are rare. While you are getting this drug by in your vein (IV), please tell your nurse right away if you have any of these symptoms of an allergic reaction: . Trouble catching your breath . Feeling like your tongue or throat are swelling . Feeling your heart beat quickly or in a not normal way  (palpitations) . Feeling dizzy or lightheaded . Flushing, itching, rash, and/or hives . If you are taking the oral form of leucovorin and you have these symptoms, do not take another dose of this drug and get urgent medical treatment.  Treating Side Effects If you get a rash do not put anything on it unless your doctor or nurse says you may. Keep the area around the rash clean and dry. Ask your doctor for medicine if your rash bothers you.  Food and Drug Interactions There are no known interactions of leucovorin with food. This drug may interact with other medicines. Tell your doctor and pharmacist about all the medicines and dietary supplements (vitamins, minerals, herbs and others) that you are taking at this time. The safety and use of dietary supplements and alternative diets are often not known. Using these might affect your cancer or interfere with your treatment. Until more is known, you should not use dietary supplements or alternative diets without your cancer doctor's help.  When to Call the Doctor . Call your doctor or nurse right away if you have any of these symptoms: . Wheezing or trouble breathing . Rash with or without itching, redness, or hives . If you take leucovorin by mouth, please also call your doctor right away if you have: Marland Kitchen Throwing up (vomiting) . Loose bowel movements (diarrhea)  Reproduction Concerns . Pregnancy warning: It is not known if this drug may harm an unborn child. For this reason, be sure to talk with your doctor if you are pregnant or planning to become pregnant while getting this drug. . Genetic counseling is available for you to talk about the effects of this drug therapy on future pregnancies. Also, a genetic counselor can look at the possible risk of problems in the unborn baby due to this medicine if an exposure happens during pregnancy. . Breast feeding warning: It is not known if this drug passes into breast milk. For this  reason, women should talk to their doctor about the risks and benefits of breast feeding during treatment with this drug because this drug may enter the breast milk and badly harm a breast feeding baby.    Oxaliplatin (Generic Name) Other Names: EloxatinTM  About This Drug  Oxaliplatin is used to treat cancer. It is given in the vein (IV).  Will take 2 hours to infuse and will infuse with the leucovorin.    Possible Side Effects (More Common) . Effects on the nerves are called peripheral neuropathy. You may feel numbness, tingling, or pain in your hands and feet. Cold temperatures can make it worse. Avoid cold drinks with ice. Dress warmly and cover the skin if you go out in the cold. Wear socks or gloves if you have to touch cold objects like cold flooring or items in the refrigerator or freezer. . It may be hard for you to button your clothes, open jars, or walk as usual. The effect on the nerves may get worse with more doses of the drug. These effects get better in some people after the drug is stopped but it does not get better in all people . Bone marrow depression: This is a decrease in the number of white blood cells, red blood cells, and platelets. It may increase your risk for infection, make you tired and weak (fatigue), and raise your risk of bleeding. . Nausea and throwing up (vomiting). These symptoms may happen within a few hours after your treatment and may last up to 72 hours. Medicines are available to stop or lessen these side effects. . Electrolyte changes. Your blood will be checked for electrolyte changes as needed.  Possible Side Effects (Less Common) . Skin and tissue irritation: This may involve redness, pain, warmth, or swelling at the IV site. This happens if the drug leaks out of the vein and into nearby tissue. . Serious allergic reactions including anaphylaxis are rare. While you are getting this drug in your vein (IV), tell your nurse right away if you have  any of these symptoms of an allergic reaction: . Trouble catching your breath. . Feeling like your tongue or throat are swelling. . Feeling your heart beat quickly or in a not normal way (palpitations). . Feeling dizzy or lightheaded. . Flushing, itching, rash, or hives. . Changes in your liver function. Your doctor will check your liver function as needed.  Treating Side Effects Oxaliplatin (Generic Name) continued Page 12 of 14 . Do not drink cold drinks or use ice cubes in drinks. Drink fluids at room temperature or warmer. Drink through a straw. . Wear gloves to touch cold objects. Be aware that most metals are cold to the touch, especially in winter. Examples are your car door handle and your mail box latch. . Wear warm clothing in cold weather at all times. Cover your mouth and nose with a scarf or a pulldown cap (ski cap) to warm the air that goes to your lungs. . Ask your doctor or nurse about medicine to treat nausea, throwing up, headache, or loose bowel movements. . While you are getting this drug in your vein, tell your nurse right away if you have any pain, redness, or swelling at the site of the IV infusion. . Mouth care is very important. Your mouth care should consist of routine, gentle cleaning of your teeth or dentures and rinsing your mouth with a mixture of 1/2 teaspoon of salt in 8 ounces of water or  teaspoon of baking soda in 8 ounces of water. This should be done at least after each meal and at bedtime. . Drink 6-8 cups of fluids each day unless your doctor has told you to limit your fluid intake due to some other health problem. A cup is 8 ounces  of fluid. If you throw up or have loose bowel movements you should drink more fluids so that you do not become dehydrated (lack water in the body due to losing too much fluid).  Food and Drug Interactions There are no known interactions of oxaliplatin with food. This drug may interact with other medicines. Tell your  doctor and pharmacist about all the medicines and dietary supplements (vitamins, minerals, herbs and others) that you are taking at this time. The safety and use of dietary supplements and alternative diets are often not known. Using these might affect your cancer or interfere with your treatment. Until more is known, you should not use dietary supplements or alternative diets without your cancer doctor's help.  When to Call the Doctor Call your doctor or nurse right away if you have any of these symptoms: . Fever of 100.5 F (38 C) or above . Chills . Easy bruising or bleeding . Wheezing or trouble breathing . Rash or itching . Feeling dizzy or lightheaded . Feeling that your heart is beating in a fast or not normal way (palpitations) . Loose bowel movements (diarrhea) more than 4 times a day or diarrhea with weakness or feeling lightheaded . Blurred vision or other changes in eyesight . Pain when passing urine; blood in urine . Pain in your lower back or side . Feeling confused or agitated . Nausea that stops you from eating or drinking . Throwing up more than 3 times a day . Chest pain or symptoms of a heart attack. Most heart attacks involve pain in the center of the chest that lasts more than a few minutes. The pain may go away and come back. It can feel like pressure, squeezing, fullness, or pain. Sometimes pain is felt in one or both arms, the back, neck, jaw, or stomach. If any of these symptoms last 2 minutes, call 911. Marland Kitchen Symptoms of a stroke such as sudden numbness or weakness of your face, arm, or leg, mostly on one side of your body; sudden confusion, trouble speaking or understanding; sudden trouble seeing in one or both eyes; sudden trouble walking, feeling dizzy, loss of balance or coordination; or sudden, bad headache with no known cause. If you have any of these symptoms for 2 minutes, call 911. . Signs of liver problems: dark urine, pale bowel movements, bad stomach  pain, feeling very tired and weak, itching, or yellowing of the eyes or skin. Call your doctor or nurse as soon as possible if any of these symptoms happen: . Change in hearing, ringing in the ears . Decreased urine . Unusual thirst or passing urine often . Pain in your mouth or throat that makes it hard to eat or drink . Nausea that is not relieved by prescribed medicines . Rash that is not relieved by prescribed medicines . Heavy menstrual period that lasts longer than normal . Numbness, tingling, decreased feeling or weakness in fingers, toes, arms, or legs . Trouble walking or changes in the way you walk, feeling clumsy when buttoning clothes, opening jars, or other routine hand motions . Swelling of legs, ankles, or feet . Weight gain of 5 pounds in one week (fluid retention) . Lasting loss of appetite or rapid weight loss of five pounds in a week . Fatigue that interferes with your daily activities . Headache that does not go away . Painful, red, or swollen areas on your hands or feet. . No bowel movement for 3 days or you feel uncomfortable . Extreme weakness  that interferes with normal activities  Reproduction Concerns Infertility Warning: Sexual problems and reproduction concerns may happen. In both men and women, this drug may affect your ability to have children. This cannot be determined before your treatment. Talk with your doctor or nurse if you plan to have children. Ask for information on sperm or egg banking. In men, this drug may interfere with your ability to make sperm, but it should not change your ability to have sexual relations. In women, menstrual bleeding may become irregular or stop while you are getting this drug. Do not assume that you cannot become pregnant if you do not have a menstrual period. Women may go through signs of menopause (change of life) like vaginal dryness or itching. Vaginal lubricants can be used to lessen vaginal dryness, itching, and  pain during sexual relations. Genetic counseling is available for you to talk about the effects of this drug therapy on future pregnancies. Also, a genetic counselor can look at the possible risk of problems in the unborn baby due to this medicine if an exposure happens during pregnancy. Pregnancy warning: This drug may have harmful effects on the unborn child, so effective methods of birth control should be used during your cancer treatment. Breast feeding warning: It is not known if this drug passes into breast milk. For this reason, women should talk to their doctor about the risks and benefits of breast feeding during treatment with this drug because this drug may enter the breast milk and badly harm a breast feeding baby.   SELF CARE ACTIVITIES WHILE ON CHEMOTHERAPY: Hydration Increase your fluid intake 48 hours prior to treatment and drink at least 8 to 12 cups (64 ounces) of water/decaff beverages per day after treatment. You can still have your cup of coffee or soda but these beverages do not count as part of your 8 to 12 cups that you need to drink daily. No alcohol intake.  Medications Continue taking your normal prescription medication as prescribed.  If you start any new herbal or new supplements please let us know first to make sure it is safe.  Mouth Care Have teeth cleaned professionally before starting treatment. Keep dentures and partial plates clean. Use soft toothbrush and do not use mouthwashes that contain alcohol. Biotene is a good mouthwash that is available at most pharmacies or may be ordered by calling [phone]. Use warm salt water gargles (1 teaspoon salt per 1 quart warm water) before and after meals and at bedtime. Or you may rinse with 2 tablespoons of three-percent hydrogen peroxide mixed in eight ounces of water. If you are still having problems with your mouth or sores in your mouth please call the clinic. If you need dental work, please let the doctor know  before you go for your appointment so that we can coordinate the best possible time for you in regards to your chemo regimen. You need to also let your dentist know that you are actively taking chemo. We may need to do labs prior to your dental appointment.   Skin Care Always use sunscreen that has not expired and with SPF (Sun Protection Factor) of 50 or higher. Wear hats to protect your head from the sun. Remember to use sunscreen on your hands, ears, face, & feet.  Use good moisturizing lotions such as udder cream, eucerin, or even Vaseline. Some chemotherapies can cause dry skin, color changes in your skin and nails.    . Avoid long, hot showers or baths. Marland Kitchen  Use gentle, fragrance-free soaps and laundry detergent. . Use moisturizers, preferably creams or ointments rather than lotions because the thicker consistency is better at preventing skin dehydration. Apply the cream or ointment within 15 minutes of showering. Reapply moisturizer at night, and moisturize your hands every time after you wash them.  Hair Loss (if your doctor says your hair will fall out)  . If your doctor says that your hair is likely to fall out, decide before you begin chemo whether you want to wear a wig. You may want to shop before treatment to match your hair color. . Hats, turbans, and scarves can also camouflage hair loss, although some people prefer to leave their heads uncovered. If you go bare-headed outdoors, be sure to use sunscreen on your scalp. . Cut your hair short. It eases the inconvenience of shedding lots of hair, but it also can reduce the emotional impact of watching your hair fall out. . Don't perm or color your hair during chemotherapy. Those chemical treatments are already damaging to hair and can enhance hair loss. Once your chemo treatments are done and your hair has grown back, it's OK to resume dyeing or perming hair. With chemotherapy, hair loss is almost always temporary. But when it grows back, it  may be a different color or texture. In older adults who still had hair color before chemotherapy, the new growth may be completely gray.  Often, new hair is very fine and soft.  Infection Prevention Please wash your hands for at least 30 seconds using warm soapy water. Handwashing is the #1 way to prevent the spread of germs. Stay away from sick people or people who are getting over a cold. If you develop respiratory systems such as green/yellow mucus production or productive cough or persistent cough let us know and we will see if you need an antibiotic. It is a good idea to keep a pair of gloves on when going into grocery stores/[employer] to decrease your risk of coming into contact with germs on the carts, etc. Carry alcohol hand gel with you at all times and use it frequently if out in public. If your temperature reaches 100.5 or higher please call the clinic and let us know.  If it is after hours or on the weekend please go to the ER if your temperature is over 100.5.  Please have your own personal thermometer at home to use.    Sex and bodily fluids If you are going to have sex, a condom must be used to protect the person that isn't taking chemotherapy. Chemo can decrease your libido (sex drive). For a few days after chemotherapy, chemotherapy can be excreted through your bodily fluids.  When using the toilet please close the lid and flush the toilet twice.  Do this for a few day after you have had chemotherapy.     Effects of chemotherapy on your sex life Some changes are simple and won't last long. They won't affect your sex life permanently. Sometimes you may feel: . too tired . not strong enough to be very active . sick or sore  . not in the mood . anxious or low Your anxiety might not seem related to sex. For example, you may be worried about the cancer and how your treatment is going. Or you may be worried about money, or about how you family are coping with your illness. These things  can cause stress, which can affect your interest in sex. It's important to talk  to your partner about how you feel. Remember - the changes to your sex life don't usually last long. There's usually no medical reason to stop having sex during chemo. The drugs won't have any long term physical effects on your performance or enjoyment of sex. Cancer can't be passed on to your partner during sex  Contraception It's important to use reliable contraception during treatment. Avoid getting pregnant while you or your partner are having chemotherapy. This is because the drugs may harm the baby. Sometimes chemotherapy drugs can leave a man or woman infertile.  This means you would not be able to have children in the future. You might want to talk to someone about permanent infertility. It can be very difficult to learn that you may no longer be able to have children. Some people find counselling helpful. There might be ways to preserve your fertility, although this is easier for men than for women. You may want to speak to a fertility expert. You can talk about sperm banking or harvesting your eggs. You can also ask about other fertility options, such as donor eggs. If you have or have had breast cancer, your doctor might advise you not to take the contraceptive pill. This is because the hormones in it might affect the cancer.  It is not known for sure whether or not chemotherapy drugs can be passed on through semen or secretions from the vagina. Because of this some doctors advise people to use a barrier method if you have sex during treatment. This applies to vaginal, anal or oral sex. Generally, doctors advise a barrier method only for the time you are actually having the treatment and for about a week after your treatment. Advice like this can be worrying, but this does not mean that you have to avoid being intimate with your partner. You can still have close contact with your partner and continue to enjoy  sex.  Animals If you have cats or birds we just ask that you not change the litter or change the cage.  Please have someone else do this for you while you are on chemotherapy.   Food Safety During and After Cancer Treatment Food safety is important for people both during and after cancer treatment. Cancer and cancer treatments, such as chemotherapy, radiation therapy, and stem cell/bone marrow transplantation, often weaken the immune system. This makes it harder for your body to protect itself from foodborne illness, also called food poisoning. Foodborne illness is caused by eating food that contains harmful bacteria, parasites, or viruses.  Foods to avoid Some foods have a higher risk of becoming tainted with bacteria. These include: Marland Kitchen Unwashed fresh fruit and vegetables, especially leafy vegetables that can hide dirt and other contaminants . Raw sprouts, such as alfalfa sprouts . Raw or undercooked beef, especially ground beef, or other raw or undercooked meat and poultry . Fatty, fried, or spicy foods immediately before or after treatment.  These can sit heavy on your stomach and make you feel nauseous. . Raw or undercooked shellfish, such as oysters. . Sushi and sashimi, which often contain raw fish.  . Unpasteurized beverages, such as unpasteurized fruit juices, raw milk, raw yogurt, or cider . Undercooked eggs, such as soft boiled, over easy, and poached; raw, unpasteurized eggs; or foods made with raw egg, such as homemade raw cookie dough and homemade mayonnaise Simple steps for food safety Shop smart. . Do not buy food stored or displayed in an unclean area. . Do not buy bruised  or damaged fruits or vegetables. . Do not buy cans that have cracks, dents, or bulges. . Pick up foods that can spoil at the end of your shopping trip and store them in a cooler on the way home. Prepare and clean up foods carefully. . Rinse all fresh fruits and vegetables under running water, and dry them  with a clean towel or paper towel. . Clean the top of cans before opening them. . After preparing food, wash your hands for 20 seconds with hot water and soap. Pay special attention to areas between fingers and under nails. . Clean your utensils and dishes with hot water and soap. Marland Kitchen Disinfect your kitchen and cutting boards using 1 teaspoon of liquid, unscented bleach mixed into 1 quart of water.   Dispose of old food. . Eat canned and packaged food before its expiration date (the "use by" or "best before" date). . Consume refrigerated leftovers within 3 to 4 days. After that time, throw out the food. Even if the food does not smell or look spoiled, it still may be unsafe. Some bacteria, such as Listeria, can grow even on foods stored in the refrigerator if they are kept for too long. Take precautions when eating out. . At restaurants, avoid buffets and salad bars where food sits out for a long time and comes in contact with many people. Food can become contaminated when someone with a virus, often a norovirus, or another "bug" handles it. . Put any leftover food in a "to-go" container yourself, rather than having the server do it. And, refrigerate leftovers as soon as you get home. . Choose restaurants that are clean and that are willing to prepare your food as you order it cooked.    MEDICATIONS: Dexamethasone 4mg  tablet.  Take 2 tablets (8 mg total) by mouth daily. Start the day after chemotherapy for 2 days. Take with food.  Take with food.                                                                                                                                                               Zofran/Ondansetron 8mg  tablet. Take 1 tablet every 8 hours as needed for nausea/vomiting. (#1 nausea med to take, this can constipate)  Compazine/Prochlorperazine 10mg  tablet. Take 1 tablet every 6 hours as needed for nausea/vomiting. (#2 nausea med to take, this can make you sleepy)   EMLA cream.  Apply a quarter size amount to port site 1 hour prior to chemo. Do not rub in. Cover with plastic wrap.   Over-the-Counter Meds:  Miralax 1 capful in 8 oz of fluid daily. May increase to two times a day if needed. This is a stool softener. If this doesn't work proceed you can add:  Senokot S-start with 1 tablet  two times a day and increase to 4 tablets two times a day if needed. (total of 8 tablets in a 24 hour period). This is a stimulant laxative.   Call us if this does not help your bowels move.   Imodium 2mg  capsule. Take 2 capsules after the 1st loose stool and then 1 capsule every 2 hours until you go a total of 12 hours without having a loose stool. Call the [facility] if loose stools continue. If diarrhea occurs @ bedtime, take 2 capsules @ bedtime. Then take 2 capsules every 4 hours until morning. Call [facility].     Constipation Sheet *Miralax in 8 oz of fluid daily.  May increase to two times a day if needed.  This is a stool softener.  If this not enough to keep your bowel regular:  You can add:  *Senokot S, start with one tablet twice a day and can increase to 4 tablets twice a day if needed.  This is a stimulant laxative.   Sometimes when you take pain medication you need BOTH a medicine to keep your stool soft and a medicine to help your bowel push it out!  Please call if the above does not work for you.   Do not go more than 2 days without a bowel movement.  It is very important that you do not become constipated.  It will make you feel sick to your stomach (nausea) and can cause abdominal pain and vomiting.    Diarrhea Sheet  If you are having loose stools/diarrhea, please purchase Imodium and begin taking as outlined:  At the first sign of poorly formed or loose stools you should begin taking Imodium(loperamide) 2 mg capsules.  Take two caplets (4mg ) followed by one caplet (2mg ) every 2 hours until you have had no diarrhea for 12 hours.  During the night take  two caplets (4mg ) at bedtime and continue every 4 hours during the night until the morning.  Stop taking Imodium only after there is no sign of diarrhea for 12 hours.    Always call the [facility] if you are having loose stools/diarrhea that you can't get under control.  Loose stools/disrrhea leads to dehydration (loss of water) in your body.  We have other options of trying to get the loose stools/diarrhea to stopped but you must let us know!    Nausea Sheet  Zofran/Ondansetron 8mg  tablet. Take 1 tablet every 8 hours as needed for nausea/vomiting. (#1 nausea med to take, this can constipate)  Compazine/Prochlorperazine 10mg  tablet. Take 1 tablet every 6 hours as needed for nausea/vomiting. (#2 nausea med to take, this can make you sleepy)  You can take these medications together or separately.  We would first like for you to try the Ondansetron by itself and then take the Prochloperizine if needed. But you are allowed to take both medications at the same time if your nausea is that severe.  If you are having persistent nausea (nausea that does not stop) please take these medications on a staggered schedule so that the nausea medication stays in your body.  Please call the [facility] and let us know the amount of nausea that you are experiencing.  If you begin to vomit, you need to call the [facility] and if it is the weekend and you have vomited more than one time and cant get it to stop-go to the Emergency Room.  Persistent nausea/vomiting can lead to dehydration (loss of fluid in your body)  and will make you feel terrible.   Ice chips, sips of clear liquids, foods that are @ room temperature, crackers, and toast tend to be better tolerated.     SYMPTOMS TO REPORT AS SOON AS POSSIBLE AFTER TREATMENT:  FEVER GREATER THAN 100.5 F  CHILLS WITH OR WITHOUT FEVER  NAUSEA AND VOMITING THAT IS NOT CONTROLLED WITH YOUR NAUSEA MEDICATION  UNUSUAL SHORTNESS OF BREATH  UNUSUAL BRUISING OR  BLEEDING  TENDERNESS IN MOUTH AND THROAT WITH OR WITHOUT PRESENCE OF ULCERS  URINARY PROBLEMS  BOWEL PROBLEMS  UNUSUAL RASH    Wear comfortable clothing and clothing appropriate for easy access to any Portacath or PICC line. Let us know if there is anything that we can do to make your therapy better!     What to do if you need assistance after hours or on the weekends: CALL [phone].  HOLD on the line, do not hang up.  You will hear multiple messages but at the end you will be connected with a nurse triage line.  They will contact the doctor if necessary.  Most of the time they will be able to assist you.  Do not call the hospital operator.       I have been informed and understand all of the instructions given to me and have received a copy. I have been instructed to call the clinic [phone] or my family physician as soon as possible for continued medical care, if indicated. I do not have any more questions at this time but understand that I may call the [facility] or the Patient Navigator at [phone] during office hours should I have questions or need assistance in obtaining follow-up care.

## 2016-09-11 NOTE — ED Provider Notes (Signed)
[facility] DEPT Provider Note   CSN: [id number] Arrival date & time: 09/11/16  1942     History   Chief Complaint Chief Complaint  Patient presents with  . Leg Swelling    HPI Jodi Collins is a 66 y.o. female.  HPI Patient has a complicated history of gastric cancer and left lower extremity DVT treated with IVC filter because of gastric cancer and GI bleeding.. Patient started having worsening left leg swelling and presented back to the emergency room on June 22. The patient's clinical course was complicated by sepsis felt to be related to UTI. Patient also had acute blood loss anemia and was transfused several units of packed red blood cells. Patient was discharged from the hospital today at about 10 AM.  Patient was wearing compression stockings. When she was at home today after being discharged she removed the pressure stocking.  He noticed that her toes were dark in color. Her foot was also swollen. Past Medical History:  Diagnosis Date  . Anemia   . DVT (deep venous thrombosis) ([place])   . Stomach cancer [facility])     Patient Active Problem List   Diagnosis Date Noted  . Sepsis ([place]) 09/08/2016  . Left leg DVT ([place]) 09/08/2016  . Hyperkalemia 09/08/2016  . Acute renal failure (ARF) ([place]) 09/08/2016  . Gastric cancer ([place]) 09/06/2016  . Pulmonary emboli ([place]) 08/28/2016  . Anemia 08/28/2016    Past Surgical History:  Procedure Laterality Date  . CERVICAL CONIZATION W/BX  2012   high grade squamous intraepithelial dysplasia.  Dr Garwin Brothers.   . COLONOSCOPY W/ POLYPECTOMY  10/2006 and 10/2011   Dr Collene Mares.  had adenomatous polyps.    . ESOPHAGOGASTRODUODENOSCOPY N/A 08/30/2016   Procedure: ESOPHAGOGASTRODUODENOSCOPY (EGD);  Surgeon: Carol Ada, MD;  Location: [facility];  Service: Gastroenterology;  Laterality: N/A;  . IR FLUORO GUIDE PORT INSERTION RIGHT  09/01/2016  . IR IVC FILTER PLMT / S&I /IMG GUID/MOD SED  08/29/2016  . IR US GUIDE VASC ACCESS RIGHT   09/01/2016    OB History    Gravida Para Term Preterm AB Living   0 0 0 0 0 0   SAB TAB Ectopic Multiple Live Births   0 0 0 0 0       Home Medications    Prior to Admission medications   Medication Sig Start Date End Date Taking? Authorizing Provider  ciprofloxacin (CIPRO) 500 MG tablet Take 1 tablet (500 mg total) by mouth 2 (two) times daily. 09/11/16 09/16/16  Robbie Lis, MD  dexamethasone (DECADRON) 4 MG tablet Take 2 tablets (8 mg total) by mouth daily. Start the day after chemotherapy for 2 days. Take with food. 09/11/16   Twana First, MD  fluorouracil CALGB 44034 in sodium chloride 0.9 % 150 mL Inject into the vein over 96 hr. Every 2 weeks, wears pump for 46 hours    [provider]  leucovorin in dextrose 5 % 250 mL Inject into the vein once. Every 2 weeks    [provider]  lidocaine-prilocaine (EMLA) cream Apply to affected area once 09/07/16   Twana First, MD  ondansetron (ZOFRAN) 8 MG tablet Take 1 tablet (8 mg total) by mouth 2 (two) times daily as needed for refractory nausea / vomiting. Start on day 3 after chemotherapy. 09/07/16   Twana First, MD  OXALIPLATIN IV Inject into the vein. Every 2 weeks    [provider]  oxyCODONE-acetaminophen (PERCOCET/ROXICET) 5-325 MG tablet Take 1-2 tablets by mouth  every 4 (four) hours as needed for severe pain. 09/07/16   Twana First, MD  polyethylene glycol powder [facility]) powder 1 capful daily as needed 09/11/16   Twana First, MD  prochlorperazine (COMPAZINE) 10 MG tablet Take 1 tablet (10 mg total) by mouth every 6 (six) hours as needed (Nausea or vomiting). 09/07/16   Twana First, MD    Family History Family History  Problem Relation Age of Onset  . Hypertension Mother   . Diabetes Mother     Social History Social History  Substance Use Topics  . Smoking status: Never Smoker  . Smokeless tobacco: Never Used  . Alcohol use Yes     Comment: rarely     Allergies   Patient has  no known allergies.   Review of Systems Review of Systems  All other systems reviewed and are negative.    Physical Exam Updated Vital Signs BP 105/72   Pulse (!) 103   Temp 97.4 F (36.3 C) (Oral)   Resp (!) 21   Ht 1.575 m (5\' 2" )   Wt 63.5 kg (140 lb)   SpO2 100%   BMI 25.61 kg/m   Physical Exam  Constitutional: No distress.  HENT:  Head: Normocephalic and atraumatic.  Right Ear: External ear normal.  Left Ear: External ear normal.  Eyes: Conjunctivae are normal. Right eye exhibits no discharge. Left eye exhibits no discharge. No scleral icterus.  Neck: Neck supple. No tracheal deviation present.  Cardiovascular: Normal rate, regular rhythm and intact distal pulses.   Pulmonary/Chest: Effort normal and breath sounds normal. No stridor. No respiratory distress. She has no wheezes. She has no rales.  Abdominal: Soft. Bowel sounds are normal. She exhibits no distension. There is no tenderness. There is no rebound and no guarding.  Musculoskeletal: She exhibits edema.       Left foot: There is tenderness and swelling.  Patient has cyanosis from the midfoot down to her toes,  her toes are cool to the touch., Absent dorsalis pedis pulses, the nurse is able to Doppler a posterior tibial pulse, there is a large boggy blisterlike swelling to the proximal aspect of the foot  Neurological: She is alert. She has normal strength. No cranial nerve deficit (no facial droop, extraocular movements intact, no slurred speech) or sensory deficit. She exhibits normal muscle tone. She displays no seizure activity. Coordination normal.  Skin: Skin is warm and dry. No rash noted.  Psychiatric: She has a normal mood and affect.  Nursing note and vitals reviewed.      ED Treatments / Results  Labs (all labs ordered are listed, but only abnormal results are displayed) Labs Reviewed  CBC WITH DIFFERENTIAL/PLATELET - Abnormal; Notable for the following:       Result Value   WBC 19.2 (*)     RBC 2.88 (*)    Hemoglobin 8.0 (*)    HCT 24.0 (*)    RDW 18.4 (*)    Platelets 109 (*)    Neutro Abs 16.9 (*)    All other components within normal limits  COMPREHENSIVE METABOLIC PANEL - Abnormal; Notable for the following:    Sodium 132 (*)    CO2 16 (*)    Glucose, Bld 121 (*)    BUN 50 (*)    Creatinine, Ser 1.24 (*)    Calcium 8.0 (*)    Total Protein 6.1 (*)    Albumin 1.8 (*)    AST 57 (*)    GFR calc non Af  Amer 45 (*)    GFR calc Af Amer 52 (*)    All other components within normal limits  PROTIME-INR - Abnormal; Notable for the following:    Prothrombin Time 18.0 (*)    All other components within normal limits  APTT - Abnormal; Notable for the following:    aPTT 41 (*)    All other components within normal limits    EKG  EKG Interpretation None       Radiology No results found. DVT study reviewed from May 31. Patient has a DVT left popliteal vein and left posterior tibial vein Procedures Procedures (including critical care time) Blister wound drainage:  Chlorhexidine applied to the boggy tissue, 18-gauge needle is to aspirate the fluid, serous fluid drained Medications Ordered in ED Medications - No data to display   Initial Impression / Assessment and Plan / ED Course  I have reviewed the triage vital signs and the nursing notes.  Pertinent labs & imaging results that were available during my care of the patient were reviewed by me and considered in my medical decision making (see chart for details).  Clinical Course as of Sep 11 2220  Mon Sep 11, 2016  2038 Discussed case with Dr Bridgett Larsson vascular, does not thinks she would have phlegmasia cerulea dolens as that should involve her whole leg.  Likely venous insufficiency from her DVT.  She can be seen in the office tomorrow.  [JK]    Clinical Course User Index [JK] Dorie Rank, MD   Patient presents to the emergency room with cyanosis of her left foot. She was diagnosed recently with a DVT. She is not  an anticoagulation candidate because of her other comorbidities.  Concerned about the possibility of phlegmasia cerulea dolens although she is not having much pain.  Pt was wearing compression stockings but I doubt this would compromise her circulation.  Reviewed labs with patient.  Her hgb is trending downward.  She is scheduled to see her oncologist on Wednesday.  D.w Dr Truman Hayward to see if it would be beneficial to observe overnight.  This is going to be a recurrent issue for her.  She does not need a transfusion now.  Pt is comfortable with discharge.  She will call vascular surgery tomorrow  Final Clinical Impressions(s) / ED Diagnoses   Final diagnoses:  Anemia, unspecified type  Chronic deep vein thrombosis (DVT) of left lower extremity, unspecified vein (HCC)  Skin cyanosis    New Prescriptions New Prescriptions   No medications on file     Dorie Rank, MD 09/11/16 2222

## 2016-09-11 NOTE — Discharge Instructions (Signed)
Ciprofloxacin extended-release tablets What is this medicine? CIPROFLOXACIN (sip roe FLOX a sin) is a quinolone antibiotic. It is used to treat certain kinds of bacterial infections. It will not work for colds, flu, or other viral infections. This medicine may be used for other purposes; ask your health care provider or pharmacist if you have questions. COMMON BRAND NAME(S): Cipro XR, Proquin XR What should I tell my health care provider before I take this medicine? They need to know if you have any of these conditions: -bone problems -history of low potassium levels in the blood -irregular heartbeat -joint problems -kidney disease -myasthenia gravis -seizures -tendon problems -tingling of the fingers or toes, or other nerve disorder -an unusual or allergic reaction to ciprofloxacin, other antibiotics or medicines, foods, dyes, or preservatives -pregnant or trying to get pregnant -breast-feeding How should I use this medicine? Take this medicine by mouth with a full glass of water. Follow the directions on the prescription label. Do not split, crush, or chew the tablet. Take your medicine at regular intervals. Do not take your medicine more often than directed. Take all of your medicine as directed even if you think your are better. Do not skip doses or stop your medicine early. You can take this medicine with food or on an empty stomach. It can be taken with a meal that contains dairy or calcium, but do not take it alone with a dairy product, like milk or yogurt, or calcium-fortified juice. A special MedGuide will be given to you by the pharmacist with each prescription and refill. Be sure to read this information carefully each time. Talk to your pediatrician regarding the use of this medicine in children. Special care may be needed. Overdosage: If you think you have taken too much of this medicine contact a poison control center or emergency room at once. NOTE: This medicine is only for  you. Do not share this medicine with others. What if I miss a dose? If you miss a dose, take it as soon as you can. If it is almost time for your next dose, take only that dose. Do not take double or extra doses. Do not take more than one dose in a day. What may interact with this medicine? Do not take this medicine with any of the following medications: -cisapride -dofetilide -dronedarone -flibanserin -lomitapide -pimozide -thioridazine -tizanidine -ziprasidone This medicine may also interact with the following medications: -antacids -birth control pills -caffeine -certain medicines for diabetes, like glipizide or glyburide -certain medicines that treat or prevent blood clots like warfarin -clozapine -cyclosporine -didanosine (ddI) buffered tablets or powder -duloxetine -lanthanum carbonate -lidocaine -methotrexate -multivitamins -NSAIDS, medicines for pain and inflammation, like ibuprofen or naproxen -olanzapine -omeprazole -other medicines that prolong the QT interval (cause an abnormal heart rhythm) -phenytoin -probenecid -ropinirole -sevelamer -sildenafil -sucralfate -theophylline -zolpidem This list may not describe all possible interactions. Give your health care provider a list of all the medicines, herbs, non-prescription drugs, or dietary supplements you use. Also tell them if you smoke, drink alcohol, or use illegal drugs. Some items may interact with your medicine. What should I watch for while using this medicine? Tell your doctor or health care professional if your symptoms do not improve. Do not treat diarrhea with over the counter products. Contact your doctor if you have diarrhea that lasts more than 2 days or if it is severe and watery. You may get drowsy or dizzy. Do not drive, use machinery, or do anything that needs mental alertness until you  know how this medicine affects you. Do not stand or sit up quickly, especially if you are an older patient.  This reduces the risk of dizzy or fainting spells. This medicine can make you more sensitive to the sun. Keep out of the sun. If you cannot avoid being in the sun, wear protective clothing and use sunscreen. Do not use sun lamps or tanning beds/booths. Avoid antacids, aluminum, calcium, iron, magnesium, and zinc products for 6 hours before and 2 hours after taking a dose of this medicine. What side effects may I notice from receiving this medicine? Side effects that you should report to your doctor or health care professional as soon as possible: -allergic reactions like skin rash or hives, swelling of the face, lips, or tongue -anxious -confusion -depressed mood -diarrhea -fast, irregular heartbeat -hallucination, loss of contact with reality -joint, muscle, or tendon pain or swelling -pain, tingling, numbness in the hands or feet -suicidal thoughts or other mood changes -sunburn -unusually weak or tired Side effects that usually do not require medical attention (report to your doctor or health care professional if they continue or are bothersome): -dry mouth -headache -nausea -trouble sleeping This list may not describe all possible side effects. Call your doctor for medical advice about side effects. You may report side effects to FDA at 1-800-FDA-1088. Where should I keep my medicine? Keep out of the reach of children. Store at room temperature between 15 to 30 degrees C (59 to 86 degrees F). Keep container tightly closed. Throw away any unused medicine after the expiration date. NOTE: This sheet is a summary. It may not cover all possible information. If you have questions about this medicine, talk to your doctor, pharmacist, or health care provider.  2018 [employer] (2015-10-15 14:26:55)

## 2016-09-11 NOTE — Care Management (Signed)
Family in room to take pt home, they requests DME to better care for pt in the home. They need 3 in 1 and RW. Pt is agreeable, has no preference of DME provider. Jermaine, [facility] rep, aware of referral and will obtain pt info form chart and deliver DME to room prior to DC. If pt unable to wait on delivery to room, family will pick up in store. [facility] rep to discuss with pt family.

## 2016-09-11 NOTE — Progress Notes (Signed)
Chemotherapy teaching completed. Consent signed.  Extensive teaching packets given.

## 2016-09-11 NOTE — Discharge Summary (Signed)
Physician Discharge Summary  Jodi Collins IWL:[id number] DOB: [birth year] DOA: 09/08/2016  PCP: Glendale Chard, MD  Admit date: 09/08/2016 Discharge date: 09/11/2016  Recommendations for Outpatient Follow-up:  Continue Cipro for 5 days on discharge  Discharge Diagnoses:  Principal Problem:   Sepsis [facility]) Active Problems:   Pulmonary emboli (HCC)   Anemia   Gastric cancer ([place])   Left leg DVT ([place])   Hyperkalemia   Acute renal failure (ARF) ([place])    Discharge Condition: stable   Diet recommendation: as tolerated   History of present illness:  66 year old female with history of DVT in LLE dx in 07/2016, PE diagnosed on recent admission in 08/2016 was on xarelto but this was stopped due to GI bleed, now status post IVC filter placement on 08/29/2016. Pt also has history of gastric cancer confirmed on EGD 08/31/2106. She was supposed to have blood transfusion on the day of admission but due to worsening swelling in the lower extremity she was sent to ED for evaluation. Blood work on admission showed WBC Count 21.4, hemoglobin 5.5, potassium 6.2, creatinine 6.76 and lactic acid 4.1. She was given empiric vanco and zosyn   Hospital Course:  Principal Problem:   Sepsis ([place]) / UTI / Leukocytosis  - Sepsis criteria met on admission and source thought to be UTI - Blood cx showed no growth - Urine cx showed no growth  - Continue Cipro for 5 days on discharge   Active Problems:   Pulmonary emboli (HCC) / DVT - Not on AC due to GI bleed - Has had IVC filter placed 08/30/2106    Anemia of chronic disease / Acute blood loss anemia - S/p 3 U PRBC transfusion - Hgb stable     Acute renal failure superimposed on CKD stage 3 - Cr 6/20 4.16 and on 6/11 it was 1.14 - Cr 6.76 on admission, elevated due to sepsis - Cr 1.35, has significantly improved since admission with IV fluids    Hyperkalemia - Due to sepsis  - Resolved with kayexalate     Gastric cancer ([place]) - Per  oncology    DVT prophylaxis: SCD  Code Status: full code  Family Communication: no family at the bedside     Consultants:   None  Procedures:   None  Antimicrobials:   Vanco 6/22 --> 6/24  Zosyn 6/22 --> 6/25   Signed:  Leisa Lenz, MD  [facility] 09/11/2016, 10:13 AM  Pager #: [phone]  Time spent in minutes: more than 30 minutes    Discharge Exam: Vitals:   09/10/16 2238 09/11/16 0550  BP: (!) 105/57 106/63  Pulse: 95 95  Resp: 16 16  Temp: 98.5 F (36.9 C) 98.3 F (36.8 C)   Vitals:   09/10/16 1000 09/10/16 1531 09/10/16 2238 09/11/16 0550  BP: 115/79 99/70 (!) 105/57 106/63  Pulse: 95 95 95 95  Resp: 19 16 16 16   Temp:  97.7 F (36.5 C) 98.5 F (36.9 C) 98.3 F (36.8 C)  TempSrc:  Oral Oral Oral  SpO2: 98% 100% 98% 99%  Weight:      Height:        General: Pt is alert, follows commands appropriately, not in acute distress Cardiovascular: Regular rate and rhythm, S1/S2 + Respiratory: Clear to auscultation bilaterally, no wheezing, no crackles, no rhonchi Abdominal: Soft, non tender, non distended, bowel sounds +, no guarding Extremities: LLE edema +1, no cyanosis, pulses palpable bilaterally DP and PT Neuro: Grossly nonfocal  Discharge Instructions  Discharge Instructions    Call MD for:  persistant nausea and vomiting    Complete by:  As directed    Call MD for:  redness, tenderness, or signs of infection (pain, swelling, redness, odor or green/yellow discharge around incision site)    Complete by:  As directed    Call MD for:  severe uncontrolled pain    Complete by:  As directed    Diet - low sodium heart healthy    Complete by:  As directed    Discharge instructions    Complete by:  As directed    Continue Cipro for 5 days on discharge   Increase activity slowly    Complete by:  As directed      Allergies as of 09/11/2016   No Known Allergies     Medication List    STOP taking these medications    dexamethasone 4 MG tablet Commonly known as:  DECADRON     TAKE these medications   ciprofloxacin 500 MG tablet Commonly known as:  CIPRO Take 1 tablet (500 mg total) by mouth 2 (two) times daily.   fluorouracil CALGB 18299 in sodium chloride 0.9 % 150 mL Inject into the vein over 96 hr. Every 2 weeks, wears pump for 46 hours   leucovorin in dextrose 5 % 250 mL Inject into the vein once. Every 2 weeks   lidocaine-prilocaine cream Commonly known as:  EMLA Apply to affected area once   ondansetron 8 MG tablet Commonly known as:  ZOFRAN Take 1 tablet (8 mg total) by mouth 2 (two) times daily as needed for refractory nausea / vomiting. Start on day 3 after chemotherapy.   OXALIPLATIN IV Inject into the vein. Every 2 weeks   oxyCODONE-acetaminophen 5-325 MG tablet Commonly known as:  PERCOCET/ROXICET Take 1-2 tablets by mouth every 4 (four) hours as needed for severe pain.   prochlorperazine 10 MG tablet Commonly known as:  COMPAZINE Take 1 tablet (10 mg total) by mouth every 6 (six) hours as needed (Nausea or vomiting).       Follow-up Information    Glendale Chard, MD. Schedule an appointment as soon as possible for a visit in 1 week(s).   Specialty:  Internal Medicine Contact information: [address] [place] [address] [phone]            The results of significant diagnostics from this hospitalization (including imaging, microbiology, ancillary and laboratory) are listed below for reference.    Significant Diagnostic Studies: Ct Angio Chest W/cm &/or Wo Cm  Result Date: 08/28/2016 CLINICAL DATA:  66 year old female with a history of shortness of breath EXAM: CT ANGIOGRAPHY CHEST CT ABDOMEN AND PELVIS WITH CONTRAST TECHNIQUE: Multidetector CT imaging of the chest was performed using the standard protocol during bolus administration of intravenous contrast. Multiplanar CT image reconstructions and MIPs were obtained to evaluate the  vascular anatomy. Multidetector CT imaging of the abdomen and pelvis was performed using the standard protocol during bolus administration of intravenous contrast. CONTRAST:  100 cc Isovue 370 COMPARISON:  None. FINDINGS: CTA CHEST FINDINGS Cardiovascular: Heart: No cardiomegaly. No pericardial fluid/thickening. No significant coronary calcifications. Right and left ventricles are somewhat compressed by the large left-sided diaphragmatic herniation. Estimated diameter of the left ventricle measures 20 mm with estimated diameter of the right ventricle measuring 43 mm. Aorta: Unremarkable course, caliber, contour of the thoracic aorta. No aneurysm or dissection flap. No periaortic fluid. Pulmonary arteries: Filling defects of the right-sided pulmonary arteries involving the  segmental branches of the right lower lobe, right middle lobe, right upper lobe. No filling defects within left-sided pulmonary arteries identified. Mediastinum/Nodes: Mediastinal structures somewhat compressed by the diaphragmatic hernia. Lymph nodes are present within the mediastinum, with the lowest peritracheal lymph nodes borderline enlarged. Lungs/Pleura: Right lung with no pleural effusion or confluent airspace disease. Atelectatic changes at the base of the left lung.  No pneumothorax. Small nodule within the right upper lobe (image 30 of series 6). Musculoskeletal: No displaced fracture. Degenerative changes of the spine. Review of the MIP images confirms the above findings. CT ABDOMEN and PELVIS FINDINGS Hepatobiliary: Rounded hypodense lesion of the inferior right liver measures 7 mm (image 22 of series 12) and 10 mm (image 26 of series 12). Unremarkable appearance the gallbladder. Pancreas: Unremarkable appearance of the pancreas. Spleen: Unremarkable appearance of spleen Adrenals/Urinary Tract: Unremarkable appearance the bilateral adrenal glands. No evidence of left-sided or right-sided hydronephrosis. No nephrolithiasis. No  perinephric stranding. On the right there is an ill-defined hypodense/ hypoenhancing region at the posterior/ lateral cortex of the kidney measuring 10 mm. Stomach/Bowel: Herniation of the majority of the stomach into the left chest through diaphragmatic hernia. The splenic flexure is herniated into the left chest. No evidence of abnormally distended small bowel or colon. Vascular/Lymphatic: Extensive nodularity of the mesenteric fat associated with the stomach, splenic flexure, and the mesenteric fat herniated into the left chest. Nodular tissue associated with the vasculature. There is circumferential soft tissue surrounding the left gastric artery after the origin from the celiac artery soft tissue essentially in cases the gastric artery just after the origin. Hazy infiltration of the mesenteric fat within the left upper quadrant. Enhancing peritoneal surfaces in the low abdomen/pelvis, involving nearly all the visualized at adnexa and the mesenteric fat associated with small bowel. Small volume ascites in the abdomen/pelvis. Unremarkable course caliber and contour of the abdominal aorta. No significant atherosclerotic changes. Proximal femoral vasculature patent. Nonocclusive venous thrombus within the left iliac system at the confluence of internal iliac vein in the external iliac vein. Proximal femoral veins unremarkable. Reproductive: Enhancing soft tissue nodularity associated with the right and left adnexa, with the surfaces of the broad ligaments in case with enhancing soft tissue. Other: Enhancing soft tissue within the umbilical region, likely pathologic lymph node. Musculoskeletal: No displaced fracture. No significant degenerative changes of the hips. Review of the MIP images confirms the above findings. IMPRESSION: The study is positive for right-sided pulmonary emboli involving segmental and subsegmental branches. Assessing for heart strain is limited given the distortion secondary to left-sided  diaphragmatic hernia. If there is concern for right-sided heart strain, correlation with cardiac ECHO may be useful. Left-sided diaphragmatic hernia, involving majority of the stomach, splenic flexure, and large portion of the associated mesentery. 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 Alternative site of a presumed malignancy of the left upper quadrant could be the splenic flexure. Correlation with endoscopy and possibly colonoscopy recommended. 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 Enhancing lesion within the umbilical region, likely metastatic involvement of lymph node. Small hypodense lesions of the right liver, too small to characterize and possibly benign cysts. Correlation with any future PET-CT may be useful. Low-density lesion of the lateral cortex of the right kidney.  Differential diagnosis includes kidney infarction, infection, or tumor implant. Small nodule of the right upper lobe measuring 4 mm. Correlation with future imaging recommended. Nonocclusive left iliac DVT. These results were called by telephone at the time of interpretation on 08/28/2016 at 5:52 pm to Dr. Merrily Pew , who verbally acknowledged these results. Signed, Dulcy Fanny. Earleen Newport, DO [facility] Electronically Signed   By: Corrie Mckusick D.O.   On: 08/28/2016 17:57   Ct Abdomen Pelvis W Contrast  Result Date: 08/28/2016 CLINICAL DATA:  66 year old female with a history of shortness of breath EXAM: CT ANGIOGRAPHY CHEST CT ABDOMEN AND PELVIS WITH CONTRAST TECHNIQUE: Multidetector CT imaging of the chest was performed using the standard protocol  during bolus administration of intravenous contrast. Multiplanar CT image reconstructions and MIPs were obtained to evaluate the vascular anatomy. Multidetector CT imaging of the abdomen and pelvis was performed using the standard protocol during bolus administration of intravenous contrast. CONTRAST:  100 cc Isovue 370 COMPARISON:  None. FINDINGS: CTA CHEST FINDINGS Cardiovascular: Heart: No cardiomegaly. No pericardial fluid/thickening. No significant coronary calcifications. Right and left ventricles are somewhat compressed by the large left-sided diaphragmatic herniation. Estimated diameter of the left ventricle measures 20 mm with estimated diameter of the right ventricle measuring 43 mm. Aorta: Unremarkable course, caliber, contour of the thoracic aorta. No aneurysm or dissection flap. No periaortic fluid. Pulmonary arteries: Filling defects of the right-sided pulmonary arteries involving the segmental branches of the right lower lobe, right middle lobe, right upper lobe. No filling defects within left-sided pulmonary arteries identified. Mediastinum/Nodes: Mediastinal structures somewhat compressed by the diaphragmatic hernia. Lymph nodes are present within the mediastinum, with the lowest peritracheal lymph nodes borderline enlarged. Lungs/Pleura: Right lung with no pleural effusion or confluent airspace disease. Atelectatic changes at the base of the left lung.  No pneumothorax. Small nodule within the right upper lobe (image 30 of series 6). Musculoskeletal: No displaced fracture. Degenerative changes of the spine. Review of the MIP images confirms the above findings. CT ABDOMEN and PELVIS FINDINGS Hepatobiliary: Rounded hypodense lesion of the inferior right liver measures 7 mm (image 22 of series 12) and 10 mm (image 26 of series 12). Unremarkable appearance the gallbladder. Pancreas: Unremarkable appearance of the pancreas. Spleen: Unremarkable appearance of spleen Adrenals/Urinary Tract: Unremarkable  appearance the bilateral adrenal glands. No evidence of left-sided or right-sided hydronephrosis. No nephrolithiasis. No perinephric stranding. On the right there is an ill-defined hypodense/ hypoenhancing region at the posterior/ lateral cortex of the kidney measuring 10 mm. Stomach/Bowel: Herniation of the majority of the stomach into the left chest through diaphragmatic hernia. The splenic flexure is herniated into the left chest. No evidence of abnormally distended small bowel or colon. Vascular/Lymphatic: Extensive nodularity of the mesenteric fat associated with the stomach, splenic flexure, and the mesenteric fat herniated into the left chest. Nodular tissue associated with the vasculature. There is circumferential soft tissue surrounding the left gastric artery after the origin from the celiac artery soft tissue essentially in cases the gastric artery just after the origin. Hazy infiltration of the mesenteric fat within the left upper quadrant. Enhancing peritoneal surfaces in the low abdomen/pelvis, involving nearly all the visualized at adnexa and the mesenteric fat associated with small bowel. Small volume ascites in the abdomen/pelvis. Unremarkable course caliber and contour of the abdominal aorta. No significant atherosclerotic changes. Proximal femoral vasculature patent. Nonocclusive venous thrombus within the left iliac system at the confluence of internal iliac vein in the external iliac  vein. Proximal femoral veins unremarkable. Reproductive: Enhancing soft tissue nodularity associated with the right and left adnexa, with the surfaces of the broad ligaments in case with enhancing soft tissue. Other: Enhancing soft tissue within the umbilical region, likely pathologic lymph node. Musculoskeletal: No displaced fracture. No significant degenerative changes of the hips. Review of the MIP images confirms the above findings. IMPRESSION: The study is positive for right-sided pulmonary emboli involving  segmental and subsegmental branches. Assessing for heart strain is limited given the distortion secondary to left-sided diaphragmatic hernia. If there is concern for right-sided heart strain, correlation with cardiac ECHO may be useful. Left-sided diaphragmatic hernia, involving majority of the stomach, splenic flexure, and large portion of the associated mesentery. 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 Alternative site of a presumed malignancy of the left upper quadrant could be the splenic flexure. Correlation with endoscopy and possibly colonoscopy recommended. 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 Enhancing lesion within the umbilical region, likely metastatic involvement of lymph node. Small hypodense lesions of the right liver, too small to characterize and possibly benign cysts. Correlation with any future PET-CT may be useful. Low-density lesion of the lateral cortex of the right kidney. Differential diagnosis includes kidney infarction, infection, or tumor implant. Small nodule of the right upper lobe measuring 4 mm. Correlation with future imaging recommended. Nonocclusive left iliac DVT. These results were called by telephone at the time of interpretation on 08/28/2016 at 5:52 pm to Dr. Merrily Pew , who verbally acknowledged these results. Signed, Dulcy Fanny. Earleen Newport, DO [facility] Electronically Signed   By: Corrie Mckusick D.O.   On: 08/28/2016 17:57   Ir Ivc Filter Plmt / S&i /img Guid/mod Sed  Result Date: 08/29/2016 INDICATION: 66 year old female with pulmonary embolism and left iliac DVT. Risk factor of  new malignancy. Anti coagulation contraindicated secondary to presumed gastrointestinal hemorrhage EXAM: ULTRASOUND GUIDED ACCESS RIGHT INTERNAL JUGULAR VEIN. PLACEMENT OF RETRIEVABLE IVC FILTER VIA IJ APPROACH MEDICATIONS: None. ANESTHESIA/SEDATION: 1.5 mg IV Versed; 75 mcg IV Fentanyl Moderate Sedation Time:  10 minutes The patient was continuously monitored during the procedure by the interventional radiology nurse under my direct supervision. FLUOROSCOPY TIME:  Fluoroscopy Time: 0 minutes 36 seconds (37 mGy). COMPLICATIONS: None PROCEDURE: The procedure, risks, benefits, and alternatives were explained to the patient. Specific risks discussed include bleeding, infection, contrast reaction, renal failure, IVC filter fracture, migration, ileo caval thrombus (3% incidence), need for further procedure, need for further surgery, pulmonary embolism, cardiopulmonary collapse, death. Questions regarding the procedure were encouraged and answered. The patient understands and consents to the procedure. Ultrasound survey was performed with images stored and sent to PACs. The neck was prepped with Betadine in a sterile fashion, and a sterile drape was applied covering the operative field. A sterile gown and sterile gloves were used for the procedure. Local anesthesia was provided with 1% Lidocaine. A micropuncture needle was used access the right internal jugular vein under ultrasound. With excellent venous blood flow returned, and an .018 micro wire was passed through the needle. Small incision was made with an 11 blade scalpel. The needle was removed, and a micropuncture sheath was placed over the wire. The inner dilator and wire were removed, and  an 66 Bentson wire was advanced under fluoroscopy into the IVC. Serial dilation of the soft tissue tract was performed with an 8 [place] dilator and subsequently a 10 [place] dilator. The delivery sheath for a retrievable Bard Denali filter was passed over the Bentson wire into  the IVC. The wire was removed and small contrast was used to confirm IVC location. IVC cavagram performed. Dilator was removed, and the IVC filter was then delivered, positioned below the lowest renal vein. Repeat cavagram performed, and the catheter was removed. Manual pressure was used for hemostasis. Patient tolerated the procedure well and remained hemodynamically stable throughout. No complications were encountered and no significant blood loss was encounter. IMPRESSION: Status post retrievable IVC filter placement via IJ approach. Signed, Dulcy Fanny. Earleen Newport DO [facility] Radiology PLAN: This IVC filter is potentially retrievable. The patient may be assessed for filter retrieval by Interventional Radiology in approximately 8-12 weeks. Further recommendations regarding filter retrieval, continued surveillance or declaration of device permanence, can be made at that time. Electronically Signed   By: Corrie Mckusick D.O.   On: 08/29/2016 12:18   US Venous Img Lower Bilateral  Result Date: 08/17/2016 CLINICAL DATA:  Left plantar fasciitis.  Right leg swelling. EXAM: BILATERAL LOWER EXTREMITY VENOUS DOPPLER ULTRASOUND TECHNIQUE: Gray-scale sonography with graded compression, as well as color Doppler and duplex ultrasound were performed to evaluate the lower extremity deep venous systems from the level of the common femoral vein and including the common femoral, femoral, profunda femoral, popliteal and calf veins including the posterior tibial, peroneal and gastrocnemius veins when visible. The superficial great saphenous vein was also interrogated. Spectral Doppler was utilized to evaluate flow at rest and with distal augmentation maneuvers in the common femoral, femoral and popliteal veins. COMPARISON:  No prior. FINDINGS: RIGHT LOWER EXTREMITY Common Femoral Vein: No evidence of thrombus. Normal compressibility, respiratory phasicity and response to augmentation.  Saphenofemoral Junction: No evidence of thrombus. Normal compressibility and flow on color Doppler imaging. Profunda Femoral Vein: No evidence of thrombus. Normal compressibility and flow on color Doppler imaging. Femoral Vein: No evidence of thrombus. Normal compressibility, respiratory phasicity and response to augmentation. Popliteal Vein: No evidence of thrombus. Normal compressibility, respiratory phasicity and response to augmentation. Calf Veins: No evidence of thrombus. Normal compressibility and flow on color Doppler imaging. Superficial Great Saphenous Vein: No evidence of thrombus. Normal compressibility and flow on color Doppler imaging. Other Findings:  None. LEFT LOWER EXTREMITY Common Femoral Vein: No evidence of thrombus. Normal compressibility, respiratory phasicity and response to augmentation. Saphenofemoral Junction: No evidence of thrombus. Normal compressibility and flow on color Doppler imaging. Profunda Femoral Vein: No evidence of thrombus. Normal compressibility and flow on color Doppler imaging. Femoral Vein: No evidence of thrombus. Normal compressibility, respiratory phasicity and response to augmentation. Popliteal Vein: Nonocclusive thrombus in the left popliteal vein. Calf Veins: Nonocclusive thrombus in the lead posterior tibial vein. Superficial Great Saphenous Vein: No evidence of thrombus. Normal compressibility and flow on color Doppler imaging. Other Findings:  None. IMPRESSION: Positive study for DVT left popliteal vein and left posterior tibial vein. Nonocclusive thrombus present. Right lower extremity unremarkable. Electronically Signed   By: Marcello Moores  Register   On: 08/17/2016 10:30   Ir US Guide Vasc Access Right  Result Date: 09/01/2016 INDICATION: 66 year old female with a history of gastric carcinoma EXAM: IMPLANTED PORT A CATH PLACEMENT WITH ULTRASOUND AND FLUOROSCOPIC GUIDANCE MEDICATIONS: 2.0 g Ancef; The antibiotic was administered within an appropriate time  interval prior  to skin puncture. ANESTHESIA/SEDATION: Moderate (conscious) sedation was employed during this procedure. A total of Versed 1.0 mg and Fentanyl 25 mcg was administered intravenously. Moderate Sedation Time: 15 minutes. The patient's level of consciousness and vital signs were monitored continuously by radiology nursing throughout the procedure under my direct supervision. FLUOROSCOPY TIME:  Zero minutes, 6 seconds (1 mGy) COMPLICATIONS: None PROCEDURE: The procedure, risks, benefits, and alternatives were explained to the patient. Questions regarding the procedure were encouraged and answered. The patient understands and consents to the procedure. Ultrasound survey was performed with images stored and sent to PACs. The right neck and chest was prepped with chlorhexidine, and draped in the usual sterile fashion using maximum barrier technique (cap and mask, sterile gown, sterile gloves, large sterile sheet, hand hygiene and cutaneous antiseptic). Antibiotic prophylaxis was provided with 2.0g Ancef administered IV one hour prior to skin incision. Local anesthesia was attained by infiltration with 1% lidocaine without epinephrine. Ultrasound demonstrated patency of the right internal jugular vein, and this was documented with an image. Under real-time ultrasound guidance, this vein was accessed with a 21 gauge micropuncture needle and image documentation was performed. A small dermatotomy was made at the access site with an 11 scalpel. A 0.018" wire was advanced into the SVC and used to estimate the length of the internal catheter. The access needle exchanged for a 33F micropuncture vascular sheath. The 0.018" wire was then removed and a 0.035" wire advanced into the IVC. An appropriate location for the subcutaneous reservoir was selected below the clavicle and an incision was made through the skin and underlying soft tissues. The subcutaneous tissues were then dissected using a combination of blunt and  sharp surgical technique and a pocket was formed. A single lumen power injectable portacatheter was then tunneled through the subcutaneous tissues from the pocket to the dermatotomy and the port reservoir placed within the subcutaneous pocket. The venous access site was then serially dilated and a peel away vascular sheath placed over the wire. The wire was removed and the port catheter advanced into position under fluoroscopic guidance. The catheter tip is positioned in the cavoatrial junction. This was documented with a spot image. The portacatheter was then tested and found to flush and aspirate well. The port was flushed with saline followed by 100 units/mL heparinized saline. The pocket was then closed in two layers using first subdermal inverted interrupted absorbable sutures followed by a running subcuticular suture. The epidermis was then sealed with Dermabond. The dermatotomy at the venous access site was also seal with Dermabond. Patient tolerated the procedure well and remained hemodynamically stable throughout. No complications encountered and no significant blood loss encountered IMPRESSION: Status post right IJ port catheter placement. Catheter ready for use. Signed, Dulcy Fanny. Earleen Newport, DO [facility] Radiology Electronically Signed   By: Corrie Mckusick D.O.   On: 09/01/2016 14:03   Dg Chest Port 1 View  Result Date: 09/08/2016 CLINICAL DATA:  66 year old female with a history of leg swelling EXAM: PORTABLE CHEST 1 VIEW COMPARISON:  CT 08/28/2016, port catheter 09/01/2016 FINDINGS: Cardiomediastinal silhouette unchanged with left heart border partially obscured by overlying diaphragmatic hernia. 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 No pneumothorax. No large pleural effusion. No confluent airspace disease. Retrocardiac region not well evaluated. Asymmetric elevation of the left hemidiaphragm in this  patient with known diaphragmatic hernia better demonstrated on prior CT. IMPRESSION: Re- demonstration of left-sided diaphragmatic hernia, with no definite  evidence of lobar pneumonia. 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 Electronically Signed   By: Corrie Mckusick D.O.   On: 09/08/2016 14:55   Ir Fluoro Guide Port Insertion Right  Result Date: 09/01/2016 INDICATION: 66 year old female with a history of gastric carcinoma EXAM: IMPLANTED PORT A CATH PLACEMENT WITH ULTRASOUND AND FLUOROSCOPIC GUIDANCE MEDICATIONS: 2.0 g Ancef; The antibiotic was administered within an appropriate time interval prior to skin puncture. ANESTHESIA/SEDATION: Moderate (conscious) sedation was employed during this procedure. A total of Versed 1.0 mg and Fentanyl 25 mcg was administered intravenously. Moderate Sedation Time: 15 minutes. The patient's level of consciousness and vital signs were monitored continuously by radiology nursing throughout the procedure under my direct supervision. FLUOROSCOPY TIME:  Zero minutes, 6 seconds (1 mGy) COMPLICATIONS: None PROCEDURE: The procedure, risks, benefits, and alternatives were explained to the patient. Questions regarding the procedure were encouraged and answered. The patient understands and consents to the procedure. Ultrasound survey was performed with images stored and sent to PACs. The right neck and chest was prepped with chlorhexidine, and draped in the usual sterile fashion using maximum barrier technique (cap and mask, sterile gown, sterile gloves, large sterile sheet, hand hygiene and cutaneous antiseptic). Antibiotic prophylaxis was provided with 2.0g Ancef administered IV one hour prior to skin incision. Local anesthesia was attained by infiltration with 1% lidocaine without epinephrine. Ultrasound demonstrated patency of the right internal jugular vein, and this was documented with an image. Under real-time ultrasound guidance,  this vein was accessed with a 21 gauge micropuncture needle and image documentation was performed. A small dermatotomy was made at the access site with an 11 scalpel. A 0.018" wire was advanced into the SVC and used to estimate the length of the internal catheter. The access needle exchanged for a 29F micropuncture vascular sheath. The 0.018" wire was then removed and a 0.035" wire advanced into the IVC. An appropriate location for the subcutaneous reservoir was selected below the clavicle and an incision was made through the skin and underlying soft tissues. The subcutaneous tissues were then dissected using a combination of blunt and sharp surgical technique and a pocket was formed. A single lumen power injectable portacatheter was then tunneled through the subcutaneous tissues from the pocket to the dermatotomy and the port reservoir placed within the subcutaneous pocket. The venous access site was then serially dilated and a peel away vascular sheath placed over the wire. The wire was removed and the port catheter advanced into position under fluoroscopic guidance. The catheter tip is positioned in the cavoatrial junction. This was documented with a spot image. The portacatheter was then tested and found to flush and aspirate well. The port was flushed with saline followed by 100 units/mL heparinized saline. The pocket was then closed in two layers using first subdermal inverted interrupted absorbable sutures followed by a running subcuticular suture. The epidermis was then sealed with Dermabond. The dermatotomy at the venous access site was also seal with Dermabond. Patient tolerated the procedure well and remained hemodynamically stable throughout. No complications encountered and no significant blood loss encountered IMPRESSION: Status post right IJ port catheter placement. Catheter ready for use. Signed, Dulcy Fanny. Earleen Newport, DO [facility] Radiology Electronically  Signed   By: Corrie Mckusick D.O.   On: 09/01/2016 14:03    Microbiology: Recent Results (from the past 240 hour(s))  Urine culture     Status: None   Collection Time: 09/08/16  1:41  PM  Result Value Ref Range Status   Specimen Description URINE, CATHETERIZED  Final   Special Requests NONE  Final   Culture   Final    NO GROWTH Performed at [facility], [address], [place], [place] [address]    Report Status 09/10/2016 FINAL  Final  Blood Culture (routine x 2)     Status: None (Preliminary result)   Collection Time: 09/08/16  2:12 PM  Result Value Ref Range Status   Specimen Description BLOOD LEFT HAND  Final   Special Requests   Final    Blood Culture results may not be optimal due to an inadequate volume of blood received in culture bottles   Culture NO GROWTH 3 DAYS  Final   Report Status PENDING  Incomplete  Blood Culture (routine x 2)     Status: None (Preliminary result)   Collection Time: 09/08/16  2:23 PM  Result Value Ref Range Status   Specimen Description BLOOD RIGHT HAND  Final   Special Requests   Final    Blood Culture results may not be optimal due to an inadequate volume of blood received in culture bottles   Culture NO GROWTH 3 DAYS  Final   Report Status PENDING  Incomplete     Labs: Basic Metabolic Panel:  Recent Labs Lab 09/08/16 1247 09/08/16 1834  09/09/16 7858  09/09/16 1649 09/09/16 2148 09/10/16 0156 09/10/16 0536 09/11/16 0852  NA 130* 131*  --  131*  --   --   --   --  135 135  K 6.2* 6.3*  < > 4.9  < > 5.1 5.2* 5.0 4.6 4.2  CL 99* 101  --  105  --   --   --   --  108 111  CO2 12* 12*  --  14*  --   --   --   --  15* 15*  GLUCOSE 109* 103*  --  111*  --   --   --   --  126* 117*  BUN 103* 100*  --  91*  --   --   --   --  79* 51*  CREATININE 6.76* 5.98*  --  3.93*  --   --   --   --  2.57* 1.35*  CALCIUM 7.9* 7.9*  --  7.8*  --   --   --   --  8.1* 7.9*  MG  --   --   --   --   --   --   --   --  2.3  --   < > = values in this  interval not displayed. Liver Function Tests:  Recent Labs Lab 09/06/16 1410 09/08/16 1412  AST 37 41  ALT 24 23  ALKPHOS 78 77  BILITOT 0.3 0.3  PROT 7.1 6.1*  ALBUMIN 2.4* 2.2*   No results for input(s): LIPASE, AMYLASE in the last 168 hours. No results for input(s): AMMONIA in the last 168 hours. CBC:  Recent Labs Lab 09/06/16 1410 09/08/16 1247 09/09/16 0642 09/10/16 0536  WBC 17.3* 21.4* 18.2* 18.2*  NEUTROABS 15.4* 18.9*  --   --   HGB 7.2* 5.5* 10.0* 9.6*  HCT 22.7* 17.5* 29.1* 29.1*  MCV 80.2 83.3 79.9 82.4  PLT 167 159 103* 95*   Cardiac Enzymes:  Recent Labs Lab 09/08/16 1247  TROPONINI <0.03   BNP: BNP (last 3 results)  Recent Labs  08/28/16 1547  BNP 347.0*  ProBNP (last 3 results) No results for input(s): PROBNP in the last 8760 hours.  CBG: No results for input(s): GLUCAP in the last 168 hours.

## 2016-09-11 NOTE — ED Triage Notes (Signed)
Patient discharged from hospital today and is complaining of discoloration and swelling to left lower leg and foot.

## 2016-09-11 NOTE — Care Management Note (Signed)
Case Management Note  Patient Details  Name: Jodi Collins MRN: [id number] Date of Birth: 05/25/1950  Subjective/Objective:                  Pt admitted with sepsis. Pt from home, lives with family and is ind with ADL's, no DME needs. She has PCP, is currently undergoing chemotherapy. She has no difficulty obtaining or managing medications. She has transportation to appointments. She communicates no needs.   Action/Plan: Pt discharging home today. No CM needs.   Expected Discharge Date:  09/11/16               Expected Discharge Plan:  Home/Self Care  In-House Referral:  NA  Discharge planning Services  CM Consult  Post Acute Care Choice:  NA Choice offered to:  NA  Status of Service:  Completed, signed off  Sherald Barge, RN 09/11/2016, 10:51 AM

## 2016-09-11 NOTE — Discharge Instructions (Signed)
Follow-up with your oncologist as planned, follow up with the vascular surgeon tomorrow, call the office to let them know that I spoke with Dr. Bridgett Larsson and asked for you to be seen

## 2016-09-11 NOTE — Progress Notes (Signed)
Discharge instructions gone over with patient, verbalized understanding. Printed prescription given to patient. IV's removed, patient tolerated procedure well.  

## 2016-09-11 NOTE — Progress Notes (Signed)
Pharmacy Note:  Initial antibiotics for Vancomycin and Zosyn ordered by EDP for Sepsis.  Estimated Creatinine Clearance: 36.9 mL/min (A) (by C-G formula based on SCr of 1.35 mg/dL (H)).   No Known Allergies  Vitals:   09/10/16 2238 09/11/16 0550  BP: (!) 105/57 106/63  Pulse: 95 95  Resp: 16 16  Temp: 98.5 F (36.9 C) 98.3 F (36.8 C)    Anti-infectives    Start     Dose/Rate Route Frequency Ordered Stop   09/11/16 1400  piperacillin-tazobactam (ZOSYN) IVPB 3.375 g     3.375 g 12.5 mL/hr over 240 Minutes Intravenous Every 8 hours 09/11/16 1000     09/10/16 1000  vancomycin (VANCOCIN) IVPB 1000 mg/200 mL premix  Status:  Discontinued     1,000 mg 200 mL/hr over 60 Minutes Intravenous Every 48 hours 09/09/16 0910 09/10/16 0958   09/08/16 2200  piperacillin-tazobactam (ZOSYN) 2.25 g in dextrose 5 % 50 mL IVPB  Status:  Discontinued     2.25 g 100 mL/hr over 30 Minutes Intravenous Every 8 hours 09/08/16 1657 09/11/16 0959   09/08/16 1400  vancomycin (VANCOCIN) IVPB 1000 mg/200 mL premix     1,000 mg 200 mL/hr over 60 Minutes Intravenous  Once 09/08/16 1355 09/08/16 1626   09/08/16 1400  piperacillin-tazobactam (ZOSYN) IVPB 3.375 g     3.375 g 100 mL/hr over 30 Minutes Intravenous  Once 09/08/16 1355 09/08/16 1509     A/P: Renal fxn improved Increase Zosyn to 3.375gm IV q8h, EID Monitor labs, progress, c/s  Ena Dawley, RPH 09/11/2016 10:00 AM

## 2016-09-12 ENCOUNTER — Telehealth (HOSPITAL_COMMUNITY): Payer: Self-pay | Admitting: Emergency Medicine

## 2016-09-12 ENCOUNTER — Encounter: Payer: Self-pay | Admitting: Vascular Surgery

## 2016-09-12 ENCOUNTER — Ambulatory Visit (INDEPENDENT_AMBULATORY_CARE_PROVIDER_SITE_OTHER): Payer: 59 | Admitting: Vascular Surgery

## 2016-09-12 ENCOUNTER — Encounter: Payer: Self-pay | Admitting: *Deleted

## 2016-09-12 VITALS — BP 105/66 | HR 106 | Temp 96.5°F | Resp 20 | Ht 62.0 in | Wt 140.0 lb

## 2016-09-12 DIAGNOSIS — I998 Other disorder of circulatory system: Secondary | ICD-10-CM

## 2016-09-12 MED ORDER — MISC. DEVICES MISC: 0 refills | Status: AC

## 2016-09-12 MED ORDER — MISC. DEVICES MISC: 0 refills | Status: DC

## 2016-09-12 NOTE — Progress Notes (Signed)
HISTORY AND PHYSICAL     CC:  Leg swelling and skin cyanosis Requesting Provider:  Glendale Chard, MD  HPI: This is a 66 y.o. female who was diagnosed with a DVT in the LLE in May 2018 and PE on recent admission this month.  She was on Xarelto, but this was discontinued due to GIB.  She had an IVC filter placed on 08/29/16.  She was diagnosed with gastric cancer this month after an EGD.  She was scheduled to have a blood transfusion, but this was deferred and she was sent to the ER.  She was septic upon admission with the source thought to be UTI.  She was on Vanc and Zosyn.  She subsequently received 3 units PRBC's.   Her creatinine on 6/11 was 1.14 and on 6/22 it was as high as 6.76. Yesterday on her labs, her creatinine had improved to 1.24.  She still have a persistent leukocytosis of 19k and thrombocytopenia with platelets at 109k (improved).   She is to start chemo tomorrow and will get chemo every couple of weeks.  This will take place in [place].   Pt was discharged from the hospital yesterday.  Her daughters flew in from separate states and the pt was complaining of pain in her left leg/foot.  She had her TED hose on, which had not been removed.  The daughter spoke to the nurse and was told to bring her back to the ER.  At that time, it was noted that her toes were dark in color and foot swollen. Daughter states that some of the fluid was drained and relieved some of the swelling.    Pt states that she is not having a great deal of pain and that she has only taken 3 or 4 pain pills in anticipation of when the pain will start.  Her daughter states that she has been having a hard time standing.   She is referred urgently to [facility] for evaluation after discussion with Dr. Bridgett Larsson.    She is no longer on anticoagulation.    Past Medical History:  Diagnosis Date  . Anemia   . DVT (deep venous thrombosis) ([place])   . Stomach cancer [facility])     Past Surgical History:  Procedure Laterality Date  .  CERVICAL CONIZATION W/BX  2012   high grade squamous intraepithelial dysplasia.  Dr Garwin Brothers.   . COLONOSCOPY W/ POLYPECTOMY  10/2006 and 10/2011   Dr Collene Mares.  had adenomatous polyps.    . ESOPHAGOGASTRODUODENOSCOPY N/A 08/30/2016   Procedure: ESOPHAGOGASTRODUODENOSCOPY (EGD);  Surgeon: Carol Ada, MD;  Location: [facility];  Service: Gastroenterology;  Laterality: N/A;  . IR FLUORO GUIDE PORT INSERTION RIGHT  09/01/2016  . IR IVC FILTER PLMT / S&I /IMG GUID/MOD SED  08/29/2016  . IR US GUIDE VASC ACCESS RIGHT  09/01/2016    No Known Allergies  Current Outpatient Prescriptions  Medication Sig Dispense Refill  . ciprofloxacin (CIPRO) 500 MG tablet Take 1 tablet (500 mg total) by mouth 2 (two) times daily. 10 tablet 0  . dexamethasone (DECADRON) 4 MG tablet Take 2 tablets (8 mg total) by mouth daily. Start the day after chemotherapy for 2 days. Take with food. 30 tablet 1  . fluorouracil CALGB 40347 in sodium chloride 0.9 % 150 mL Inject into the vein over 96 hr. Every 2 weeks, wears pump for 46 hours    . leucovorin in dextrose 5 % 250 mL Inject into the vein once. Every 2 weeks    .  lidocaine-prilocaine (EMLA) cream Apply to affected area once 30 g 3  . Misc. Devices MISC Wheelchair 1 each 0  . ondansetron (ZOFRAN) 8 MG tablet Take 1 tablet (8 mg total) by mouth 2 (two) times daily as needed for refractory nausea / vomiting. Start on day 3 after chemotherapy. 30 tablet 1  . OXALIPLATIN IV Inject into the vein. Every 2 weeks    . oxyCODONE-acetaminophen (PERCOCET/ROXICET) 5-325 MG tablet Take 1-2 tablets by mouth every 4 (four) hours as needed for severe pain. 90 tablet 0  . polyethylene glycol powder (GLYCOLAX/MIRALAX) powder 1 capful daily as needed 255 g 2  . prochlorperazine (COMPAZINE) 10 MG tablet Take 1 tablet (10 mg total) by mouth every 6 (six) hours as needed (Nausea or vomiting). 30 tablet 1   No current facility-administered medications for this visit.     Family History    Problem Relation Age of Onset  . Hypertension Mother   . Diabetes Mother     Social History   Social History  . Marital status: Legally Separated    Spouse name: N/A  . Number of children: N/A  . Years of education: N/A   Occupational History  . Not on file.   Social History Main Topics  . Smoking status: Never Smoker  . Smokeless tobacco: Never Used  . Alcohol use Yes     Comment: rarely  . Drug use: No  . Sexual activity: Not Currently   Other Topics Concern  . Not on file   Social History Narrative  . No narrative on file     REVIEW OF SYSTEMS:   [X]  denotes positive finding, [ ]  denotes negative finding Cardiac  Comments:  Chest pain or chest pressure: x   Shortness of breath upon exertion: x   Short of breath when lying flat:    Irregular heart rhythm: x With activity      Vascular    Pain in calf, thigh, or hip brought on by ambulation: x   Pain in feet at night that wakes you up from your sleep:  x   Blood clot in your veins: x   Leg swelling:  x       Pulmonary    Oxygen at home:    Productive cough:     Wheezing:         Neurologic    Sudden weakness in legs:  x Left foot  Sudden numbness in  legs:  x Left foot  Sudden onset of difficulty speaking or slurred speech:    Temporary loss of vision in one eye:     Problems with dizziness:         Gastrointestinal    Blood in stool:  x   Vomited blood:         Genitourinary    Burning when urinating:     Blood in urine:        Psychiatric    Major depression:         Hematologic    Bleeding problems:    Problems with blood clotting too easily:        Skin    Rashes or ulcers:        Constitutional    Fever or chills:      PHYSICAL EXAMINATION:  Vitals:   09/12/16 1406  BP: 105/66  Pulse: (!) 106  Resp: 20  Temp: (!) 96.5 F (35.8 C)   Body mass index is 25.61 kg/m.  General:  WDWN in NAD; vital signs documented above Gait: Not observed HENT: WNL,  normocephalic Pulmonary: normal non-labored breathing , without Rales, rhonchi,  wheezing Cardiac: regular HR, without  Murmurs, rubs or gallops; without carotid bruits Abdomen: soft, NT, no masses Vascular Exam/Pulses:  Right Left  Radial 2+ (normal) 2+ (normal)  DP 1+ (weak) absent  PT + doppler signal +doppler signal  Peroneal + doppler signal +doppler signal   Extremities: left foot with darkened toes and epidermolysis on the forefoot.  Left foot is cold to touch.  Right foot is warm.  BLE with left > right. Musculoskeletal: no muscle wasting or atrophy  Neurologic: A&O X 3;  No focal weakness or paresthesias are detected Psychiatric:  The pt has Normal affect.   Non-Invasive Vascular Imaging:   2D echo 08/29/16: Study Conclusions - Left ventricle: The cavity size was normal. Systolic function was   normal. The estimated ejection fraction was in the range of 60%   to 65%. Wall motion was normal; there were no regional wall   motion abnormalities. Left ventricular diastolic function   parameters were normal. - Atrial septum: No defect or patent foramen ovale was identified.  CT abdomen/pelvis 08/28/16: IMPRESSION: The study is positive for right-sided pulmonary emboli involving segmental and subsegmental branches. Assessing for heart strain is limited given the distortion secondary to left-sided diaphragmatic hernia. If there is concern for right-sided heart strain, correlation with cardiac ECHO may be useful.  Left-sided diaphragmatic hernia, involving majority of the stomach, splenic flexure, and large portion of the associated mesentery.  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 Alternative site of a presumed malignancy of the left upper quadrant could be the splenic flexure. Correlation with endoscopy and possibly  colonoscopy recommended.  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  Enhancing lesion within the umbilical region, likely metastatic involvement of lymph node.  Small hypodense lesions of the right liver, too small to characterize and possibly benign cysts. Correlation with any future PET-CT may be useful.  Low-density lesion of the lateral cortex of the right kidney. Differential diagnosis includes kidney infarction, infection, or tumor implant.  Small nodule of the right upper lobe measuring 4 mm. Correlation with future imaging recommended.  Nonocclusive left iliac DVT. Pt meds includes: Statin:  No. Beta Blocker:  No. Aspirin:  No. ACEI:  No. ARB:  No. CCB use:  No Other Antiplatelet/Anticoagulant:  No   ASSESSMENT/PLAN:: 66 y.o. female with recently diagnosed gastric cancer and LLE DVT with PE    -pt has been off anticoagulation due to GIB.  Her toes are necrotic and she has epidermolysis of the forefoot of the left foot.  She does not have adequate blood flow to the left foot as she does not have doppler signals below the level of the ankle and will need a left below knee amputation.  Dr. Donnetta Hutching had lengthy discussion with pt and her daughters.  She is supposed to start chemo tomorrow, which should not be affected.  She and her daughters are going to go home and discuss with family and the oncology navigator and decide a plan on when to proceed with surgery, most likely later this week.   -She will be admitted to the hospitalist service and  oncology will be consulted at that time.  She may need CIR post operatively or a SNF depending on post operative evaluation.     Leontine Locket, PA-C [facility] [phone]  Clinic MD:  Pt seen and examined with Dr.  Donnetta Hutching  Very complicated problem as outlined above. Unclear as to the cause of her profound foot ischemia. Her toes are progressing to dry gangrenous changes with black color and beginning to demarcate. Epidermal lysis of the skin over the entire dorsum of her foot. Explain only option would be below-knee amputation. She has loud signal in her posterior tibial at the ankle but does sound to be consistent with distal occlusion. Clearly hypercoagulable related to her gastric cancer with multiple DVT and pulmonary embolus. Unclear as to why she had thrombosis of her foot. Explain her only option is below-knee amputation. She does have marked swelling in her calf which may make healing somewhat difficult. She is not willing to be admitted for surgery at this time. Will speak with the oncology team and initiate chemotherapy tomorrow which should have no bearing regarding her foot. Will notify when she wishes to be admitted to [facility] hospital for amputation

## 2016-09-12 NOTE — Progress Notes (Signed)
[place] Clinical Social Work  Clinical Social Work was referred by patient navigator for assessment of psychosocial needs due to new cancer diagnosis. Clinical Social Worker attempted to contact patient via phone to offer support and assess for needs.  CSW left supportive message where CSW introduced self, explained role of CSW/Pt and Family Support Team, support groups and other resources to assist. CSW provided contact and encouraged return call.      Clinical Social Work interventions: Resource education Check in  Jodi Collins, Jodi Collins, Jodi Collins [facility] Tuesdays   [phone]

## 2016-09-12 NOTE — Telephone Encounter (Signed)
Called sister, sister has been in contact with vascular doctor trying to make an appt for them to access her foot.  Dr Talbert Cage wants to see if possibly the clot can be removed before we start chemo.  If this is going to take a couple of weeks then we need to start chemo.  The sister is going to call back and give me an update.

## 2016-09-13 ENCOUNTER — Encounter (HOSPITAL_COMMUNITY): Payer: 59

## 2016-09-13 ENCOUNTER — Telehealth (HOSPITAL_COMMUNITY): Payer: Self-pay | Admitting: Oncology

## 2016-09-13 ENCOUNTER — Ambulatory Visit (HOSPITAL_COMMUNITY): Payer: 59

## 2016-09-13 ENCOUNTER — Other Ambulatory Visit (HOSPITAL_COMMUNITY): Payer: Self-pay | Admitting: Pharmacist

## 2016-09-13 ENCOUNTER — Inpatient Hospital Stay (HOSPITAL_COMMUNITY): Payer: 59

## 2016-09-13 ENCOUNTER — Telehealth (HOSPITAL_COMMUNITY): Payer: Self-pay | Admitting: Emergency Medicine

## 2016-09-13 LAB — CULTURE, BLOOD (ROUTINE X 2)
CULTURE: NO GROWTH
Culture: NO GROWTH

## 2016-09-13 NOTE — Telephone Encounter (Signed)
I spoke with patient's sister Seth Bake regarding patient's care. The patient went to see vascular surgery yesterday to see Dr Donnetta Hutching. 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  She does not have adequate blood flow to the left foot as she does not have doppler signals below the level of the ankle and will need a left below knee amputation. " I discussed with Seth Bake that unfortunately we were not able to anticoagulate her for her left lower extremity DVT due to her ongoing issues with the GI bleed from her gastric cancer, which have been requiring multiple blood transfusions. She continues to have bleeding from her gastric cancer even off of anticoagulation as seen by her progressively declining hemoglobin. I have discussed in detail that the patient has a PE and DVT, however her ongoing bleeding is a major contraindication to resuming Xarelto. I have discussed with her that overall the patient has a very poor prognosis. She has stage IV gastric cancer and I went over her CT scan results with her. Patient was supposed to start chemotherapy yesterday however this has been put on hold due to her vascular issues in her left leg. At this point I have recommended for patient to proceed with below the knee amputation with vascular surgery. However I have stated that this will delay her chemotherapy by at least another 4-6 weeks in order to allow for adequate wound healing post surgery. I have stated that I am concerned that patient may never get to the point where she can receive chemotherapy and that hospice may be a better option, and the patient's sister verbalized understanding and said that she has had similar thoughts. I have asked Seth Bake to discuss with the patient and her family the goals of care, since she will likely have better quality of life off chemotherapy and may enjoy her time with her family more for whatever time she has left. I have discussed  with Seth Bake, that without starting chemotherapy, the patient likely only has a few months if not less. I have answered all of Andrea's questions to her satisfaction. I have told her she may call us back at anytime should she have further questions.  Twana First, MD

## 2016-09-13 NOTE — Telephone Encounter (Signed)
Late encounter- Spoke with sister who has been trying to get an appt with the vascular Dr (dr Bridgett Larsson).  She will call and update [place] as soon as she knows any information.     Sister had called back to let me know that they had seen the vascular doctor.  Reviewed the information with Dr Talbert Cage.     Spoke with sister andrea yesterday afternoon and notified them that Dr Talbert Cage did not want to precede with chemotherapy until pt had her amputation.  I let the family know that Dr Talbert Cage would be in touch with them in the morning to talk with them.  They verbalized understanding.

## 2016-09-14 ENCOUNTER — Telehealth (HOSPITAL_COMMUNITY): Payer: Self-pay | Admitting: Emergency Medicine

## 2016-09-14 ENCOUNTER — Encounter (HOSPITAL_COMMUNITY): Payer: Self-pay | Admitting: Emergency Medicine

## 2016-09-14 ENCOUNTER — Other Ambulatory Visit: Payer: Self-pay

## 2016-09-14 NOTE — Telephone Encounter (Signed)
Sister sylvia called and they can not find the prescription cipro that she is suppose to be on.  It looks like it was printed on 09/11/2016 on the day they were discharged from the hospital.  They do not have this script.  I went to talk this over with Dr Talbert Cage. She reviewed the chart and blood cultures.  She said it was ok for her not to take this prescription.  Family wanted to know what to do if she got "warm", she can take ibuprofen every 6 hours just have a little food on her stomach with this.  She is having a hard time swallowing pills.  They are going to crush her pain medication and put in pudding.  I offered to write them for liquid pain medication and she said she would try crushing the medication right now.  The sister seems very overwhelmed with everything.

## 2016-09-14 NOTE — Progress Notes (Signed)
Letter written for sister andrea so hopefully they can get their flights changed.  Provided them with information about the wheelchair.  They were very thankful.

## 2016-09-15 ENCOUNTER — Encounter (HOSPITAL_COMMUNITY): Payer: 59

## 2016-09-15 ENCOUNTER — Encounter (HOSPITAL_COMMUNITY): Payer: Self-pay | Admitting: Emergency Medicine

## 2016-09-15 NOTE — Progress Notes (Signed)
Pt SDW-pre-op call completed by both pt and pt sister Seth Bake. Sister denies that pt C/O any acute cardiopulmonary issues. Pt denies having a stress test and cardiac cath. Sister made aware to have pt stop taking vitamins and herbal medications. Sister verbalized understanding of all pre-op instructions. See anesthesia note.

## 2016-09-15 NOTE — Progress Notes (Signed)
Anesthesia Chart Review:  Pt is a same day work up.   Pt is a 66 year old female scheduled for left BKA on 09/18/2016 with Ruta Hinds, M.D.   [facility] includes: PE (08/28/16), DVT (08/17/16), gastric cancer (stage IV, newly diagnosed 08/30/16), anemia, CKD (stage III)  - ED visit 09/11/16 (evening of hospital discharge) for left foot pain and cyanosis. Thought to be due to venous insufficiency from DVT (not on anticoagulation due to GI bleed).   - Hospitalized 6/22-25/18 for sepsis, PE (IVC filter 08/29/16), DVT, anemia of chronic disease/acute blood loss anemia (S/P 3 units PRBCs), acute renal failure.  - Hospitalized 6/11-15/18 for acute hypoxemic respiratory failure due to PE.  Complicated by GI bleed, profound anemia (s/p 4 units PRBCs). New dx stage IV gastric cancer 08/30/16  Labs will be obtained DOS  1 view CXR 09/08/16:  1. Re- demonstration of left-sided diaphragmatic hernia, with no definite evidence of lobar pneumonia. 2. 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  EKG 09/08/16: NSR  Echo 08/29/16:  - Left ventricle: The cavity size was normal. Systolic function was normal. The estimated ejection fraction was in the range of 60% to 65%. Wall motion was normal; there were no regional wall motion abnormalities. Left ventricular diastolic function parameters were normal. - Atrial septum: No defect or patent foramen ovale was identified.  Pt will need further assessment by assigned anesthesiologist DOS.   Willeen Cass, FNP-BC [facility]/Anesthesiology Phone: [phone] 09/15/2016 3:31 PM

## 2016-09-18 ENCOUNTER — Other Ambulatory Visit (HOSPITAL_COMMUNITY): Payer: Self-pay | Admitting: Pharmacist

## 2016-09-18 ENCOUNTER — Inpatient Hospital Stay (HOSPITAL_COMMUNITY)
Admission: RE | Admit: 2016-09-18 | Discharge: 2016-09-21 | DRG: 240 | Disposition: A | Discharge status: Rehabilitation Facility | Payer: 59 | Source: Ambulatory Visit | Attending: Internal Medicine | Admitting: Internal Medicine

## 2016-09-18 ENCOUNTER — Encounter (HOSPITAL_COMMUNITY): Payer: Self-pay | Admitting: *Deleted

## 2016-09-18 ENCOUNTER — Inpatient Hospital Stay (HOSPITAL_COMMUNITY): Payer: 59

## 2016-09-18 ENCOUNTER — Inpatient Hospital Stay (HOSPITAL_COMMUNITY): Payer: 59 | Admitting: Emergency Medicine

## 2016-09-18 ENCOUNTER — Encounter (HOSPITAL_COMMUNITY)
Admission: RE | Disposition: A | Discharge status: Rehabilitation Facility | Payer: Self-pay | Source: Ambulatory Visit | Attending: Internal Medicine

## 2016-09-18 DIAGNOSIS — M6289 Other specified disorders of muscle: Secondary | ICD-10-CM

## 2016-09-18 DIAGNOSIS — Z86718 Personal history of other venous thrombosis and embolism: Secondary | ICD-10-CM | POA: Diagnosis not present

## 2016-09-18 DIAGNOSIS — G8918 Other acute postprocedural pain: Secondary | ICD-10-CM

## 2016-09-18 DIAGNOSIS — N179 Acute kidney failure, unspecified: Secondary | ICD-10-CM

## 2016-09-18 DIAGNOSIS — D696 Thrombocytopenia, unspecified: Secondary | ICD-10-CM

## 2016-09-18 DIAGNOSIS — R601 Generalized edema: Secondary | ICD-10-CM | POA: Diagnosis not present

## 2016-09-18 DIAGNOSIS — Z89612 Acquired absence of left leg above knee: Secondary | ICD-10-CM | POA: Diagnosis not present

## 2016-09-18 DIAGNOSIS — Z89619 Acquired absence of unspecified leg above knee: Secondary | ICD-10-CM | POA: Diagnosis not present

## 2016-09-18 DIAGNOSIS — R197 Diarrhea, unspecified: Secondary | ICD-10-CM | POA: Diagnosis not present

## 2016-09-18 DIAGNOSIS — I96 Gangrene, not elsewhere classified: Secondary | ICD-10-CM | POA: Diagnosis present

## 2016-09-18 DIAGNOSIS — E871 Hypo-osmolality and hyponatremia: Secondary | ICD-10-CM | POA: Diagnosis present

## 2016-09-18 DIAGNOSIS — I2609 Other pulmonary embolism with acute cor pulmonale: Secondary | ICD-10-CM | POA: Diagnosis not present

## 2016-09-18 DIAGNOSIS — F4323 Adjustment disorder with mixed anxiety and depressed mood: Secondary | ICD-10-CM | POA: Diagnosis not present

## 2016-09-18 DIAGNOSIS — R739 Hyperglycemia, unspecified: Secondary | ICD-10-CM | POA: Diagnosis not present

## 2016-09-18 DIAGNOSIS — N183 Chronic kidney disease, stage 3 unspecified: Secondary | ICD-10-CM

## 2016-09-18 DIAGNOSIS — R600 Localized edema: Secondary | ICD-10-CM | POA: Diagnosis not present

## 2016-09-18 DIAGNOSIS — D500 Iron deficiency anemia secondary to blood loss (chronic): Secondary | ICD-10-CM | POA: Diagnosis not present

## 2016-09-18 DIAGNOSIS — E875 Hyperkalemia: Secondary | ICD-10-CM | POA: Diagnosis present

## 2016-09-18 DIAGNOSIS — C169 Malignant neoplasm of stomach, unspecified: Secondary | ICD-10-CM

## 2016-09-18 DIAGNOSIS — I82402 Acute embolism and thrombosis of unspecified deep veins of left lower extremity: Secondary | ICD-10-CM | POA: Diagnosis present

## 2016-09-18 DIAGNOSIS — Z79899 Other long term (current) drug therapy: Secondary | ICD-10-CM

## 2016-09-18 DIAGNOSIS — K922 Gastrointestinal hemorrhage, unspecified: Secondary | ICD-10-CM | POA: Diagnosis not present

## 2016-09-18 DIAGNOSIS — J810 Acute pulmonary edema: Secondary | ICD-10-CM | POA: Diagnosis not present

## 2016-09-18 DIAGNOSIS — Z7901 Long term (current) use of anticoagulants: Secondary | ICD-10-CM

## 2016-09-18 DIAGNOSIS — Z86711 Personal history of pulmonary embolism: Secondary | ICD-10-CM | POA: Diagnosis not present

## 2016-09-18 DIAGNOSIS — Z85028 Personal history of other malignant neoplasm of stomach: Secondary | ICD-10-CM

## 2016-09-18 DIAGNOSIS — R339 Retention of urine, unspecified: Secondary | ICD-10-CM | POA: Diagnosis not present

## 2016-09-18 DIAGNOSIS — D62 Acute posthemorrhagic anemia: Secondary | ICD-10-CM | POA: Diagnosis not present

## 2016-09-18 DIAGNOSIS — R180 Malignant ascites: Secondary | ICD-10-CM | POA: Diagnosis not present

## 2016-09-18 DIAGNOSIS — K921 Melena: Secondary | ICD-10-CM | POA: Diagnosis present

## 2016-09-18 DIAGNOSIS — I82492 Acute embolism and thrombosis of other specified deep vein of left lower extremity: Secondary | ICD-10-CM | POA: Diagnosis not present

## 2016-09-18 DIAGNOSIS — I2782 Chronic pulmonary embolism: Secondary | ICD-10-CM | POA: Diagnosis not present

## 2016-09-18 DIAGNOSIS — J9601 Acute respiratory failure with hypoxia: Secondary | ICD-10-CM | POA: Diagnosis not present

## 2016-09-18 DIAGNOSIS — D72829 Elevated white blood cell count, unspecified: Secondary | ICD-10-CM | POA: Diagnosis not present

## 2016-09-18 DIAGNOSIS — D649 Anemia, unspecified: Secondary | ICD-10-CM | POA: Diagnosis present

## 2016-09-18 HISTORY — DX: Hypo-osmolality and hyponatremia: E87.1

## 2016-09-18 HISTORY — DX: Other pulmonary embolism without acute cor pulmonale: I26.99

## 2016-09-18 HISTORY — PX: AMPUTATION: SHX166

## 2016-09-18 HISTORY — DX: Gastrointestinal hemorrhage, unspecified: K92.2

## 2016-09-18 HISTORY — DX: Hyperkalemia: E87.5

## 2016-09-18 HISTORY — DX: Thrombocytopenia, unspecified: D69.6

## 2016-09-18 HISTORY — DX: Gangrene, not elsewhere classified: I96

## 2016-09-18 LAB — COMPREHENSIVE METABOLIC PANEL
ALT: 30 U/L (ref 14–54)
ANION GAP: 11 (ref 5–15)
AST: 36 U/L (ref 15–41)
Albumin: 2 g/dL — ABNORMAL LOW (ref 3.5–5.0)
Alkaline Phosphatase: 111 U/L (ref 38–126)
BILIRUBIN TOTAL: 0.5 mg/dL (ref 0.3–1.2)
BUN: 41 mg/dL — ABNORMAL HIGH (ref 6–20)
CALCIUM: 8.6 mg/dL — AB (ref 8.9–10.3)
CHLORIDE: 104 mmol/L (ref 101–111)
CO2: 16 mmol/L — AB (ref 22–32)
Creatinine, Ser: 1.35 mg/dL — ABNORMAL HIGH (ref 0.44–1.00)
GFR calc Af Amer: 47 mL/min — ABNORMAL LOW (ref >60)
GFR calc non Af Amer: 41 mL/min — ABNORMAL LOW (ref >60)
GLUCOSE: 117 mg/dL — AB (ref 65–99)
Potassium: 6 mmol/L — ABNORMAL HIGH (ref 3.5–5.1)
Sodium: 131 mmol/L — ABNORMAL LOW (ref 135–145)
Total Protein: 6.4 g/dL — ABNORMAL LOW (ref 6.5–8.1)

## 2016-09-18 LAB — BASIC METABOLIC PANEL
Anion gap: 8 (ref 5–15)
BUN: 41 mg/dL — ABNORMAL HIGH (ref 6–20)
CALCIUM: 8.1 mg/dL — AB (ref 8.9–10.3)
CO2: 19 mmol/L — ABNORMAL LOW (ref 22–32)
Chloride: 105 mmol/L (ref 101–111)
Creatinine, Ser: 1.35 mg/dL — ABNORMAL HIGH (ref 0.44–1.00)
GFR, EST AFRICAN AMERICAN: 47 mL/min — AB (ref >60)
GFR, EST NON AFRICAN AMERICAN: 41 mL/min — AB (ref >60)
GLUCOSE: 127 mg/dL — AB (ref 65–99)
POTASSIUM: 5.4 mmol/L — AB (ref 3.5–5.1)
SODIUM: 132 mmol/L — AB (ref 135–145)

## 2016-09-18 LAB — CBC
HCT: 25.9 % — ABNORMAL LOW (ref 36.0–46.0)
HEMATOCRIT: 21.9 % — AB (ref 36.0–46.0)
Hemoglobin: 6.7 g/dL — CL (ref 12.0–15.0)
Hemoglobin: 8.5 g/dL — ABNORMAL LOW (ref 12.0–15.0)
MCH: 25.8 pg — AB (ref 26.0–34.0)
MCH: 28.3 pg (ref 26.0–34.0)
MCHC: 30.6 g/dL (ref 30.0–36.0)
MCHC: 32.8 g/dL (ref 30.0–36.0)
MCV: 84.2 fL (ref 78.0–100.0)
MCV: 86.3 fL (ref 78.0–100.0)
Platelets: 96 10*3/uL — ABNORMAL LOW (ref 150–400)
Platelets: 64 10*3/uL — ABNORMAL LOW (ref 150–400)
RBC: 2.6 MIL/uL — ABNORMAL LOW (ref 3.87–5.11)
RBC: 3 MIL/uL — AB (ref 3.87–5.11)
RDW: 18.7 % — AB (ref 11.5–15.5)
RDW: 15.9 % — AB (ref 11.5–15.5)
WBC: 23.1 10*3/uL — AB (ref 4.0–10.5)
WBC: 23.6 10*3/uL — ABNORMAL HIGH (ref 4.0–10.5)

## 2016-09-18 LAB — PROTIME-INR
INR: 1.54
Prothrombin Time: 18.6 seconds — ABNORMAL HIGH (ref 11.4–15.2)

## 2016-09-18 LAB — GLUCOSE, CAPILLARY: GLUCOSE-CAPILLARY: 130 mg/dL — AB (ref 65–99)

## 2016-09-18 LAB — APTT: aPTT: 37 seconds — ABNORMAL HIGH (ref 24–36)

## 2016-09-18 LAB — PREPARE RBC (CROSSMATCH)

## 2016-09-18 SURGERY — AMPUTATION, ABOVE KNEE
Anesthesia: General | Site: Leg Upper | Laterality: Left

## 2016-09-18 MED ORDER — SENNOSIDES-DOCUSATE SODIUM 8.6-50 MG PO TABS: 1.0000 | ORAL_TABLET | Freq: Every evening | ORAL | Status: DC | PRN

## 2016-09-18 MED ORDER — POLYETHYLENE GLYCOL 3350 17 G PO PACK
17.0000 g | PACK | Freq: Every day | ORAL | Status: DC
Start: 2016-09-18 — End: 2016-09-20
  Administered 2016-09-19: 17 g via ORAL
  Filled 2016-09-18: qty 1

## 2016-09-18 MED ORDER — HYDROCODONE-ACETAMINOPHEN 5-325 MG PO TABS
1.0000 | ORAL_TABLET | ORAL | Status: DC | PRN
  Administered 2016-09-18 – 2016-09-20 (×5): 2 via ORAL
  Filled 2016-09-18 (×5): qty 2

## 2016-09-18 MED ORDER — FENTANYL CITRATE (PF) 250 MCG/5ML IJ SOLN
INTRAMUSCULAR | Status: AC
  Filled 2016-09-18: qty 5

## 2016-09-18 MED ORDER — SODIUM CHLORIDE 0.9% FLUSH
3.0000 mL | Freq: Two times a day (BID) | INTRAVENOUS | Status: DC
  Administered 2016-09-20 – 2016-09-21 (×2): 3 mL via INTRAVENOUS

## 2016-09-18 MED ORDER — FENTANYL CITRATE (PF) 100 MCG/2ML IJ SOLN
INTRAMUSCULAR | Status: DC | PRN
  Administered 2016-09-18: 25 ug via INTRAVENOUS
  Administered 2016-09-18 (×2): 50 ug via INTRAVENOUS

## 2016-09-18 MED ORDER — MIDAZOLAM HCL 2 MG/2ML IJ SOLN
INTRAMUSCULAR | Status: AC
  Filled 2016-09-18: qty 2

## 2016-09-18 MED ORDER — LACTATED RINGERS IV SOLN
INTRAVENOUS | Status: DC
  Administered 2016-09-18 – 2016-09-20 (×3): via INTRAVENOUS

## 2016-09-18 MED ORDER — MORPHINE SULFATE (PF) 4 MG/ML IV SOLN
INTRAVENOUS | Status: AC
  Administered 2016-09-18: 1 mg via INTRAVENOUS
  Filled 2016-09-18: qty 1

## 2016-09-18 MED ORDER — POLYETHYLENE GLYCOL 3350 17 GM/SCOOP PO POWD: 17.0000 g | Freq: Every day | ORAL | Status: DC

## 2016-09-18 MED ORDER — FUROSEMIDE 10 MG/ML IJ SOLN
20.0000 mg | INTRAMUSCULAR | Status: AC
  Administered 2016-09-18: 20 mg via INTRAVENOUS
  Filled 2016-09-18: qty 2

## 2016-09-18 MED ORDER — INSULIN ASPART 100 UNIT/ML ~~LOC~~ SOLN: 0.0000 [IU] | Freq: Every day | SUBCUTANEOUS | Status: DC

## 2016-09-18 MED ORDER — INSULIN ASPART 100 UNIT/ML ~~LOC~~ SOLN
0.0000 [IU] | Freq: Three times a day (TID) | SUBCUTANEOUS | Status: DC
  Administered 2016-09-19 (×2): 1 [IU] via SUBCUTANEOUS

## 2016-09-18 MED ORDER — PHENYLEPHRINE HCL 10 MG/ML IJ SOLN
INTRAMUSCULAR | Status: DC | PRN
  Administered 2016-09-18 (×2): 80 ug via INTRAVENOUS
  Administered 2016-09-18: 40 ug via INTRAVENOUS
  Administered 2016-09-18 (×5): 120 ug via INTRAVENOUS

## 2016-09-18 MED ORDER — MORPHINE SULFATE (PF) 4 MG/ML IV SOLN
1.0000 mg | INTRAVENOUS | Status: DC | PRN
  Administered 2016-09-18 (×4): 1 mg via INTRAVENOUS

## 2016-09-18 MED ORDER — SUGAMMADEX SODIUM 200 MG/2ML IV SOLN
INTRAVENOUS | Status: DC | PRN
  Administered 2016-09-18: 150 mg via INTRAVENOUS

## 2016-09-18 MED ORDER — CHLORHEXIDINE GLUCONATE CLOTH 2 % EX PADS: 6.0000 | MEDICATED_PAD | Freq: Once | CUTANEOUS | Status: DC

## 2016-09-18 MED ORDER — DEXTROSE 5 % IV SOLN
1.5000 g | Freq: Two times a day (BID) | INTRAVENOUS | Status: AC
  Administered 2016-09-18 – 2016-09-19 (×2): 1.5 g via INTRAVENOUS
  Filled 2016-09-18 (×2): qty 1.5

## 2016-09-18 MED ORDER — ROCURONIUM BROMIDE 100 MG/10ML IV SOLN
INTRAVENOUS | Status: DC | PRN
  Administered 2016-09-18: 40 mg via INTRAVENOUS

## 2016-09-18 MED ORDER — DEXTROSE 5 % IV SOLN
INTRAVENOUS | Status: AC
  Filled 2016-09-18: qty 1.5

## 2016-09-18 MED ORDER — SODIUM CHLORIDE 0.9 % IV SOLN: INTRAVENOUS | Status: AC

## 2016-09-18 MED ORDER — PROMETHAZINE HCL 25 MG/ML IJ SOLN: 6.2500 mg | INTRAMUSCULAR | Status: DC | PRN

## 2016-09-18 MED ORDER — ALBUMIN HUMAN 5 % IV SOLN
INTRAVENOUS | Status: DC | PRN
  Administered 2016-09-18 (×2): via INTRAVENOUS

## 2016-09-18 MED ORDER — SODIUM CHLORIDE 0.9 % IV SOLN
Freq: Once | INTRAVENOUS | Status: AC
  Administered 2016-09-18: 14:00:00 via INTRAVENOUS

## 2016-09-18 MED ORDER — 0.9 % SODIUM CHLORIDE (POUR BTL) OPTIME
TOPICAL | Status: DC | PRN
  Administered 2016-09-18: 1000 mL

## 2016-09-18 MED ORDER — ZOLPIDEM TARTRATE 5 MG PO TABS
5.0000 mg | ORAL_TABLET | Freq: Every evening | ORAL | Status: DC | PRN
Start: 2016-09-18 — End: 2016-09-21

## 2016-09-18 MED ORDER — SODIUM POLYSTYRENE SULFONATE 15 GM/60ML PO SUSP
30.0000 g | Freq: Once | ORAL | Status: DC
  Filled 2016-09-18: qty 120

## 2016-09-18 MED ORDER — ACETAMINOPHEN 650 MG RE SUPP: 650.0000 mg | Freq: Four times a day (QID) | RECTAL | Status: DC | PRN

## 2016-09-18 MED ORDER — ACETAMINOPHEN 325 MG PO TABS: 650.0000 mg | ORAL_TABLET | Freq: Four times a day (QID) | ORAL | Status: DC | PRN

## 2016-09-18 MED ORDER — SODIUM CHLORIDE 0.9 % IV SOLN
INTRAVENOUS | Status: DC
  Administered 2016-09-20: 16:00:00 via INTRAVENOUS

## 2016-09-18 MED ORDER — DEXTROSE 5 % IV SOLN
1.5000 g | INTRAVENOUS | Status: AC
  Administered 2016-09-18: 1.5 g via INTRAVENOUS

## 2016-09-18 MED ORDER — PROPOFOL 10 MG/ML IV BOLUS
INTRAVENOUS | Status: DC | PRN
  Administered 2016-09-18: 80 mg via INTRAVENOUS

## 2016-09-18 MED ORDER — LIDOCAINE HCL (CARDIAC) 20 MG/ML IV SOLN
INTRAVENOUS | Status: DC | PRN
  Administered 2016-09-18: 60 mg via INTRAVENOUS

## 2016-09-18 MED ORDER — FUROSEMIDE 10 MG/ML IJ SOLN
10.0000 mg | INTRAMUSCULAR | Status: AC
  Administered 2016-09-18: 10 mg via INTRAVENOUS
  Filled 2016-09-18: qty 1

## 2016-09-18 SURGICAL SUPPLY — 45 items
BANDAGE ACE 6X5 VEL STRL LF (GAUZE/BANDAGES/DRESSINGS) ×3 IMPLANT
BANDAGE ESMARK 6X9 LF (GAUZE/BANDAGES/DRESSINGS) IMPLANT
BLADE SAW RECIP 87.9 MT (BLADE) ×3 IMPLANT
BNDG COHESIVE 6X5 TAN STRL LF (GAUZE/BANDAGES/DRESSINGS) ×3 IMPLANT
BNDG ESMARK 6X9 LF (GAUZE/BANDAGES/DRESSINGS)
BNDG GAUZE ELAST 4 BULKY (GAUZE/BANDAGES/DRESSINGS) ×3 IMPLANT
CANISTER SUCT 3000ML PPV (MISCELLANEOUS) ×3 IMPLANT
CLIP TI MEDIUM 6 (CLIP) IMPLANT
COVER BACK TABLE 60X90IN (DRAPES) ×3 IMPLANT
COVER SURGICAL LIGHT HANDLE (MISCELLANEOUS) ×3 IMPLANT
CUFF TOURNIQUET SINGLE 18IN (TOURNIQUET CUFF) IMPLANT
CUFF TOURNIQUET SINGLE 24IN (TOURNIQUET CUFF) IMPLANT
CUFF TOURNIQUET SINGLE 34IN LL (TOURNIQUET CUFF) IMPLANT
CUFF TOURNIQUET SINGLE 44IN (TOURNIQUET CUFF) IMPLANT
DRAIN CHANNEL 19F RND (DRAIN) IMPLANT
DRAIN JACKSON PRATT 10MM FLAT (MISCELLANEOUS) ×3 IMPLANT
DRAPE HALF SHEET 40X57 (DRAPES) ×6 IMPLANT
DRAPE ORTHO SPLIT 77X108 STRL (DRAPES) ×2
DRAPE SURG ORHT 6 SPLT 77X108 (DRAPES) ×4 IMPLANT
DRSG ADAPTIC 3X8 NADH LF (GAUZE/BANDAGES/DRESSINGS) ×3 IMPLANT
ELECT REM PT RETURN 9FT ADLT (ELECTROSURGICAL) ×3
ELECTRODE REM PT RTRN 9FT ADLT (ELECTROSURGICAL) ×2 IMPLANT
EVACUATOR SILICONE 100CC (DRAIN) ×3 IMPLANT
GAUZE SPONGE 4X4 12PLY STRL (GAUZE/BANDAGES/DRESSINGS) ×6 IMPLANT
GAUZE SPONGE 4X4 12PLY STRL LF (GAUZE/BANDAGES/DRESSINGS) ×3 IMPLANT
GLOVE BIO SURGEON STRL SZ7.5 (GLOVE) ×3 IMPLANT
GOWN STRL REUS W/ TWL LRG LVL3 (GOWN DISPOSABLE) ×6 IMPLANT
GOWN STRL REUS W/TWL LRG LVL3 (GOWN DISPOSABLE) ×3
KIT BASIN OR (CUSTOM PROCEDURE TRAY) ×3 IMPLANT
KIT ROOM TURNOVER OR (KITS) ×3 IMPLANT
NS IRRIG 1000ML POUR BTL (IV SOLUTION) ×3 IMPLANT
PACK GENERAL/GYN (CUSTOM PROCEDURE TRAY) ×3 IMPLANT
PAD ARMBOARD 7.5X6 YLW CONV (MISCELLANEOUS) ×6 IMPLANT
STAPLER VISISTAT 35W (STAPLE) ×3 IMPLANT
STOCKINETTE IMPERVIOUS LG (DRAPES) ×3 IMPLANT
SUT ETHILON 3 0 PS 1 (SUTURE) ×3 IMPLANT
SUT SILK 2 0 (SUTURE) ×1
SUT SILK 2 0 SH CR/8 (SUTURE) ×3 IMPLANT
SUT SILK 2-0 18XBRD TIE 12 (SUTURE) ×2 IMPLANT
SUT VIC AB 2-0 SH 18 (SUTURE) ×6 IMPLANT
SUT VIC AB 3-0 SH 27 (SUTURE) ×2
SUT VIC AB 3-0 SH 27X BRD (SUTURE) ×4 IMPLANT
TOWEL GREEN STERILE (TOWEL DISPOSABLE) ×3 IMPLANT
UNDERPAD 30X30 (UNDERPADS AND DIAPERS) ×3 IMPLANT
WATER STERILE IRR 1000ML POUR (IV SOLUTION) ×3 IMPLANT

## 2016-09-18 NOTE — Anesthesia Procedure Notes (Signed)
Procedure Name: Intubation Date/Time: 09/18/2016 2:34 PM Performed by: Shirlyn Goltz Pre-anesthesia Checklist: Patient identified, Emergency Drugs available, Patient being monitored and Suction available Patient Re-evaluated:Patient Re-evaluated prior to inductionOxygen Delivery Method: Circle system utilized Preoxygenation: Pre-oxygenation with 100% oxygen Intubation Type: IV induction Ventilation: Mask ventilation without difficulty Laryngoscope Size: Mac and 3 Grade View: Grade III Tube type: Oral Tube size: 7.0 mm Number of attempts: 1 Airway Equipment and Method: Stylet Placement Confirmation: ETT inserted through vocal cords under direct vision,  positive ETCO2 and breath sounds checked- equal and bilateral Secured at: 21 cm Tube secured with: Tape Dental Injury: Teeth and Oropharynx as per pre-operative assessment  Difficulty Due To: Difficult Airway- due to reduced neck mobility

## 2016-09-18 NOTE — H&P (Signed)
History and Physical    Jodi Collins TKZ:[id number] DOB: 11/28/1950 DOA: 09/18/2016  PCP: Glendale Chard, MD Patient coming from: home  Chief Complaint: Status post above-the-knee amputation on left  HPI: Jodi Collins is a 66 y.o. female with medical history significant for DVT in left lower extremity and PE in May of this year. She was on Xarelto but this was discontinued due to GI bleed. She had an IVC filter placed June 12. She was diagnosed with gastric cancer after an EGD this month as well. She was scheduled to have a blood transfusion but this was deferred she was sent to the ER. She was found to be septic and was admitted presumably source being a UTI. She received Vanc and Zosyn. She also received 3 units of packed RBCs.   On June 26 did her toes were dark and swollen. Chart review indicates patient had been off anticoagulation due to GI bleed. Her toes became necrotic she developedepidermolysis of the forefoot and left foot. He was evaluated by vascular who opined she did not have adequate blood flow to the left foot and will need an amputation. Note indicates she was to be admitted to the hospitalist service postop.   ED Course: Examined in the postanesthesia care unit. She was lethargic but arousable with verbal stimuli. She states she did not start chemotherapy as initially planned. She is afebrile blood pressures in the low end of normal she's not hypoxic. She received 2 units of packed red blood cells.  Review of Systems: As per HPI otherwise all other systems reviewed and are negative.   Ambulatory Status: Wheelchair of late  Past Medical History:  Diagnosis Date  . Anemia   . DVT (deep venous thrombosis) ([place]) 08/17/2016  . 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 GI bleed   . Hyperkalemia   . Hyponatremia   . PE (pulmonary thromboembolism) ([place]) 08/28/2016  . Stomach cancer ([place])    stage IV, dx 08/30/16  . Thrombocytopenia ([place])       Past Surgical History:  Procedure Laterality Date  . CERVICAL CONIZATION W/BX  2012   high grade squamous intraepithelial dysplasia.  Dr Garwin Brothers.   . COLONOSCOPY W/ POLYPECTOMY  10/2006 and 10/2011   Dr Collene Mares.  had adenomatous polyps.    . ESOPHAGOGASTRODUODENOSCOPY N/A 08/30/2016   Procedure: ESOPHAGOGASTRODUODENOSCOPY (EGD);  Surgeon: Carol Ada, MD;  Location: [facility] [place];  Service: Gastroenterology;  Laterality: N/A;  . IR FLUORO GUIDE PORT INSERTION RIGHT  09/01/2016  . IR IVC FILTER PLMT / S&I /IMG GUID/MOD SED  08/29/2016  . IR US GUIDE VASC ACCESS RIGHT  09/01/2016    Social History   Social History  . Marital status: Legally Separated    Spouse name: N/A  . Number of children: N/A  . Years of education: N/A   Occupational History  . Not on file.   Social History Main Topics  . Smoking status: Never Smoker  . Smokeless tobacco: Never Used  . Alcohol use Yes     Comment: rarely  . Drug use: No  . Sexual activity: Not Currently   Other Topics Concern  . Not on file   Social History Narrative  . No narrative on file    Allergies  Allergen Reactions  . No Known Allergies     Family History  Problem Relation Age of Onset  . Hypertension Mother   . Diabetes Mother     Prior to Admission medications  Medication Sig Start Date End Date Taking? Authorizing Provider  oxyCODONE-acetaminophen (PERCOCET/ROXICET) 5-325 MG tablet Take 1-2 tablets by mouth every 4 (four) hours as needed for severe pain. 09/07/16  Yes Twana First, MD  polyethylene glycol powder [facility]) powder 1 capful daily as needed Patient taking differently: Take 17 g by mouth daily.  09/11/16  Yes Twana First, MD  dexamethasone (DECADRON) 4 MG tablet Take 2 tablets (8 mg total) by mouth daily. Start the day after chemotherapy for 2 days. Take with food. 09/11/16   Twana First, MD  fluorouracil CALGB 63845 in sodium chloride 0.9 % 150 mL Inject into the vein over 96 hr. Every 2 weeks,  wears pump for 46 hours    [provider]  leucovorin in dextrose 5 % 250 mL Inject into the vein once. Every 2 weeks    [provider]  lidocaine-prilocaine (EMLA) cream Apply to affected area once 09/07/16   Twana First, MD  Misc. Devices [place] Wheelchair 09/12/16   Twana First, MD  ondansetron (ZOFRAN) 8 MG tablet Take 1 tablet (8 mg total) by mouth 2 (two) times daily as needed for refractory nausea / vomiting. Start on day 3 after chemotherapy. 09/07/16   Twana First, MD  OXALIPLATIN IV Inject into the vein. Every 2 weeks    [provider]  prochlorperazine (COMPAZINE) 10 MG tablet Take 1 tablet (10 mg total) by mouth every 6 (six) hours as needed (Nausea or vomiting). 09/07/16   Twana First, MD    Physical Exam: Vitals:   09/18/16 1648 09/18/16 1715 09/18/16 1724 09/18/16 1729  BP:  96/68  98/70  Pulse: 91 85 90   Resp: 19 14 19 15   Temp:  97.3 F (36.3 C)    TempSrc:      SpO2: 100%  97%   Weight:      Height:         General:  Appears Somewhat lethargic but not uncomfortable Eyes:  PERRL, EOMI, normal lids, iris ENT:  grossly normal hearing, lips & tongue, because membranes of her mouth are dry but pink Neck:  no LAD, masses or thyromegaly Cardiovascular:  RRR, no m/r/g. Left above-the-knee amputation. Right with 1+ pitting edema Respiratory:  Respirations somewhat shallow breath sounds distant no wheezes no crackles Abdomen:  soft, ntnd, is a bowel sounds but somewhat sluggish no guarding or rebounding Skin:  no rash or induration seen on limited exam Musculoskeletal:  grossly normal tone BUE/BLE, good ROM, no bony abnormality Psychiatric:  grossly normal mood and affect, speech fluent and appropriate, AOx3 Neurologic:  CN 2-12 grossly intact, moves all extremities in coordinated fashion, sensation intact is lethargic but arouses to verbal stimuli she is oriented to self and place. Speech is slow but clear  Labs on Admission: I have personally  reviewed following labs and imaging studies  CBC:  Recent Labs Lab 09/11/16 2017 09/18/16 1035  WBC 19.2* 23.1*  NEUTROABS 16.9*  --   HGB 8.0* 6.7*  HCT 24.0* 21.9*  MCV 83.3 84.2  PLT 109* 96*   Basic Metabolic Panel:  Recent Labs Lab 09/11/16 2017 09/18/16 1035  NA 132* 131*  K 4.1 6.0*  CL 107 104  CO2 16* 16*  GLUCOSE 121* 117*  BUN 50* 41*  CREATININE 1.24* 1.35*  CALCIUM 8.0* 8.6*   GFR: Estimated Creatinine Clearance: 36.9 mL/min (A) (by C-G formula based on SCr of 1.35 mg/dL (H)). Liver Function Tests:  Recent Labs Lab 09/11/16 2017 09/18/16 1035  AST  57* 36  ALT 44 30  ALKPHOS 85 111  BILITOT 1.0 0.5  PROT 6.1* 6.4*  ALBUMIN 1.8* 2.0*   No results for input(s): LIPASE, AMYLASE in the last 168 hours. No results for input(s): AMMONIA in the last 168 hours. Coagulation Profile:  Recent Labs Lab 09/11/16 2017 09/18/16 1035  INR 1.47 1.54   Cardiac Enzymes: No results for input(s): CKTOTAL, CKMB, CKMBINDEX, TROPONINI in the last 168 hours. BNP (last 3 results) No results for input(s): PROBNP in the last 8760 hours. HbA1C: No results for input(s): HGBA1C in the last 72 hours. CBG: No results for input(s): GLUCAP in the last 168 hours. Lipid Profile: No results for input(s): CHOL, HDL, LDLCALC, TRIG, CHOLHDL, LDLDIRECT in the last 72 hours. Thyroid Function Tests: No results for input(s): TSH, T4TOTAL, FREET4, T3FREE, THYROIDAB in the last 72 hours. Anemia Panel: No results for input(s): VITAMINB12, FOLATE, FERRITIN, TIBC, IRON, RETICCTPCT in the last 72 hours. Urine analysis:    Component Value Date/Time   COLORURINE AMBER (A) 09/08/2016 1340   APPEARANCEUR CLOUDY (A) 09/08/2016 1340   LABSPEC 1.023 09/08/2016 1340   PHURINE 5.0 09/08/2016 1340   GLUCOSEU 50 (A) 09/08/2016 1340   HGBUR MODERATE (A) 09/08/2016 1340   BILIRUBINUR SMALL (A) 09/08/2016 1340   KETONESUR NEGATIVE 09/08/2016 1340   PROTEINUR 30 (A) 09/08/2016 1340    NITRITE NEGATIVE 09/08/2016 1340   LEUKOCYTESUR NEGATIVE 09/08/2016 1340    Creatinine Clearance: Estimated Creatinine Clearance: 36.9 mL/min (A) (by C-G formula based on SCr of 1.35 mg/dL (H)).  Sepsis Labs: @LABRCNTIP (procalcitonin:4,lacticidven:4) )No results found for this or any previous visit (from the past 240 hour(s)).   Radiological Exams on Admission: No results found.  EKG: pending  Assessment/Plan Principal Problem:   S/P AKA (above knee amputation) unilateral, left (HCC) Active Problems:   Anemia   Gastric cancer (HCC)   Left leg DVT (HCC)   Hyperkalemia   Hyponatremia   Acute kidney injury ([place])   GI bleed   Leukocytosis   #1. Status post above-the-knee amputation on left secondary to profound foot ischemia of unclear etiology. See vascular note dated 09/12/2016.  See history of present illness. Vital signs are stable but blood pressures on the low end of normal. She is lethargic but arousable. -Postop orders per vascular  #2. Anemia. Likely acute blood loss in the setting of history of GI bleed.  Hemoglobin 6.7 today. She received 2 units of packed red blood cells. It appears her baseline is closer to 10. -Serial CBCs -Anemia panel -Monitor  #3. Acute kidney injury. Likely related to above setting of soft blood pressure. Creatinine 1.35 which is better than it was her last hospitalization at the end of June. -Hold nephrotoxins -Monitor urine output -Recheck this evening  #4. Hyperkalemia. Likely related #3.  Potassium 6.0. -Kayexalate -bmet at 10pm -telemetry  #5. Gastric cancer.2 weeks ago. Upper endoscopy June 13 reveals A 4 cm hiatal hernia was present.A large, fungating mass with no bleeding and no stigmata of recent bleeding was found at the gastroesophageal junction. The mass was non-obstructing and not circumferential.A large, fungating, non-circumferential mass with no bleeding and no stigmata of recent bleeding was found at the gastroesophageal  junction, in the cardia, in the gastric fundus and in the gastric body. Biopsies were taken with a cold forceps for histology. He has seen oncology and chemotherapy was scheduled for tomorrow. Chart review indicates oncology opted to defer initiation of chemotherapy until after surgery -Outpatient follow-up  #6. History of  GI bleed. Recent diagnosis of DVT placed on Xarelto which was stopped after several days due to melena. -See #5. -Serial CBCs  #7. Leukocytosis. Likely related to gangrenous foot on the left. Chart review indicates WBCs have been trending upward for 2 weeks. She is afebrile. Her blood pressure low end of normal but otherwise she's hemodynamically stable. She received 2 units of packed red blood cells. Urinalysis pending -Serial CBCs -follow chest x-ray    DVT prophylaxis: scd Code Status: full  Family Communication: none present  Disposition Plan: home  Consults called: none  Admission status: inpatient    Radene Gunning MD [facility]  If 7PM-7AM, please contact night-coverage [email or web address] Password [facility]  09/18/2016, 5:45 PM

## 2016-09-18 NOTE — Anesthesia Procedure Notes (Signed)
Performed by: Shirlyn Goltz

## 2016-09-18 NOTE — Progress Notes (Signed)
In discussion with pt and her sister I have informed them that BKA would be very high risk for wound complications in light of significant swelling protein calorie malnutrition cancer and chemotherapy.  In light of this we will proceed with AKA with better chance of wound healing.  Ruta Hinds, MD [facility] [place] Office: [phone] Pager: [phone]

## 2016-09-18 NOTE — Op Note (Signed)
[facility] OPERATIVE NOTE Procedure: Left above knee amputation  Surgeon(s): Elam Dutch, MD  ASSISTANT: Leontine Locket, PA-C  Anesthesia: General  Specimens: Left leg  PROCEDURE DETAIL: After obtaining informed consent, the patient was taken to the operating room. The patient was placed in supine position the operating room table. After induction of general anesthesia and endotracheal intubation the patient's Foley catheter was placed. Next patient's entire left lower extremity was prepped and draped in usual sterile fashion. A circumferential incision was made on the left leg just above the knee. The incision was carried down into the sucutaneous tissues down to level the saphenous vein. This was ligated and divided between silk ties. Soft tissues were taken down as well as the muscle and fascia with cautery. The superficial femoral artery and vein were dissected free circumferentially clamped and divided. These were suture ligated proximally. The artery was patent.  The vein was filled with fresh thrombus.  Remainder of the soft tissues were taken down with cautery. The periosteum was raised on the femur approximately 5 cm above the skin edge. The femur was divided at this level. The leg was passed off the table as a specimen. Hemostasis was obtained. The wound was thoroughly irrigated with normal saline solution. The fascial edges were reapproximated using interrupted 2 0 Vicryl sutures. There was a copious amount of edema fluid continuously flowing out of the tissues so a 10 flat JP was placed below the fascial level and brought out through a separate stab on the lateral thigh and secured with a 2 0 nylon stitch.  The subcutaneous tissues reapproximated using a running 3-0 Vicryl suture. The skin was closed staples. Patient tolerated procedure well and there were no complications. Instrument sponge and needle counts correct in the case. Patient was taken to recovery in  stable condition.  Ruta Hinds, MD [facility] [place] Office: [phone] Pager: [phone]

## 2016-09-18 NOTE — Transfer of Care (Signed)
Immediate Anesthesia Transfer of Care Note  Patient: [name]  Procedure(s) Performed: Procedure(s): AMPUTATION ABOVE KNEE-LEFT (Left)  Patient Location: PACU  Anesthesia Type:General  Level of Consciousness: awake, alert , oriented and patient cooperative  Airway & Oxygen Therapy: Patient Spontanous Breathing and Patient connected to nasal cannula oxygen  Post-op Assessment: Report given to RN and Post -op Vital signs reviewed and stable  Post vital signs: Reviewed and stable  Last Vitals:  Vitals:   09/18/16 1340 09/18/16 1355  BP: (!) 90/54 (!) 100/52  Pulse: (!) 102 99  Resp:  16  Temp:  36.1 C    Last Pain:  Vitals:   09/18/16 1355  TempSrc: Oral      Patients Stated Pain Goal: 0 (67/67/20 9470)  Complications: No apparent anesthesia complications

## 2016-09-18 NOTE — H&P (View-Only) (Signed)
HISTORY AND PHYSICAL     CC:  Leg swelling and skin cyanosis Requesting Provider:  Glendale Chard, MD  HPI: This is a 66 y.o. female who was diagnosed with a DVT in the LLE in May 2018 and PE on recent admission this month.  She was on Xarelto, but this was discontinued due to GIB.  She had an IVC filter placed on 08/29/16.  She was diagnosed with gastric cancer this month after an EGD.  She was scheduled to have a blood transfusion, but this was deferred and she was sent to the ER.  She was septic upon admission with the source thought to be UTI.  She was on Vanc and Zosyn.  She subsequently received 3 units PRBC's.   Her creatinine on 6/11 was 1.14 and on 6/22 it was as high as 6.76. Yesterday on her labs, her creatinine had improved to 1.24.  She still have a persistent leukocytosis of 19k and thrombocytopenia with platelets at 109k (improved).   She is to start chemo tomorrow and will get chemo every couple of weeks.  This will take place in [place].   Pt was discharged from the hospital yesterday.  Her daughters flew in from separate states and the pt was complaining of pain in her left leg/foot.  She had her TED hose on, which had not been removed.  The daughter spoke to the nurse and was told to bring her back to the ER.  At that time, it was noted that her toes were dark in color and foot swollen. Daughter states that some of the fluid was drained and relieved some of the swelling.    Pt states that she is not having a great deal of pain and that she has only taken 3 or 4 pain pills in anticipation of when the pain will start.  Her daughter states that she has been having a hard time standing.   She is referred urgently to [facility] for evaluation after discussion with Dr. Bridgett Larsson.    She is no longer on anticoagulation.    Past Medical History:  Diagnosis Date  . Anemia   . DVT (deep venous thrombosis) ([place])   . Stomach cancer [facility])     Past Surgical History:  Procedure Laterality Date  .  CERVICAL CONIZATION W/BX  2012   high grade squamous intraepithelial dysplasia.  Dr Garwin Brothers.   . COLONOSCOPY W/ POLYPECTOMY  10/2006 and 10/2011   Dr Collene Mares.  had adenomatous polyps.    . ESOPHAGOGASTRODUODENOSCOPY N/A 08/30/2016   Procedure: ESOPHAGOGASTRODUODENOSCOPY (EGD);  Surgeon: Carol Ada, MD;  Location: [place];  Service: Gastroenterology;  Laterality: N/A;  . IR FLUORO GUIDE PORT INSERTION RIGHT  09/01/2016  . IR IVC FILTER PLMT / S&I /IMG GUID/MOD SED  08/29/2016  . IR US GUIDE VASC ACCESS RIGHT  09/01/2016    No Known Allergies  Current Outpatient Prescriptions  Medication Sig Dispense Refill  . ciprofloxacin (CIPRO) 500 MG tablet Take 1 tablet (500 mg total) by mouth 2 (two) times daily. 10 tablet 0  . dexamethasone (DECADRON) 4 MG tablet Take 2 tablets (8 mg total) by mouth daily. Start the day after chemotherapy for 2 days. Take with food. 30 tablet 1  . fluorouracil CALGB 54627 in sodium chloride 0.9 % 150 mL Inject into the vein over 96 hr. Every 2 weeks, wears pump for 46 hours    . leucovorin in dextrose 5 % 250 mL Inject into the vein once. Every 2 weeks    .  lidocaine-prilocaine (EMLA) cream Apply to affected area once 30 g 3  . Misc. Devices MISC Wheelchair 1 each 0  . ondansetron (ZOFRAN) 8 MG tablet Take 1 tablet (8 mg total) by mouth 2 (two) times daily as needed for refractory nausea / vomiting. Start on day 3 after chemotherapy. 30 tablet 1  . OXALIPLATIN IV Inject into the vein. Every 2 weeks    . oxyCODONE-acetaminophen (PERCOCET/ROXICET) 5-325 MG tablet Take 1-2 tablets by mouth every 4 (four) hours as needed for severe pain. 90 tablet 0  . polyethylene glycol powder (GLYCOLAX/MIRALAX) powder 1 capful daily as needed 255 g 2  . prochlorperazine (COMPAZINE) 10 MG tablet Take 1 tablet (10 mg total) by mouth every 6 (six) hours as needed (Nausea or vomiting). 30 tablet 1   No current facility-administered medications for this visit.     Family History    Problem Relation Age of Onset  . Hypertension Mother   . Diabetes Mother     Social History   Social History  . Marital status: Legally Separated    Spouse name: N/A  . Number of children: N/A  . Years of education: N/A   Occupational History  . Not on file.   Social History Main Topics  . Smoking status: Never Smoker  . Smokeless tobacco: Never Used  . Alcohol use Yes     Comment: rarely  . Drug use: No  . Sexual activity: Not Currently   Other Topics Concern  . Not on file   Social History Narrative  . No narrative on file     REVIEW OF SYSTEMS:   [X]  denotes positive finding, [ ]  denotes negative finding Cardiac  Comments:  Chest pain or chest pressure: x   Shortness of breath upon exertion: x   Short of breath when lying flat:    Irregular heart rhythm: x With activity      Vascular    Pain in calf, thigh, or hip brought on by ambulation: x   Pain in feet at night that wakes you up from your sleep:  x   Blood clot in your veins: x   Leg swelling:  x       Pulmonary    Oxygen at home:    Productive cough:     Wheezing:         Neurologic    Sudden weakness in legs:  x Left foot  Sudden numbness in  legs:  x Left foot  Sudden onset of difficulty speaking or slurred speech:    Temporary loss of vision in one eye:     Problems with dizziness:         Gastrointestinal    Blood in stool:  x   Vomited blood:         Genitourinary    Burning when urinating:     Blood in urine:        Psychiatric    Major depression:         Hematologic    Bleeding problems:    Problems with blood clotting too easily:        Skin    Rashes or ulcers:        Constitutional    Fever or chills:      PHYSICAL EXAMINATION:  Vitals:   09/12/16 1406  BP: 105/66  Pulse: (!) 106  Resp: 20  Temp: (!) 96.5 F (35.8 C)   Body mass index is 25.61 kg/m.  General:  WDWN in NAD; vital signs documented above Gait: Not observed HENT: WNL,  normocephalic Pulmonary: normal non-labored breathing , without Rales, rhonchi,  wheezing Cardiac: regular HR, without  Murmurs, rubs or gallops; without carotid bruits Abdomen: soft, NT, no masses Vascular Exam/Pulses:  Right Left  Radial 2+ (normal) 2+ (normal)  DP 1+ (weak) absent  PT + doppler signal +doppler signal  Peroneal + doppler signal +doppler signal   Extremities: left foot with darkened toes and epidermolysis on the forefoot.  Left foot is cold to touch.  Right foot is warm.  BLE with left > right. Musculoskeletal: no muscle wasting or atrophy  Neurologic: A&O X 3;  No focal weakness or paresthesias are detected Psychiatric:  The pt has Normal affect.   Non-Invasive Vascular Imaging:   2D echo 08/29/16: Study Conclusions - Left ventricle: The cavity size was normal. Systolic function was   normal. The estimated ejection fraction was in the range of 60%   to 65%. Wall motion was normal; there were no regional wall   motion abnormalities. Left ventricular diastolic function   parameters were normal. - Atrial septum: No defect or patent foramen ovale was identified.  CT abdomen/pelvis 08/28/16: IMPRESSION: The study is positive for right-sided pulmonary emboli involving segmental and subsegmental branches. Assessing for heart strain is limited given the distortion secondary to left-sided diaphragmatic hernia. If there is concern for right-sided heart strain, correlation with cardiac ECHO may be useful.  Left-sided diaphragmatic hernia, involving majority of the stomach, splenic flexure, and large portion of the associated mesentery.  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 Alternative site of a presumed malignancy of the left upper quadrant could be the splenic flexure. Correlation with endoscopy and possibly  colonoscopy recommended.  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  Enhancing lesion within the umbilical region, likely metastatic involvement of lymph node.  Small hypodense lesions of the right liver, too small to characterize and possibly benign cysts. Correlation with any future PET-CT may be useful.  Low-density lesion of the lateral cortex of the right kidney. Differential diagnosis includes kidney infarction, infection, or tumor implant.  Small nodule of the right upper lobe measuring 4 mm. Correlation with future imaging recommended.  Nonocclusive left iliac DVT. Pt meds includes: Statin:  No. Beta Blocker:  No. Aspirin:  No. ACEI:  No. ARB:  No. CCB use:  No Other Antiplatelet/Anticoagulant:  No   ASSESSMENT/PLAN:: 66 y.o. female with recently diagnosed gastric cancer and LLE DVT with PE    -pt has been off anticoagulation due to GIB.  Her toes are necrotic and she has epidermolysis of the forefoot of the left foot.  She does not have adequate blood flow to the left foot as she does not have doppler signals below the level of the ankle and will need a left below knee amputation.  Dr. Donnetta Hutching had lengthy discussion with pt and her daughters.  She is supposed to start chemo tomorrow, which should not be affected.  She and her daughters are going to go home and discuss with family and the oncology navigator and decide a plan on when to proceed with surgery, most likely later this week.   -She will be admitted to the hospitalist service and  oncology will be consulted at that time.  She may need CIR post operatively or a SNF depending on post operative evaluation.     Leontine Locket, PA-C [facility] [phone]  Clinic MD:  Pt seen and examined with Dr.  Donnetta Hutching  Very complicated problem as outlined above. Unclear as to the cause of her profound foot ischemia. Her toes are progressing to dry gangrenous changes with black color and beginning to demarcate. Epidermal lysis of the skin over the entire dorsum of her foot. Explain only option would be below-knee amputation. She has loud signal in her posterior tibial at the ankle but does sound to be consistent with distal occlusion. Clearly hypercoagulable related to her gastric cancer with multiple DVT and pulmonary embolus. Unclear as to why she had thrombosis of her foot. Explain her only option is below-knee amputation. She does have marked swelling in her calf which may make healing somewhat difficult. She is not willing to be admitted for surgery at this time. Will speak with the oncology team and initiate chemotherapy tomorrow which should have no bearing regarding her foot. Will notify when she wishes to be admitted to [facility] hospital for amputation

## 2016-09-18 NOTE — Anesthesia Preprocedure Evaluation (Signed)
Anesthesia Evaluation  Patient identified by MRN, date of birth, ID band Patient awake    Reviewed: Allergy & Precautions, NPO status , Patient's Chart, lab work & pertinent test results  Airway Mallampati: II  TM Distance: >3 FB Neck ROM: Full    Dental no notable dental hx.    Pulmonary PE   Pulmonary exam normal breath sounds clear to auscultation       Cardiovascular + Peripheral Vascular Disease  Normal cardiovascular exam Rhythm:Regular Rate:Normal  Left ventricle: The cavity size was normal. Systolic function was   normal. The estimated ejection fraction was in the range of 60%   to 65%. Wall motion was normal; there were no regional wall   motion abnormalities. Left ventricular diastolic function   parameters were normal. - Atrial septum: No defect or patent foramen ovale was identified.    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    Endo/Other  negative endocrine ROS  Renal/GU Renal InsufficiencyRenal diseasenegative Renal ROS  negative genitourinary   Musculoskeletal negative musculoskeletal ROS (+)   Abdominal   Peds negative pediatric ROS (+)  Hematology  (+) anemia ,   Anesthesia Other Findings   Reproductive/Obstetrics negative OB ROS                             Anesthesia Physical Anesthesia Plan  ASA: IV  Anesthesia Plan: General   Post-op Pain Management:    Induction: Intravenous  PONV Risk Score and Plan: 1 and Ondansetron, Dexamethasone and Treatment may vary due to age or medical condition  Airway Management Planned: Oral ETT  Additional Equipment:   Intra-op Plan:   Post-operative Plan: Extubation in OR  Informed Consent: I have reviewed the patients History and Physical, chart, labs and discussed the procedure including the risks, benefits and alternatives  for the proposed anesthesia with the patient or authorized representative who has indicated his/her understanding and acceptance.   Dental advisory given  Plan Discussed with: CRNA and Surgeon  Anesthesia Plan Comments: (Severe anemia, will transfuse prior to induction)        Anesthesia Quick Evaluation

## 2016-09-18 NOTE — Anesthesia Postprocedure Evaluation (Signed)
Anesthesia Post Note  Patient: Jodi Collins  Procedure(s) Performed: Procedure(s) (LRB): AMPUTATION ABOVE KNEE-LEFT (Left)     Patient location during evaluation: PACU Anesthesia Type: General Level of consciousness: awake and alert Pain management: pain level controlled Vital Signs Assessment: post-procedure vital signs reviewed and stable Respiratory status: spontaneous breathing, nonlabored ventilation, respiratory function stable and patient connected to nasal cannula oxygen Cardiovascular status: blood pressure returned to baseline and stable Postop Assessment: no signs of nausea or vomiting Anesthetic complications: no    Last Vitals:  Vitals:   09/18/16 1724 09/18/16 1729  BP:  98/70  Pulse: 90   Resp: 19 15  Temp:      Last Pain:  Vitals:   09/18/16 1715  TempSrc:   PainSc: 2                  Dayne Chait S

## 2016-09-18 NOTE — Interval H&P Note (Signed)
History and Physical Interval Note:  09/18/2016 1:29 PM  Jodi Collins  has presented today for surgery, with the diagnosis of Ischemia Left Foot  M62.89 Dry Gangrene of toes left foot  I96  The various methods of treatment have been discussed with the patient and family. After consideration of risks, benefits and other options for treatment, the patient has consented to  Procedure(s): AMPUTATION BELOW KNEE-LEFT (Left) as a surgical intervention .  The patient's history has been reviewed, patient examined, no change in status, stable for surgery.  I have reviewed the patient's chart and labs.  Questions were answered to the patient's satisfaction.     Ruta Hinds

## 2016-09-19 ENCOUNTER — Encounter (HOSPITAL_COMMUNITY): Payer: Self-pay | Admitting: Vascular Surgery

## 2016-09-19 ENCOUNTER — Encounter (HOSPITAL_COMMUNITY): Payer: Self-pay | Admitting: Emergency Medicine

## 2016-09-19 DIAGNOSIS — N183 Chronic kidney disease, stage 3 unspecified: Secondary | ICD-10-CM

## 2016-09-19 DIAGNOSIS — D62 Acute posthemorrhagic anemia: Secondary | ICD-10-CM

## 2016-09-19 DIAGNOSIS — D696 Thrombocytopenia, unspecified: Secondary | ICD-10-CM

## 2016-09-19 LAB — URINALYSIS, ROUTINE W REFLEX MICROSCOPIC
BILIRUBIN URINE: NEGATIVE
Glucose, UA: NEGATIVE mg/dL
Hgb urine dipstick: NEGATIVE
Ketones, ur: NEGATIVE mg/dL
Leukocytes, UA: NEGATIVE
NITRITE: NEGATIVE
PH: 5 (ref 5.0–8.0)
Protein, ur: NEGATIVE mg/dL
SPECIFIC GRAVITY, URINE: 1.025 (ref 1.005–1.030)

## 2016-09-19 LAB — COMPREHENSIVE METABOLIC PANEL
ALBUMIN: 1.9 g/dL — AB (ref 3.5–5.0)
ALK PHOS: 82 U/L (ref 38–126)
ALT: 20 U/L (ref 14–54)
AST: 40 U/L (ref 15–41)
Anion gap: 7 (ref 5–15)
BILIRUBIN TOTAL: 1.3 mg/dL — AB (ref 0.3–1.2)
BUN: 43 mg/dL — AB (ref 6–20)
CO2: 19 mmol/L — ABNORMAL LOW (ref 22–32)
CREATININE: 1.29 mg/dL — AB (ref 0.44–1.00)
Calcium: 8 mg/dL — ABNORMAL LOW (ref 8.9–10.3)
Chloride: 107 mmol/L (ref 101–111)
GFR calc Af Amer: 50 mL/min — ABNORMAL LOW (ref >60)
GFR, EST NON AFRICAN AMERICAN: 43 mL/min — AB (ref >60)
GLUCOSE: 105 mg/dL — AB (ref 65–99)
Potassium: 5.4 mmol/L — ABNORMAL HIGH (ref 3.5–5.1)
Sodium: 133 mmol/L — ABNORMAL LOW (ref 135–145)
TOTAL PROTEIN: 5.2 g/dL — AB (ref 6.5–8.1)

## 2016-09-19 LAB — GLUCOSE, CAPILLARY
GLUCOSE-CAPILLARY: 125 mg/dL — AB (ref 65–99)
GLUCOSE-CAPILLARY: 109 mg/dL — AB (ref 65–99)
GLUCOSE-CAPILLARY: 126 mg/dL — AB (ref 65–99)
Glucose-Capillary: 111 mg/dL — ABNORMAL HIGH (ref 65–99)

## 2016-09-19 LAB — POCT I-STAT 4, (NA,K, GLUC, HGB,HCT)
Glucose, Bld: 113 mg/dL — ABNORMAL HIGH (ref 65–99)
HCT: 20 % — ABNORMAL LOW (ref 36.0–46.0)
HEMOGLOBIN: 6.8 g/dL — AB (ref 12.0–15.0)
POTASSIUM: 5.7 mmol/L — AB (ref 3.5–5.1)
Sodium: 133 mmol/L — ABNORMAL LOW (ref 135–145)

## 2016-09-19 LAB — CBC
HCT: 23.6 % — ABNORMAL LOW (ref 36.0–46.0)
Hemoglobin: 7.8 g/dL — ABNORMAL LOW (ref 12.0–15.0)
MCH: 28.3 pg (ref 26.0–34.0)
MCHC: 33.1 g/dL (ref 30.0–36.0)
MCV: 85.5 fL (ref 78.0–100.0)
Platelets: 64 10*3/uL — ABNORMAL LOW (ref 150–400)
RBC: 2.76 MIL/uL — ABNORMAL LOW (ref 3.87–5.11)
RDW: 16.4 % — AB (ref 11.5–15.5)
WBC: 20.8 10*3/uL — AB (ref 4.0–10.5)

## 2016-09-19 LAB — VITAMIN B12: Vitamin B-12: 3160 pg/mL — ABNORMAL HIGH (ref 180–914)

## 2016-09-19 LAB — FOLATE: FOLATE: 8.1 ng/mL (ref >5.9)

## 2016-09-19 LAB — IRON AND TIBC
Iron: 18 ug/dL — ABNORMAL LOW (ref 28–170)
SATURATION RATIOS: 9 % — AB (ref 10.4–31.8)
TIBC: 193 ug/dL — AB (ref 250–450)
UIBC: 175 ug/dL

## 2016-09-19 LAB — HIV ANTIBODY (ROUTINE TESTING W REFLEX): HIV Screen 4th Generation wRfx: NONREACTIVE

## 2016-09-19 LAB — POTASSIUM: Potassium: 5.4 mmol/L — ABNORMAL HIGH (ref 3.5–5.1)

## 2016-09-19 LAB — FERRITIN: Ferritin: 261 ng/mL (ref 11–307)

## 2016-09-19 MED ORDER — ENSURE ENLIVE PO LIQD
237.0000 mL | Freq: Two times a day (BID) | ORAL | Status: DC
  Administered 2016-09-20 (×2): 237 mL via ORAL

## 2016-09-19 MED ORDER — PNEUMOCOCCAL VAC POLYVALENT 25 MCG/0.5ML IJ INJ: 0.5000 mL | INJECTION | INTRAMUSCULAR | Status: DC | PRN

## 2016-09-19 MED ORDER — WHITE PETROLATUM GEL
Status: AC
  Administered 2016-09-19: 10:00:00
  Filled 2016-09-19: qty 1

## 2016-09-19 MED ORDER — SODIUM POLYSTYRENE SULFONATE 15 GM/60ML PO SUSP
45.0000 g | Freq: Once | ORAL | Status: AC
  Administered 2016-09-19: 45 g via ORAL
  Filled 2016-09-19: qty 180

## 2016-09-19 MED ORDER — POLYETHYLENE GLYCOL 3350 17 G PO PACK: 17.0000 g | PACK | Freq: Every day | ORAL | Status: DC

## 2016-09-19 NOTE — Progress Notes (Signed)
PROGRESS NOTE    Jodi Collins  NFA:[id number] DOB: 1951/03/13 DOA: 09/18/2016 PCP: Glendale Chard, MD    Brief Narrative:  Patient is 66 year old female history of left lower extremity DVT/PE was on Xarelto which was subsequently discontinued secondary to a GI bleed from gastric cancer. Patient underwent left AKA per Dr.Fields after patient developed necrotic toes and IP dermal lysis of the forefoot and foot. Patient also noted to be anemic with a hemoglobin of 6.7. Patient also noted to be hyperkalemic. [facility] admitted patient due to her multiple comorbidities.   Assessment & Plan:   Principal Problem:   S/P AKA (above knee amputation) unilateral, left (HCC) Active Problems:   Anemia   Gastric cancer (HCC)   Left leg DVT (HCC)   Hyperkalemia   Hyponatremia   Acute kidney injury ([place])   GI bleed   Leukocytosis   Post-op pain   Acute blood loss anemia   Thrombocytopenia (HCC)   Stage 3 chronic kidney disease  #1 status post left AKA secondary to left foot ischemia Patient underwent left AKA 09/18/2016. Per vascular surgery.  #2 acute blood loss anemia/iron deficiency anemia Patient noted to have a hemoglobin of 6.7 on the day of admission. Patient status post 2 units packed red blood cells with hemoglobin currently at 7.8. Anemia panel consistent with iron deficiency anemia. Will give a dose of IV Feraheme. Will likely need oral iron supplementation on discharge. Follow H&H. Chest vision threshold hemoglobin less than 7. I F   #3 hyperkalemia Questionable etiology. Patient status post Kayexalate have a potassium level still elevated. Repeat potassium. If still elevated will give a dose of Kayexalate.  #4 acute kidney injury Likely secondary to prerenal azotemia. Improving on hydration. Continue to hold nephrotoxins. Follow.  #5 leukocytosis Likely secondary to left foot ischemia. Will check blood cultures 2. Check a UA with cultures and sensitivities.  Chest x-ray negative for any infiltrates. Follow.  #6 history of left lower extremity DVT Status post IVC filter placement. Patient was on anticoagulation which was discontinued secondary to GI bleed secondary to gastric cancer.  #7 hyponatremia Improving.  #8 history of gastric cancer Diagnosed 2 weeks ago. Upper endoscopy 08/30/2016 revealed a 4 cm facial hernia as well as a large fungating mass with no bleeding, no stigmata of recent bleeding noted at the GE junction. Biopsies taken. Outpatient follow-up with oncology.  #9 borderline blood pressure Patient with borderline blood pressure with systolic blood pressures in the 90s. Check blood cultures 2. Check a UA with cultures and sensitivities. Patient noted to be anemic. Patient afebrile. Place on IV fluids. Follow.   DVT prophylaxis: SCDs Code Status: Full Family Communication: Updated patient. No family at bedside. Disposition Plan: Skilled nursing facility versus CIR when stable and per vascular surgery.   Consultants:   Vascular surgery  Procedures:   Chest x-ray 09/18/2016  Left AKA per Dr. Oneida Alar 09/18/2016  2 units packed red blood cells 09/18/2016  Antimicrobials:  IV cefuroxime 09/18/2016 perioperative   Subjective: Patient sitting up in chair eating lunch. No chest pain. No shortness of breath.  Objective: Vitals:   09/18/16 1729 09/18/16 1805 09/18/16 2032 09/19/16 0551  BP: 98/70 96/63 (!) 91/56 95/60  Pulse:  90 95 91  Resp: 15 16 18 18   Temp:  98.4 F (36.9 C) 97.5 F (36.4 C) 97.7 F (36.5 C)  TempSrc:  Axillary Oral Oral  SpO2:  96% 95% 100%  Weight:      Height:  Intake/Output Summary (Last 24 hours) at 09/19/16 1247 Last data filed at 09/19/16 1015  Gross per 24 hour  Intake             3110 ml  Output              640 ml  Net             2470 ml   Filed Weights   09/18/16 1025  Weight: 63.5 kg (140 lb)    Examination:  General exam: Appears calm and comfortable    Respiratory system: Clear to auscultation. Respiratory effort normal. Cardiovascular system: S1 & S2 heard, RRR. No JVD, murmurs, rubs, gallops or clicks. No pedal edema. Gastrointestinal system: Abdomen is nondistended, soft and nontender. No organomegaly or masses felt. Normal bowel sounds heard. Central nervous system: Alert and oriented. No focal neurological deficits. Extremities: s/p left AKA.  RLE with 3 + edema.  Skin: No rashes, lesions or ulcers Psychiatry: Judgement and insight appear normal. Mood & affect appropriate.     Data Reviewed: I have personally reviewed following labs and imaging studies  CBC:  Recent Labs Lab 09/18/16 1035 09/18/16 1509 09/18/16 2149 09/19/16 0252  WBC 23.1*  --  23.6* 20.8*  HGB 6.7* 6.8* 8.5* 7.8*  HCT 21.9* 20.0* 25.9* 23.6*  MCV 84.2  --  86.3 85.5  PLT 96*  --  64* 64*   Basic Metabolic Panel:  Recent Labs Lab 09/18/16 1035 09/18/16 1509 09/18/16 2149 09/19/16 0252 09/19/16 0937  NA 131* 133* 132* 133*  --   K 6.0* 5.7* 5.4* 5.4* 5.4*  CL 104  --  105 107  --   CO2 16*  --  19* 19*  --   GLUCOSE 117* 113* 127* 105*  --   BUN 41*  --  41* 43*  --   CREATININE 1.35*  --  1.35* 1.29*  --   CALCIUM 8.6*  --  8.1* 8.0*  --    GFR: Estimated Creatinine Clearance: 38.6 mL/min (A) (by C-G formula based on SCr of 1.29 mg/dL (H)). Liver Function Tests:  Recent Labs Lab 09/18/16 1035 09/19/16 0252  AST 36 40  ALT 30 20  ALKPHOS 111 82  BILITOT 0.5 1.3*  PROT 6.4* 5.2*  ALBUMIN 2.0* 1.9*   No results for input(s): LIPASE, AMYLASE in the last 168 hours. No results for input(s): AMMONIA in the last 168 hours. Coagulation Profile:  Recent Labs Lab 09/18/16 1035  INR 1.54   Cardiac Enzymes: No results for input(s): CKTOTAL, CKMB, CKMBINDEX, TROPONINI in the last 168 hours. BNP (last 3 results) No results for input(s): PROBNP in the last 8760 hours. HbA1C: No results for input(s): HGBA1C in the last 72  hours. CBG:  Recent Labs Lab 09/18/16 2113 09/19/16 0614 09/19/16 1115  GLUCAP 130* 125* 109*   Lipid Profile: No results for input(s): CHOL, HDL, LDLCALC, TRIG, CHOLHDL, LDLDIRECT in the last 72 hours. Thyroid Function Tests: No results for input(s): TSH, T4TOTAL, FREET4, T3FREE, THYROIDAB in the last 72 hours. Anemia Panel:  Recent Labs  09/19/16 0937  VITAMINB12 3,160*  FOLATE 8.1  FERRITIN 261  TIBC 193*  IRON 18*   Sepsis Labs: No results for input(s): PROCALCITON, LATICACIDVEN in the last 168 hours.  No results found for this or any previous visit (from the past 240 hour(s)).       Radiology Studies: Dg Chest Port 1 View  Result Date: 09/18/2016 CLINICAL DATA:  Postoperative radiograph, for generalized chest  pain, acute onset. Initial encounter. 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 The lungs are otherwise grossly clear. No pleural effusion or pneumothorax is seen. The cardiomediastinal silhouette is normal in size. A right-sided chest port is noted ending about the mid SVC. No acute osseous abnormalities are identified. IMPRESSION: Large left-sided diaphragmatic hernia again noted, with associated atelectasis. Lungs otherwise grossly clear. Electronically Signed   By: Garald Balding M.D.   On: 09/18/2016 21:41        Scheduled Meds: . Chlorhexidine Gluconate Cloth  6 each Topical Once  . insulin aspart  0-5 Units Subcutaneous QHS  . insulin aspart  0-9 Units Subcutaneous TID WC  . polyethylene glycol  17 g Oral Daily  . sodium chloride flush  3 mL Intravenous Q12H  . sodium polystyrene  30 g Oral Once  . white petrolatum       Continuous Infusions: . sodium chloride    . lactated ringers 100 mL/hr at 09/19/16 0941     LOS: 1 day    Time spent: 34 minutes    THOMPSON,DANIEL, MD [facility] Pager  [phone]  If 7PM-7AM, please contact night-coverage [email or web address] Password [facility] 09/19/2016, 12:47 PM

## 2016-09-19 NOTE — Evaluation (Signed)
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 PMH: L LE DVT/pulmonary emboli, GIB, anemia, recent dx of gastric cancer.  Clinical Impression  Pt admitted with above diagnosis and presents to PT with functional limitations due to deficits listed below (See PT problem list). Pt needs skilled PT to maximize independence and safety to allow discharge to CIR prior to return home. Pt was very independent and very motivated to return to independence. Excellent CIR candidate.     Follow Up Recommendations CIR    Equipment Recommendations  Rolling walker with 5" wheels;Wheelchair (measurements PT);Wheelchair cushion (measurements PT)    Recommendations for Other Services       Precautions / Restrictions Precautions Precautions: Fall Restrictions Weight Bearing Restrictions: No      Mobility  Bed Mobility Overal bed mobility: Needs Assistance Bed Mobility: Supine to Sit     Supine to sit: +2 for physical assistance;Min assist     General bed mobility comments: cues for technique, use of pad to assist hips to EOB and to raise trunk  Transfers Overall transfer level: Needs assistance Equipment used: Rolling walker (2 wheeled) Transfers: Sit to/from [employer] Transfers Sit to Stand: +2 physical assistance;Min assist Stand pivot transfers: +2 physical assistance;Min assist       General transfer comment: cues for hand placement and technique, assist to rise and steady as pt pivoted toward R to chair. Pt used walker for pivot from bed to chair. Pt initially pivoting on rt foot and then able to take a few backward hops to the chair.  Ambulation/Gait                Stairs            Wheelchair Mobility    Modified Rankin (Stroke Patients Only)       Balance Overall balance  assessment: Needs assistance Sitting-balance support: No upper extremity supported;Feet unsupported Sitting balance-Leahy Scale: Fair     Standing balance support: Bilateral upper extremity supported Standing balance-Leahy Scale: Poor Standing balance comment: walker and min A for static standing                             Pertinent Vitals/Pain Pain Assessment: 0-10 Pain Location: 3 Pain Descriptors / Indicators: Sore;Operative site guarding Pain Intervention(s): Monitored during session;Limited activity within patient's tolerance;Repositioned    Home Living Family/patient expects to be discharged to:: Private residence Living Arrangements: Alone Available Help at Discharge: Neighbor;Available PRN/intermittently (family is here from [place] temporarily) Type of Home: House Home Access: Stairs to enter Entrance Stairs-Rails: None Entrance Stairs-Number of Steps: 2 Home Layout: Two level;Able to live on main level with bedroom/bathroom Home Equipment: None      Prior Function Level of Independence: Independent         Comments: works as a Microbiologist   Dominant Hand: Right    Extremity/Trunk Assessment   Upper Extremity Assessment Upper Extremity Assessment: Defer to OT evaluation    Lower Extremity Assessment Lower Extremity Assessment: LLE deficits/detail LLE Deficits / Details: AKA. Strength limited by pain       Communication   Communication: No difficulties  Cognition Arousal/Alertness: Awake/alert Behavior During Therapy: WFL for tasks assessed/performed Overall Cognitive Status: Within Functional Limits for tasks assessed  General Comments      Exercises     Assessment/Plan    PT Assessment Patient needs continued PT services  PT Problem List Decreased strength;Decreased activity tolerance;Decreased balance;Decreased mobility;Decreased knowledge of use of  DME;Pain       PT Treatment Interventions DME instruction;Gait training;Functional mobility training;Therapeutic activities;Therapeutic exercise;Balance training;Patient/family education;Wheelchair mobility training    PT Goals (Current goals can be found in the Care Plan section)  Acute Rehab PT Goals Patient Stated Goal: to regain indepedence PT Goal Formulation: With patient Time For Goal Achievement: 09/26/16 Potential to Achieve Goals: Good    Frequency Min 3X/week   Barriers to discharge Inaccessible home environment;Decreased caregiver support Lives alone and stairs to enter    Co-evaluation PT/OT/SLP Co-Evaluation/Treatment: Yes Reason for Co-Treatment: For patient/therapist safety PT goals addressed during session: Mobility/safety with mobility         AM-PAC PT "6 Clicks" Daily Activity  Outcome Measure Difficulty turning over in bed (including adjusting bedclothes, sheets and blankets)?: Total Difficulty moving from lying on back to sitting on the side of the bed? : Total Difficulty sitting down on and standing up from a chair with arms (e.g., wheelchair, bedside commode, etc,.)?: Total Help needed moving to and from a bed to chair (including a wheelchair)?: A Lot Help needed walking in hospital room?: Total Help needed climbing 3-5 steps with a railing? : Total 6 Click Score: 7    End of Session Equipment Utilized During Treatment: Gait belt Activity Tolerance: Patient tolerated treatment well Patient left: in chair;with call bell/phone within reach;with chair alarm set Nurse Communication: Mobility status PT Visit Diagnosis: Other abnormalities of gait and mobility (R26.89);Unsteadiness on feet (R26.81)    Time: 2956-2130 PT Time Calculation (min) (ACUTE ONLY): 23 min   Charges:   PT Evaluation $PT Eval Moderate Complexity: 1 Procedure     PT G CodesMarland Kitchen        [facility] [place] 09/19/2016, 1:46 PM

## 2016-09-19 NOTE — Progress Notes (Signed)
2 letters written for family and emailed signed copies to the family.

## 2016-09-19 NOTE — Progress Notes (Addendum)
  Progress Note  SUBJECTIVE:    POD #1  No complaints.   OBJECTIVE:   Vitals:   09/18/16 2032 09/19/16 0551  BP: (!) 91/56 95/60  Pulse: 95 91  Resp: 18 18  Temp: 97.5 F (36.4 C) 97.7 F (36.5 C)    Intake/Output Summary (Last 24 hours) at 09/19/16 0753 Last data filed at 09/18/16 2300  Gross per 24 hour  Intake             2870 ml  Output              440 ml  Net             2430 ml   Left AKA dressing saturated with serosanguinous dressing Serous drainage from drain exit site lateral left leg, ~10 cc drainage in JP Incision without active drainage  ASSESSMENT/PLAN:   66 y.o. female is s/p: left AKA 1 Day Post-Op   Keep JP in today. Dressing taken down given serosanginuous drainage from drain exit site. Incision healing well.  Change dressing as needed once soiled.  WBC improving.  PT and OT today  Alvia Grove 09/19/2016 7:53 AM --  Agree with above.  Will follow drain output for now. PT OT today  Hopefully rehab next 1-2 days  Ruta Hinds, MD [facility] Office: [phone] Pager: [phone]  LABS:   CBC    Component Value Date/Time   WBC 20.8 (H) 09/19/2016 0252   HGB 7.8 (L) 09/19/2016 0252   HCT 23.6 (L) 09/19/2016 0252   PLT 64 (L) 09/19/2016 0252    BMET    Component Value Date/Time   NA 133 (L) 09/19/2016 0252   K 5.4 (H) 09/19/2016 0252   CL 107 09/19/2016 0252   CO2 19 (L) 09/19/2016 0252   GLUCOSE 105 (H) 09/19/2016 0252   BUN 43 (H) 09/19/2016 0252   CREATININE 1.29 (H) 09/19/2016 0252   CALCIUM 8.0 (L) 09/19/2016 0252   GFRNONAA 43 (L) 09/19/2016 0252   GFRAA 50 (L) 09/19/2016 0252    COAG Lab Results  Component Value Date   INR 1.54 09/18/2016   INR 1.47 09/11/2016   INR 1.37 08/30/2016   No results found for: PTT  ANTIBIOTICS:   Anti-infectives    Start     Dose/Rate Route Frequency Ordered Stop   09/18/16 2200  cefUROXime (ZINACEF) 1.5 g in dextrose 5 % 50 mL IVPB      1.5 g 100 mL/hr over 30 Minutes Intravenous Every 12 hours 09/18/16 1829 09/19/16 2159   09/18/16 1024  dextrose 5 % with cefUROXime (ZINACEF) ADS Med    Comments:  Schonewitz, Leigh   : cabinet override      09/18/16 1024 09/18/16 1439   09/18/16 1020  cefUROXime (ZINACEF) 1.5 g in dextrose 5 % 50 mL IVPB     1.5 g 100 mL/hr over 30 Minutes Intravenous 30 min pre-op 09/18/16 1020 09/18/16 [place], PA-C [facility] Office: [phone] Pager: [phone] 09/19/2016 7:53 AM

## 2016-09-19 NOTE — Progress Notes (Signed)
Patient with 3 large BM's after receiving Kayexalate.

## 2016-09-19 NOTE — Plan of Care (Signed)
Problem: Activity: Goal: Risk for activity intolerance will decrease Outcome: Progressing Up to chair and BSC; tolerated well

## 2016-09-19 NOTE — Evaluation (Signed)
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 PMH: L LE DVT/pulmonary emboli, GIB, anemia, recent dx of gastric cancer.   Clinical Impression   Pt was independent prior to admission. Presents with L LE pain and decreased balance. She requires supervision to max assist with ADL and 2 person min assist for mobility. Pt has excellent potential to function at a modified independent level with intensive rehab. Recommending CIR. Will follow acutely.    Follow Up Recommendations  CIR    Equipment Recommendations  3 in 1 bedside commode;Wheelchair (measurements OT);Wheelchair cushion (measurements OT);Tub/shower bench    Recommendations for Other Services Rehab consult     Precautions / Restrictions Precautions Precautions: Fall Restrictions Weight Bearing Restrictions: No      Mobility Bed Mobility Overal bed mobility: Needs Assistance Bed Mobility: Supine to Sit     Supine to sit: +2 for physical assistance;Min assist     General bed mobility comments: cues for technique, use of pad to assist hips to EOB and to raise trunk  Transfers Overall transfer level: Needs assistance Equipment used: Rolling walker (2 wheeled) Transfers: Sit to/from [employer] Transfers Sit to Stand: +2 physical assistance;Min assist Stand pivot transfers: +2 physical assistance;Min assist       General transfer comment: cues for hand placement and technique, assist to rise and steady as pt pivoted toward R to chair    Balance Overall balance assessment: Needs assistance   Sitting balance-Leahy Scale: Fair       Standing balance-Leahy Scale: Poor                             ADL either performed or assessed with clinical judgement   ADL Overall ADL's : Needs assistance/impaired Eating/Feeding:  Independent;Sitting   Grooming: Sitting;Supervision/safety;Set up   Upper Body Bathing: Supervision/ safety;Sitting   Lower Body Bathing: Maximal assistance;Sitting/lateral leans   Upper Body Dressing : Supervision/safety;Sitting   Lower Body Dressing: Maximal assistance;Sitting/lateral leans   Toilet Transfer: +2 for physical assistance;Minimal assistance;RW;BSC;Stand-pivot   Toileting- Clothing Manipulation and Hygiene: Maximal assistance;Sitting/lateral lean               Vision Patient Visual Report: No change from baseline       Perception     Praxis      Pertinent Vitals/Pain Pain Assessment: 0-10 Pain Location: 3 Pain Descriptors / Indicators: Sore;Operative site guarding Pain Intervention(s): Monitored during session;Repositioned     Hand Dominance Right   Extremity/Trunk Assessment Upper Extremity Assessment Upper Extremity Assessment: Overall WFL for tasks assessed   Lower Extremity Assessment Lower Extremity Assessment: Defer to PT evaluation       Communication Communication Communication: No difficulties   Cognition Arousal/Alertness: Awake/alert Behavior During Therapy: WFL for tasks assessed/performed Overall Cognitive Status: Within Functional Limits for tasks assessed                                     General Comments       Exercises     Shoulder Instructions      Home Living Family/patient expects to be discharged to:: Private residence Living Arrangements: Alone Available Help at Discharge: Neighbor;Available PRN/intermittently (family is here from [place] temporarily) Type of Home: House  Home Access: Stairs to enter Entrance Stairs-Number of Steps: 2 Entrance Stairs-Rails: None Home Layout: Two level;Able to live on main level with bedroom/bathroom     Bathroom Shower/Tub: Teacher, early years/pre: Standard     Home Equipment: None          Prior Functioning/Environment Level of  Independence: Independent        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 Problem List: Decreased strength;Decreased activity tolerance;Impaired balance (sitting and/or standing);Decreased knowledge of use of DME or AE;Pain      OT Treatment/Interventions: Self-care/ADL training;Balance training;Patient/family education;DME and/or AE instruction;Therapeutic activities    OT Goals(Current goals can be found in the care plan section) Acute Rehab OT Goals Patient Stated Goal: to regain indepedence OT Goal Formulation: With patient Time For Goal Achievement: 10/03/16 Potential to Achieve Goals: Good ADL Goals Pt Will Perform Lower Body Bathing: with supervision;sitting/lateral leans Pt Will Perform Lower Body Dressing: with supervision;sitting/lateral leans Pt Will Transfer to Toilet: with supervision;ambulating;bedside commode (over toilet) Pt Will Perform Toileting - Clothing Manipulation and hygiene: with supervision;sitting/lateral leans Additional ADL Goal #1: Pt will perform bed mobility with supervision.  OT Frequency: Min 2X/week   Barriers to D/C:            Co-evaluation PT/OT/SLP Co-Evaluation/Treatment: Yes Reason for Co-Treatment: For patient/therapist safety          AM-PAC PT "6 Clicks" Daily Activity     Outcome Measure Help from another person eating meals?: None Help from another person taking care of personal grooming?: A Little Help from another person toileting, which includes using toliet, bedpan, or urinal?: A Lot Help from another person bathing (including washing, rinsing, drying)?: A Lot Help from another person to put on and taking off regular upper body clothing?: A Little Help from another person to put on and taking off regular lower body clothing?: A Lot 6 Click Score: 16   End of Session Equipment Utilized During Treatment: Rolling walker;Gait belt Nurse Communication: Mobility status  Activity Tolerance: Patient tolerated  treatment well Patient left: in chair;with call bell/phone within reach;with chair alarm set  OT Visit Diagnosis: Unsteadiness on feet (R26.81);Pain Pain - Right/Left: Left Pain - part of body: Leg                Time: 1135-1157 OT Time Calculation (min): 22 min Charges:  OT General Charges $OT Visit: 1 Procedure OT Evaluation $OT Eval Moderate Complexity: 1 Procedure G-Codes:      Malka So 09/19/2016, 1:09 PM  [phone]

## 2016-09-19 NOTE — Consult Note (Signed)
Physical Medicine and Rehabilitation Consult Reason for Consult: Decreased functional mobility Referring Physician: Dr. Rockne Menghini   HPI: Jodi Collins is a 66 y.o. right handed female who lives alone and was independent prior to admission with close his family in [place]. 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 Recently seen in the ER with shortness of breath cough left lower extremity swelling found to have right sided segmental and subsegmental emboli started on heparin therapy discontinued after finding anemia possible GI bleed she did receive a transfusion. During that workup CT abdomen and pelvis showed gastric/stomach cancer confirmed by EGD 08/30/2016 she did receive an IVC filter. A referral was made to outpatient oncology services for plans of chemotherapy. On 09/08/2016 patient with increasing left leg pain swelling and rest pain. Noted necrotic changes left lower extremity and no Doppler signal. 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 Ruta Hinds. Hospital course pain management. Latest hemoglobin 7.8 as well as leukocytosis 20,800. Hyperkalemia received [place]. Physical and occupational therapy evaluations are pending. M.D. has requested physical medicine rehabilitation consult.   Review of Systems  Constitutional: Negative for chills and fever.  HENT: Negative for hearing loss.   Eyes: Negative for blurred vision and double vision.  Respiratory: Negative for cough and shortness of breath.   Cardiovascular: Positive for leg swelling. Negative for chest pain and palpitations.  Gastrointestinal: Positive for melena. Negative for nausea and vomiting.  Genitourinary: Negative for dysuria, flank pain and hematuria.  Musculoskeletal: Positive for myalgias.  Skin: Negative for rash.  Neurological: Positive for weakness. Negative for seizures.  All other systems reviewed  and are negative.  Past Medical History:  Diagnosis Date  . Anemia   . DVT (deep venous thrombosis) ([facility]) 08/17/2016  . 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 GI bleed   . Hyperkalemia   . Hyponatremia   . PE (pulmonary thromboembolism) ([facility]) 08/28/2016  . 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 Thrombocytopenia ([facility])    Past Surgical History:  Procedure Laterality Date  . CERVICAL CONIZATION W/BX  2012   high grade squamous intraepithelial dysplasia.  Dr Garwin Brothers.   . COLONOSCOPY W/ POLYPECTOMY  10/2006 and 10/2011   Dr Collene Mares.  had adenomatous polyps.    . ESOPHAGOGASTRODUODENOSCOPY N/A 08/30/2016   Procedure: ESOPHAGOGASTRODUODENOSCOPY (EGD);  Surgeon: Carol Ada, MD;  Location: [facility];  Service: Gastroenterology;  Laterality: N/A;  . IR FLUORO GUIDE PORT INSERTION RIGHT  09/01/2016  . IR IVC FILTER PLMT / S&I /IMG GUID/MOD SED  08/29/2016  . IR US GUIDE VASC ACCESS RIGHT  09/01/2016   Family History  Problem Relation Age of Onset  . Hypertension Mother   . Diabetes Mother    Social History:  reports that she has never smoked. She has never used smokeless tobacco. She reports that she drinks alcohol. She reports that she does not use drugs. Allergies:  Allergies  Allergen Reactions  . No Known Allergies    Medications Prior to Admission  Medication Sig Dispense Refill  . oxyCODONE-acetaminophen (PERCOCET/ROXICET) 5-325 MG tablet Take 1-2 tablets by mouth every 4 (four) hours as needed for severe pain. 90 tablet 0  . polyethylene glycol powder (GLYCOLAX/MIRALAX) powder 1 capful daily as needed (Patient taking differently: Take 17 g by mouth daily. ) 255 g 2  . dexamethasone (DECADRON) 4 MG tablet Take  2 tablets (8 mg total) by mouth daily. Start the day after chemotherapy for 2 days. Take with food. 30 tablet 1  . fluorouracil CALGB 24580 in sodium chloride 0.9 % 150 mL Inject into the vein over 96 hr. Every 2 weeks, wears pump for 46 hours      . leucovorin in dextrose 5 % 250 mL Inject into the vein once. Every 2 weeks    . lidocaine-prilocaine (EMLA) cream Apply to affected area once 30 g 3  . Misc. Devices MISC Wheelchair 1 each 0  . ondansetron (ZOFRAN) 8 MG tablet Take 1 tablet (8 mg total) by mouth 2 (two) times daily as needed for refractory nausea / vomiting. Start on day 3 after chemotherapy. 30 tablet 1  . OXALIPLATIN IV Inject into the vein. Every 2 weeks    . prochlorperazine (COMPAZINE) 10 MG tablet Take 1 tablet (10 mg total) by mouth every 6 (six) hours as needed (Nausea or vomiting). 30 tablet 1    Home:    Functional History:   Functional Status:  Mobility:          ADL:    Cognition: Cognition Orientation Level: Oriented X4    Blood pressure 95/60, pulse 91, temperature 97.7 F (36.5 C), temperature source Oral, resp. rate 18, height 5\' 2"  (1.575 m), weight 63.5 kg (140 lb), SpO2 100 %. Physical Exam  Vitals reviewed. Constitutional: She is oriented to person, place, and time. She appears well-developed and well-nourished.  HENT:  Head: Normocephalic and atraumatic.  Eyes: EOM are normal. Right eye exhibits no discharge. Left eye exhibits no discharge.  Neck: Normal range of motion. Neck supple. No thyromegaly present.  Cardiovascular: Normal rate, regular rhythm and normal heart sounds.   Respiratory: Effort normal and breath sounds normal. No respiratory distress.  GI: Soft. Bowel sounds are normal. She exhibits no distension.  Musculoskeletal: She exhibits edema and tenderness.  Neurological: She is alert and oriented to person, place, and time.  Motor: B/l UE 5/5 proximal to distal RLE: 4+/5 proximal to distal LLE: HF 4/5 proximal to distal Sensation intact to light touch  Skin: Skin is warm and dry.  Amputation site is dressed appropriately tender  Psychiatric: She has a normal mood and affect. Her behavior is normal.    Results for orders placed or performed during the hospital  encounter of 09/18/16 (from the past 24 hour(s))  APTT     Status: Abnormal   Collection Time: 09/18/16 10:35 AM  Result Value Ref Range   aPTT 37 (H) 24 - 36 seconds  CBC     Status: Abnormal   Collection Time: 09/18/16 10:35 AM  Result Value Ref Range   WBC 23.1 (H) 4.0 - 10.5 K/uL   RBC 2.60 (L) 3.87 - 5.11 MIL/uL   Hemoglobin 6.7 (LL) 12.0 - 15.0 g/dL   HCT 21.9 (L) 36.0 - 46.0 %   MCV 84.2 78.0 - 100.0 fL   MCH 25.8 (L) 26.0 - 34.0 pg   MCHC 30.6 30.0 - 36.0 g/dL   RDW 18.7 (H) 11.5 - 15.5 %   Platelets 96 (L) 150 - 400 K/uL  Comprehensive metabolic panel     Status: Abnormal   Collection Time: 09/18/16 10:35 AM  Result Value Ref Range   Sodium 131 (L) 135 - 145 mmol/L   Potassium 6.0 (H) 3.5 - 5.1 mmol/L   Chloride 104 101 - 111 mmol/L   CO2 16 (L) 22 - 32 mmol/L   Glucose, Bld  117 (H) 65 - 99 mg/dL   BUN 41 (H) 6 - 20 mg/dL   Creatinine, Ser 1.35 (H) 0.44 - 1.00 mg/dL   Calcium 8.6 (L) 8.9 - 10.3 mg/dL   Total Protein 6.4 (L) 6.5 - 8.1 g/dL   Albumin 2.0 (L) 3.5 - 5.0 g/dL   AST 36 15 - 41 U/L   ALT 30 14 - 54 U/L   Alkaline Phosphatase 111 38 - 126 U/L   Total Bilirubin 0.5 0.3 - 1.2 mg/dL   GFR calc non Af Amer 41 (L) >60 mL/min   GFR calc Af Amer 47 (L) >60 mL/min   Anion gap 11 5 - 15  Protime-INR     Status: Abnormal   Collection Time: 09/18/16 10:35 AM  Result Value Ref Range   Prothrombin Time 18.6 (H) 11.4 - 15.2 seconds   INR 1.54   Type and screen [place]     Status: None (Preliminary result)   Collection Time: 09/18/16 11:31 AM  Result Value Ref Range   ABO/RH(D) A POS    Antibody Screen NEG    Sample Expiration 09/21/2016    Unit Number [id number]    Blood Component Type RED CELLS,LR    Unit division 00    Status of Unit ISSUED    Transfusion Status OK TO TRANSFUSE    Crossmatch Result COMPATIBLE    Unit Number [id number]    Blood Component Type RED CELLS,LR    Unit division 00    Status of Unit ISSUED     Transfusion Status OK TO TRANSFUSE    Crossmatch Result COMPATIBLE    Unit Number [id number]    Blood Component Type RED CELLS,LR    Unit division 00    Status of Unit ALLOCATED    Transfusion Status OK TO TRANSFUSE    Crossmatch Result COMPATIBLE    Unit Number [id number]    Blood Component Type RED CELLS,LR    Unit division 00    Status of Unit ALLOCATED    Transfusion Status OK TO TRANSFUSE    Crossmatch Result COMPATIBLE   Prepare RBC     Status: None   Collection Time: 09/18/16 11:31 AM  Result Value Ref Range   Order Confirmation ORDER PROCESSED BY BLOOD BANK   Glucose, capillary     Status: Abnormal   Collection Time: 09/18/16  9:13 PM  Result Value Ref Range   Glucose-Capillary 130 (H) 65 - 99 mg/dL   Comment 1 Notify RN   CBC     Status: Abnormal   Collection Time: 09/18/16  9:49 PM  Result Value Ref Range   WBC 23.6 (H) 4.0 - 10.5 K/uL   RBC 3.00 (L) 3.87 - 5.11 MIL/uL   Hemoglobin 8.5 (L) 12.0 - 15.0 g/dL   HCT 25.9 (L) 36.0 - 46.0 %   MCV 86.3 78.0 - 100.0 fL   MCH 28.3 26.0 - 34.0 pg   MCHC 32.8 30.0 - 36.0 g/dL   RDW 15.9 (H) 11.5 - 15.5 %   Platelets 64 (L) 150 - 400 K/uL  Basic metabolic panel     Status: Abnormal   Collection Time: 09/18/16  9:49 PM  Result Value Ref Range   Sodium 132 (L) 135 - 145 mmol/L   Potassium 5.4 (H) 3.5 - 5.1 mmol/L   Chloride 105 101 - 111 mmol/L   CO2 19 (L) 22 - 32 mmol/L   Glucose, Bld 127 (H) 65 - 99 mg/dL  BUN 41 (H) 6 - 20 mg/dL   Creatinine, Ser 1.35 (H) 0.44 - 1.00 mg/dL   Calcium 8.1 (L) 8.9 - 10.3 mg/dL   GFR calc non Af Amer 41 (L) >60 mL/min   GFR calc Af Amer 47 (L) >60 mL/min   Anion gap 8 5 - 15  Comprehensive metabolic panel     Status: Abnormal   Collection Time: 09/19/16  2:52 AM  Result Value Ref Range   Sodium 133 (L) 135 - 145 mmol/L   Potassium 5.4 (H) 3.5 - 5.1 mmol/L   Chloride 107 101 - 111 mmol/L   CO2 19 (L) 22 - 32 mmol/L   Glucose, Bld 105 (H) 65 - 99 mg/dL   BUN 43 (H) 6 - 20  mg/dL   Creatinine, Ser 1.29 (H) 0.44 - 1.00 mg/dL   Calcium 8.0 (L) 8.9 - 10.3 mg/dL   Total Protein 5.2 (L) 6.5 - 8.1 g/dL   Albumin 1.9 (L) 3.5 - 5.0 g/dL   AST 40 15 - 41 U/L   ALT 20 14 - 54 U/L   Alkaline Phosphatase 82 38 - 126 U/L   Total Bilirubin 1.3 (H) 0.3 - 1.2 mg/dL   GFR calc non Af Amer 43 (L) >60 mL/min   GFR calc Af Amer 50 (L) >60 mL/min   Anion gap 7 5 - 15  CBC     Status: Abnormal   Collection Time: 09/19/16  2:52 AM  Result Value Ref Range   WBC 20.8 (H) 4.0 - 10.5 K/uL   RBC 2.76 (L) 3.87 - 5.11 MIL/uL   Hemoglobin 7.8 (L) 12.0 - 15.0 g/dL   HCT 23.6 (L) 36.0 - 46.0 %   MCV 85.5 78.0 - 100.0 fL   MCH 28.3 26.0 - 34.0 pg   MCHC 33.1 30.0 - 36.0 g/dL   RDW 16.4 (H) 11.5 - 15.5 %   Platelets 64 (L) 150 - 400 K/uL  Glucose, capillary     Status: Abnormal   Collection Time: 09/19/16  6:14 AM  Result Value Ref Range   Glucose-Capillary 125 (H) 65 - 99 mg/dL   Comment 1 Notify RN    Dg Chest Port 1 View  Result Date: 09/18/2016 CLINICAL DATA:  Postoperative radiograph, for generalized chest pain, acute onset. Initial encounter. 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 The lungs are otherwise grossly clear. No pleural effusion or pneumothorax is seen. The cardiomediastinal silhouette is normal in size. A right-sided chest port is noted ending about the mid SVC. No acute osseous abnormalities are identified. IMPRESSION: Large left-sided diaphragmatic hernia again noted, with associated atelectasis. Lungs otherwise grossly clear. Electronically Signed   By: Garald Balding M.D.   On: 09/18/2016 21:41    Assessment/Plan: Diagnosis: Left AKA Labs independently reviewed.  Records reviewed and summated above. Clean amputation daily with soap and water Monitor incision site for signs of infection or impending skin breakdown. Staples to  remain in place for 3-4 weeks Stump shrinker, for edema control  Scar mobilization massaging to prevent soft tissue adherence Stump protector during therapies Prevent flexion contractures by implementing the following:   Encourage prone lying for 20-30 mins per day BID to avoid hip flexion contractures if medically appropriate  Avoid prolonged sitting Post surgical pain control with oral medication Phantom limb pain control with physical modalities including desensitization techniques (gentle self massage to the residual stump,hot packs  if sensation intact, [place]) and mirror therapy, TENS. If ineffective, consider pharmacological treatment for neuropathic pain (e.g gabapentin, pregabalin, amytriptalyine, duloxetine).  Avoid injury to contralateral side  1. Does the need for close, 24 hr/day medical supervision in concert with the patient's rehab needs make it unreasonable for this patient to be served in a less intensive setting? Potentially  2. Co-Morbidities requiring supervision/potential complications: Hyperkalemia (cont to monitor, treat if necessary), ABLA (transfuse if necessary to ensure appropriate perfusion for increased activity tolerance), leukocytosis (cont to monitor for signs and symptoms of infection, further workup if indicated), post-op pain management (Biofeedback training with therapies to help reduce reliance on opiate pain medications, monitor pain control during therapies, and sedation at rest and titrate to maximum efficacy to ensure participation and gains in therapies), gastric/stomach cancer (follow up Heme/Onc), history of DVT /pulmonary emboli (Xarelto but discontinued due to melena), CKD (avoid nephrotoxic meds), Thrombocytopenia (< 60,000/mm3 no resistive exercise) 3. Due to safety, skin/wound care, disease management, pain management and patient education, does the patient require 24 hr/day rehab nursing? Yes 4. Does the patient require coordinated care of a physician,  rehab nurse, PT (1-2 hrs/day, 5 days/week) and OT (1-2 hrs/day, 5 days/week) to address physical and functional deficits in the context of the above medical diagnosis(es)? Yes Addressing deficits in the following areas: balance, endurance, locomotion, strength, transferring, bowel/bladder control, bathing, dressing, toileting and psychosocial support 5. Can the patient actively participate in an intensive therapy program of at least 3 hrs of therapy per day at least 5 days per week? Potentially 6. The potential for patient to make measurable gains while on inpatient rehab is excellent 7. Anticipated functional outcomes upon discharge from inpatient rehab are TBD  with PT, TBD with OT, n/a with SLP. 8. Estimated rehab length of stay to reach the above functional goals is: TBD 9. Anticipated D/C setting: Home 10. Anticipated post D/C treatments: HH therapy and Home excercise program 11. Overall Rehab/Functional Prognosis: good  RECOMMENDATIONS: This patient's condition is appropriate for continued rehabilitative care in the following setting: Will await therapy evals.  Likely CIR Patient has agreed to participate in recommended program. Yes Note that insurance prior authorization may be required for reimbursement for recommended care.  Comment: Rehab Admissions Coordinator to follow up.  Delice Lesch, MD, Mellody Drown Cathlyn Parsons., PA-C 09/19/2016

## 2016-09-20 DIAGNOSIS — Z89612 Acquired absence of left leg above knee: Secondary | ICD-10-CM

## 2016-09-20 LAB — BASIC METABOLIC PANEL
Anion gap: 10 (ref 5–15)
BUN: 37 mg/dL — AB (ref 6–20)
CALCIUM: 7.9 mg/dL — AB (ref 8.9–10.3)
CO2: 18 mmol/L — ABNORMAL LOW (ref 22–32)
CREATININE: 0.96 mg/dL (ref 0.44–1.00)
Chloride: 106 mmol/L (ref 101–111)
GFR calc Af Amer: >60 mL/min (ref >60)
GLUCOSE: 101 mg/dL — AB (ref 65–99)
Potassium: 3 mmol/L — ABNORMAL LOW (ref 3.5–5.1)
Sodium: 134 mmol/L — ABNORMAL LOW (ref 135–145)

## 2016-09-20 LAB — GLUCOSE, CAPILLARY
GLUCOSE-CAPILLARY: 116 mg/dL — AB (ref 65–99)
GLUCOSE-CAPILLARY: 107 mg/dL — AB (ref 65–99)
GLUCOSE-CAPILLARY: 97 mg/dL (ref 65–99)
Glucose-Capillary: 83 mg/dL (ref 65–99)

## 2016-09-20 LAB — CBC
HCT: 22.3 % — ABNORMAL LOW (ref 36.0–46.0)
Hemoglobin: 7.1 g/dL — ABNORMAL LOW (ref 12.0–15.0)
MCH: 28 pg (ref 26.0–34.0)
MCHC: 31.8 g/dL (ref 30.0–36.0)
MCV: 87.8 fL (ref 78.0–100.0)
Platelets: 63 10*3/uL — ABNORMAL LOW (ref 150–400)
RBC: 2.54 MIL/uL — ABNORMAL LOW (ref 3.87–5.11)
RDW: 17.5 % — AB (ref 11.5–15.5)
WBC: 18.1 10*3/uL — ABNORMAL HIGH (ref 4.0–10.5)

## 2016-09-20 LAB — HEMOGLOBIN A1C
HEMOGLOBIN A1C: 5.1 % (ref 4.8–5.6)
MEAN PLASMA GLUCOSE: 100 mg/dL

## 2016-09-20 LAB — URINE CULTURE

## 2016-09-20 MED ORDER — POTASSIUM CHLORIDE CRYS ER 20 MEQ PO TBCR: 80.0000 meq | EXTENDED_RELEASE_TABLET | Freq: Once | ORAL | Status: DC

## 2016-09-20 MED ORDER — POTASSIUM CHLORIDE CRYS ER 20 MEQ PO TBCR
40.0000 meq | EXTENDED_RELEASE_TABLET | Freq: Once | ORAL | Status: AC
  Administered 2016-09-20: 40 meq via ORAL
  Filled 2016-09-20: qty 2

## 2016-09-20 NOTE — Progress Notes (Signed)
   VASCULAR SURGERY ASSESSMENT & PLAN:   2 Days Post-Op s/p: Left AKA.  Dressing changed and drain removed.  PTx/ CIR  Acute blood loss anemia: Hgb = 7.1 today.   SUBJECTIVE:   Pain adequately controlled.  PHYSICAL EXAM:   Vitals:   09/18/16 2032 09/19/16 0551 09/19/16 1950 09/20/16 0514  BP: (!) 91/56 95/60 (!) 90/59 96/61  Pulse: 95 91 (!) 102 99  Resp: 18 18 20 18   Temp: 97.5 F (36.4 C) 97.7 F (36.5 C) 97.9 F (36.6 C) 97.6 F (36.4 C)  TempSrc: Oral Oral Oral Oral  SpO2: 95% 100% 96% 96%  Weight:      Height:       Left AKA looks good JP min drainage.   LABS:   Lab Results  Component Value Date   WBC 18.1 (H) 09/20/2016   HGB 7.1 (L) 09/20/2016   HCT 22.3 (L) 09/20/2016   MCV 87.8 09/20/2016   PLT 63 (L) 09/20/2016   Lab Results  Component Value Date   CREATININE 0.96 09/20/2016   Lab Results  Component Value Date   INR 1.54 09/18/2016   CBG (last 3)   Recent Labs  09/19/16 1629 09/19/16 2136 09/20/16 0627  GLUCAP 126* 111* 116*    PROBLEM LIST:    Principal Problem:   S/P AKA (above knee amputation) unilateral, left ([facility]) Active Problems:   Anemia   Gastric cancer ([facility])   Left leg DVT ([facility])   Hyperkalemia   Hyponatremia   Acute kidney injury ([place])   GI bleed   Leukocytosis   Post-op pain   Acute blood loss anemia   Thrombocytopenia ([facility]CC)   Stage 3 chronic kidney disease   CURRENT MEDS:   . feeding supplement (ENSURE ENLIVE)  237 mL Oral BID BM  . insulin aspart  0-5 Units Subcutaneous QHS  . insulin aspart  0-9 Units Subcutaneous TID WC  . polyethylene glycol  17 g Oral Daily  . sodium chloride flush  3 mL Intravenous Q12H  . sodium polystyrene  30 g Oral Once    Gae Gallop Beeper: [phone] Office: [phone] 09/20/2016

## 2016-09-20 NOTE — PMR Pre-admission (Signed)
PMR Admission Coordinator Pre-Admission Assessment  Patient: Jodi Collins is an 66 y.o., female MRN: [id number] DOB: 1950-05-27 Height: 5\' 2"  (157.5 cm) Weight: 63.5 kg (140 lb)              Insurance Information HMO:     PPO: yes     PCP:      IPA:      80/20:      OTHER:  PRIMARY: [employer]      Policy#: [id number]      Subscriber: pt CM Name: Sherlynn Stalls      Phone#: [phone]     Fax#: EPIC access Pre-Cert#: [id number]      Employer: [employer] Benefits:  Phone #: [phone]     Name: 09/19/16 Eff. Date: 01/19/16     Deduct: $1000      Out of Pocket Max: $4000      Life Max: none CIR: 70%      SNF: 70% 60 days Outpatient: $25 per visit     Co-Pay: 23 each of PT and OT, separate 30 of SLP Home Health: 70%      Co-Pay: 60 visits combined DME: 70%     Co-Pay: 30% Providers: in network  SECONDARY: none        Medicaid Application Date:       Case Manager:  Disability Application Date:       Case Worker:   Emergency Contact Information Contact Information    Name Relation Home Work Mobile   [place] Sister   [phone]   Cardell Peach [phone]     Simpson,Dr. Louis Meckel   [phone]     Current Medical History  Patient Admitting Diagnosis:  Left AKA History of Present Illness:  HPI: Jodi Clarke-Campbellis a 66 y.o.right handed femalewho lives alone and was independent prior to admission .History of DVT /pulmonary emboli May 2018 placed on Xareltobut discontinued due to melena. Recently seen in the ER with shortness of breath cough left lower extremity swelling found to have right sided segmental and subsegmental emboli started on heparin therapy discontinued after finding anemia possible GI bleed she did receive a transfusion. During that workup CT abdomen and pelvis showed gastric/stomach cancer confirmed by EGD 08/30/2016 she did receive an IVC filter. A referral was made to outpatient oncology services for plans of  chemotherapy. On 09/08/2016 patient with increasing left leg pain swelling and rest pain. Noted necrotic changes left lower extremity and no Doppler signal. 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 Ruta Hinds. Hospital course pain management. Latest hemoglobin 6.6 and orders for transfusion for today. Hyperkalemia received Kayexalate.  Past Medical History  Past Medical History:  Diagnosis Date  . Anemia   . DVT (deep venous thrombosis) ([place]) 08/17/2016  . 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 GI bleed   . Hyperkalemia   . Hyponatremia   . PE (pulmonary thromboembolism) ([place]) 08/28/2016  . 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 Thrombocytopenia ([place])     Family History  family history includes Diabetes in her mother; Hypertension in her mother.  Prior Rehab/Hospitalizations:  Has the patient had major surgery during 100 days prior to admission? No  Current Medications   Current Facility-Administered Medications:  .  0.9 %  sodium chloride infusion, , Intravenous, Continuous, Elam Dutch, MD, Last Rate: 20 mL/hr at 09/20/16 1608 .  acetaminophen (TYLENOL) tablet 650 mg, 650 mg, Oral, Q6H PRN **  OR** acetaminophen (TYLENOL) suppository 650 mg, 650 mg, Rectal, Q6H PRN, Black, Karen M, NP .  feeding supplement (ENSURE ENLIVE) (ENSURE ENLIVE) liquid 237 mL, 237 mL, Oral, BID BM, Eugenie Filler, MD, 237 mL at 09/20/16 1610 .  HYDROcodone-acetaminophen (NORCO/VICODIN) 5-325 MG per tablet 1-2 tablet, 1-2 tablet, Oral, Q4H PRN, Radene Gunning, NP, 2 tablet at 09/20/16 1028 .  insulin aspart (novoLOG) injection 0-5 Units, 0-5 Units, Subcutaneous, QHS, Black, Karen M, NP .  insulin aspart (novoLOG) injection 0-9 Units, 0-9 Units, Subcutaneous, TID WC, Black, Lezlie Octave, NP, 1 Units at 09/19/16 1733 .  pneumococcal 23 valent vaccine (PNU-IMMUNE) injection 0.5 mL, 0.5 mL, Intramuscular, Prior to discharge, Eugenie Filler, MD .  senna-docusate (Senokot-S)  tablet 1 tablet, 1 tablet, Oral, QHS PRN, Black, Karen M, NP .  sodium chloride flush (NS) 0.9 % injection 3 mL, 3 mL, Intravenous, Q12H, Black, Karen M, NP, 3 mL at 09/21/16 1013 .  zolpidem (AMBIEN) tablet 5 mg, 5 mg, Oral, QHS PRN, Black, Lezlie Octave, NP  Patients Current Diet: Diet Carb Modified Fluid consistency: Thin; Room service appropriate? Yes  Precautions / Restrictions Precautions Precautions: Fall Restrictions Weight Bearing Restrictions: No LLE Weight Bearing: Non weight bearing   Has the patient had 2 or more falls or a fall with injury in the past year?No  Prior Activity Level Community (5-7x/wk): Independent and driving pta, Advertising account planner / [place] Devices/Equipment: [place]: None  Prior Device Use: Indicate devices/aids used by the patient prior to current illness, exacerbation or injury? None of the above  Prior Functional Level Prior Function Level of Independence: Independent Comments: works as a Chiropodist Care: Did the patient need help bathing, dressing, using the toilet or eating?  Independent  Indoor Mobility: Did the patient need assistance with walking from room to room (with or without device)? Independent  Stairs: Did the patient need assistance with internal or external stairs (with or without device)? Independent  Functional Cognition: Did the patient need help planning regular tasks such as shopping or remembering to take medications? Independent  Current Functional Level Cognition  Overall Cognitive Status: Within Functional Limits for tasks assessed Orientation Level: Oriented X4    Extremity Assessment (includes Sensation/Coordination)  Upper Extremity Assessment: Defer to OT evaluation  Lower Extremity Assessment: LLE deficits/detail LLE Deficits / Details: AKA. Strength limited by pain    ADLs  Overall ADL's : Needs assistance/impaired Eating/Feeding:  Independent, Sitting Grooming: Sitting, Supervision/safety, Set up Upper Body Bathing: Supervision/ safety, Sitting Lower Body Bathing: Maximal assistance, Sitting/lateral leans Upper Body Dressing : Supervision/safety, Sitting Lower Body Dressing: Maximal assistance, Sitting/lateral leans Toilet Transfer: +2 for physical assistance, Minimal assistance, RW, BSC, Stand-pivot Toileting- Clothing Manipulation and Hygiene: Maximal assistance, Sitting/lateral lean    Mobility  Overal bed mobility: Needs Assistance Bed Mobility: Supine to Sit, Sit to Supine Supine to sit: Min guard (Min guard for safety; VCs for sequence and hand/foot placement ) Sit to supine: Min guard General bed mobility comments: Min guard for safety; VCs for sequence and hand/foot placement and pushing up from bed to raise trunk     Transfers  Overall transfer level: Needs assistance Equipment used: Rolling walker (2 wheeled) Transfers: Sit to/from Stand, [employer] Transfers Sit to Stand: +2 physical assistance, Mod assist (1st transfer was mod +1; 2nd transfer mod +2; 3rd transfer min +2) Stand pivot transfers: +2 physical assistance, Min assist General transfer comment: Sit to stand x3;  VCs for hand placement and technique using RW. Min A to rise and steady.     Ambulation / Gait / Stairs / Office manager / Balance Balance Overall balance assessment: Needs assistance Sitting-balance support: No upper extremity supported, Feet unsupported Sitting balance-Leahy Scale: Fair Standing balance support: Bilateral upper extremity supported, During functional activity Standing balance-Leahy Scale: Poor Standing balance comment: walker and min A for static standing    Special needs/care consideration BiPAP/CPAP  N/a CPM  N/a Continuous Drip IV  N/a Dialysis  N/a      Life Vest  N/a Oxygen  N/a Special Bed  N/a Trach Size  N/a Wound Vac n/a Skin abrasion right buttocks noted 09/11/16; surgical  incision Bowel mgmt: continent LBM 09/21/16 Bladder mgmt: continent Diabetic mgmt  N/a   Previous Home Environment Living Arrangements: Alone  Lives With: Alone Available Help at Discharge:  (sister form PA, may take some FMLA to asisst) Type of Home: House Home Layout: Two level, Able to live on main level with bedroom/bathroom Home Access: Stairs to enter Entrance Stairs-Rails: None Entrance Stairs-Number of Steps: 2 Bathroom Shower/Tub: Multimedia programmer: Standard Bathroom Accessibility: Yes How Accessible: Accessible via walker [place]: No  Discharge Living Setting Plans for Discharge Living Setting: Patient's home, House Type of Home at Discharge: House Discharge Home Layout: Two level, Able to live on main level with bedroom/bathroom Discharge Home Access: Stairs to enter Entrance Stairs-Rails: None Entrance Stairs-Number of Steps: 2 Discharge Bathroom Shower/Tub: Walk-in shower Discharge Bathroom Toilet: Standard Discharge Bathroom Accessibility: Yes How Accessible: Accessible via walker Does the patient have any problems obtaining your medications?: No  Social/Family/Support Systems Patient Roles:  (legally seperated; employee) Contact Information: Tula Nakayama, friend Anticipated Caregiver: sister form [place] may take some FMLA to assist Anticipated Caregiver's Contact Information: see above Ability/Limitations of Caregiver: sister would have to take FMLA Caregiver Availability: Intermittent Discharge Plan Discussed with Primary Caregiver: No Is Caregiver In Agreement with Plan?: No Does Caregiver/Family have Issues with Lodging/Transportation while Pt is in Rehab?: No  Goals/Additional Needs Patient/Family Goal for Rehab: Mod I with PT and OT Expected length of stay: [facility] 7-10 days Additional Information: recent new diagnosis of cancer/gastric Pt/Family Agrees to Admission and willing to participate: Yes Program Orientation Provided &  Reviewed with Pt/Caregiver Including Roles  & Responsibilities: Yes  Decrease burden of Care through IP rehab admission: n/a  Possible need for SNF placement upon discharge:not anticipated  Patient Condition: This patient's medical and functional status has changed since the consult dated: 09/18/16 in which the Rehabilitation Physician determined and documented that the patient's condition is appropriate for intensive rehabilitative care in an inpatient rehabilitation facility. See "History of Present Illness" (above) for medical update. Functional changes are: mod assist transfers. Patient's medical and functional status update has been discussed with the Rehabilitation physician and patient remains appropriate for inpatient rehabilitation. Will admit to inpatient rehab today.  Preadmission Screen Completed By:  Cleatrice Burke, 09/21/2016 10:20 AM ______________________________________________________________________   Discussed status with Dr. Naaman Plummer on 09/21/2016 at  60 and received telephone approval for admission today.  Admission Coordinator:  Cleatrice Burke, time 2683 Date 09/21/2016

## 2016-09-20 NOTE — Clinical Social Work Note (Signed)
Clinical Social Work Assessment  Patient Details  Name: Jodi Collins MRN: [id number] Date of Birth: January 23, 1951  Date of referral:  09/20/16               Reason for consult:  Facility Placement                Permission sought to share information with:  Facility Art therapist granted to share information::  Yes, Verbal Permission Granted  Name::        Agency::  SNF  Relationship::     Contact Information:     Housing/Transportation Living arrangements for the past 2 months:  Single Family Home Source of Information:  Patient Patient Interpreter Needed:  None Criminal Activity/Legal Involvement Pertinent to Current Situation/Hospitalization:  No - Comment as needed Significant Relationships:  Siblings Lives with:  Self Do you feel safe going back to the place where you live?  No Need for family participation in patient care:  No (Coment)  Care giving concerns:  Pt lives at home alone and now with increase in impairment after AKA- does not feel as if she could manage at home without support at this time.   Social Worker assessment / plan:  CSW spoke with pt about CIR vs SNF- explained SNF would be alternative option for CIR if insurance does not approve CIR stay.  Employment status:  Therapist, music:  Managed Care PT Recommendations:  Inpatient Rehab Consult Information / Referral to community resources:  [place]  Patient/Family's Response to care:  Pt realistic about inability to go home at this time and agreeable to look at SNF options if CIR cannot admit.  Patient/Family's Understanding of and Emotional Response to Diagnosis, Current Treatment, and Prognosis:  Pt has no questions or concerns about current medical plan- pt is very motivated to regain mobility and independence.  Emotional Assessment Appearance:  Appears stated age Attitude/Demeanor/Rapport:    Affect (typically observed):  Appropriate,  Accepting Orientation:  Oriented to Self, Oriented to Place, Oriented to  Time, Oriented to Situation Alcohol / Substance use:  Not Applicable Psych involvement (Current and /or in the community):  No (Comment)  Discharge Needs  Concerns to be addressed:  Care Coordination Readmission within the last 30 days:  Yes Current discharge risk:  Physical Impairment Barriers to Discharge:  Continued Medical Work up   Jorge Ny, LCSW 09/20/2016, 10:49 AM

## 2016-09-20 NOTE — H&P (Signed)
Physical Medicine and Rehabilitation Admission H&P    Chief complaint: Stump pain  HPI: Jodi Collins is a 66 y.o. right handed female who lives alone and was independent prior to admission with close his family in [place]. 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 Recently seen in the ER with shortness of breath cough left lower extremity swelling found to have right sided segmental and subsegmental emboli started on heparin therapy discontinued after finding anemia possible GI bleed she did receive a transfusion. During that workup CT abdomen and pelvis showed gastric/stomach cancer confirmed by EGD 08/30/2016 she did receive an IVC filter. A referral was made to outpatient oncology services for plans of chemotherapy. On 09/08/2016 patient with increasing left leg pain swelling and rest pain. Noted necrotic changes left lower extremity and no Doppler signal. 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 Ruta Hinds. Hospital course pain management. Latest hemoglobin 6.6 and orders for transfusion. Hyperkalemia received [place]ex[place]. Physical and occupational therapy evaluations completed with recommendations of physical medicine rehabilitation consult. Patient was admitted for a comprehensive rehabilitation program  Review of Systems  Constitutional: Negative for chills.  HENT: Negative for hearing loss.   Eyes: Negative for blurred vision and double vision.  Respiratory: Negative for cough and shortness of breath.   Cardiovascular: Positive for leg swelling. Negative for chest pain and palpitations.  Gastrointestinal: Positive for melena. Negative for nausea and vomiting.  Genitourinary: Negative for dysuria, flank pain and hematuria.  Musculoskeletal: Positive for myalgias.  Skin: Negative for rash.  Neurological: Negative for seizures and weakness.  All other systems reviewed and are  negative.  Past Medical History:  Diagnosis Date  . Anemia   . DVT (deep venous thrombosis) ([place]) 08/17/2016  . 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 GI bleed   . Hyperkalemia   . Hyponatremia   . PE (pulmonary thromboembolism) ([place]) 08/28/2016  . 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 Thrombocytopenia ([place])    Past Surgical History:  Procedure Laterality Date  . AMPUTATION Left 09/18/2016   Procedure: AMPUTATION ABOVE KNEE-LEFT;  Surgeon: Elam Dutch, MD;  Location: [facility] OR;  Service: Vascular;  Laterality: Left;  . CERVICAL CONIZATION W/BX  2012   high grade squamous intraepithelial dysplasia.  Dr Garwin Brothers.   . COLONOSCOPY W/ POLYPECTOMY  10/2006 and 10/2011   Dr Collene Mares.  had adenomatous polyps.    . ESOPHAGOGASTRODUODENOSCOPY N/A 08/30/2016   Procedure: ESOPHAGOGASTRODUODENOSCOPY (EGD);  Surgeon: Carol Ada, MD;  Location: [facility];  Service: Gastroenterology;  Laterality: N/A;  . IR FLUORO GUIDE PORT INSERTION RIGHT  09/01/2016  . IR IVC FILTER PLMT / S&I /IMG GUID/MOD SED  08/29/2016  . IR US GUIDE VASC ACCESS RIGHT  09/01/2016   Family History  Problem Relation Age of Onset  . Hypertension Mother   . Diabetes Mother    Social History:  reports that she has never smoked. She has never used smokeless tobacco. She reports that she drinks alcohol. She reports that she does not use drugs. Allergies:  Allergies  Allergen Reactions  . No Known Allergies    Medications Prior to Admission  Medication Sig Dispense Refill  . oxyCODONE-acetaminophen (PERCOCET/ROXICET) 5-325 MG tablet Take 1-2 tablets by mouth every 4 (four) hours as needed for severe pain. 90 tablet 0  . polyethylene glycol powder (GLYCOLAX/MIRALAX) powder 1 capful  daily as needed (Patient taking differently: Take 17 g by mouth daily. ) 255 g 2  . dexamethasone (DECADRON) 4 MG tablet Take 2 tablets (8 mg total) by mouth daily. Start the day after chemotherapy for 2 days. Take with  food. 30 tablet 1  . fluorouracil CALGB 41638 in sodium chloride 0.9 % 150 mL Inject into the vein over 96 hr. Every 2 weeks, wears pump for 46 hours    . leucovorin in dextrose 5 % 250 mL Inject into the vein once. Every 2 weeks    . lidocaine-prilocaine (EMLA) cream Apply to affected area once 30 g 3  . Misc. Devices MISC Wheelchair 1 each 0  . ondansetron (ZOFRAN) 8 MG tablet Take 1 tablet (8 mg total) by mouth 2 (two) times daily as needed for refractory nausea / vomiting. Start on day 3 after chemotherapy. 30 tablet 1  . OXALIPLATIN IV Inject into the vein. Every 2 weeks    . prochlorperazine (COMPAZINE) 10 MG tablet Take 1 tablet (10 mg total) by mouth every 6 (six) hours as needed (Nausea or vomiting). 30 tablet 1    Home: Home Living Family/patient expects to be discharged to:: Skilled nursing facility ("probably going to rehab before going home") Living Arrangements: Alone Available Help at Discharge: Neighbor, Available PRN/intermittently (family is here from [place] temporarily) Type of Home: House Home Access: Stairs to enter Technical brewer of Steps: 2 Entrance Stairs-Rails: None Home Layout: Two level, Able to live on main level with bedroom/bathroom Bathroom Shower/Tub: Chiropodist: Standard Home Equipment: None   Functional History: Prior Function Level of Independence: Independent Comments: works as a Equities trader Status:  Mobility: Bed Mobility Overal bed mobility: Needs Assistance Bed Mobility: Supine to Sit Supine to sit: +2 for physical assistance, Min assist General bed mobility comments: cues for technique, use of pad to assist hips to EOB and to raise trunk Transfers Overall transfer level: Needs assistance Equipment used: Rolling walker (2 wheeled) Transfers: Sit to/from Stand, [employer] Transfers Sit to Stand: +2 physical assistance, Min assist Stand pivot transfers: +2 physical assistance, Min assist General  transfer comment: cues for hand placement and technique, assist to rise and steady as pt pivoted toward R to chair. Pt used walker for pivot from bed to chair. Pt initially pivoting on rt foot and then able to take a few backward hops to the chair.      ADL: ADL Overall ADL's : Needs assistance/impaired Eating/Feeding: Independent, Sitting Grooming: Sitting, Supervision/safety, Set up Upper Body Bathing: Supervision/ safety, Sitting Lower Body Bathing: Maximal assistance, Sitting/lateral leans Upper Body Dressing : Supervision/safety, Sitting Lower Body Dressing: Maximal assistance, Sitting/lateral leans Toilet Transfer: +2 for physical assistance, Minimal assistance, RW, BSC, Stand-pivot Toileting- Clothing Manipulation and Hygiene: Maximal assistance, Sitting/lateral lean  Cognition: Cognition Overall Cognitive Status: Within Functional Limits for tasks assessed Orientation Level: Oriented X4 Cognition Arousal/Alertness: Awake/alert Behavior During Therapy: WFL for tasks assessed/performed Overall Cognitive Status: Within Functional Limits for tasks assessed  Physical Exam: Blood pressure 96/61, pulse 99, temperature 97.6 F (36.4 C), temperature source Oral, resp. rate 18, height 5' 2"  (1.575 m), weight 63.5 kg (140 lb), SpO2 96 %. Physical Exam  Vitals reviewed. Constitutional: She appears well-developed.  HENT:  Head: Normocephalic.  Eyes: EOM are normal.  Neck: Normal range of motion. Neck supple. No thyromegaly present.  Cardiovascular: Normal rate and regular rhythm.  Exam reveals no friction rub.   No murmur heard. Respiratory: Effort normal and breath  sounds normal. No respiratory distress. She has no wheezes. She has no rales.  GI: Soft. Bowel sounds are normal. She exhibits no distension. There is no tenderness. There is no rebound.  Psychiatric:  Pleasant and cooperative.   Skin. Amputation site dressed appropriately tender/ACE in place. Skin of RLE intact. Some  bruising from blood draw sites Neurological: She is alert and oriented to person, place, and time.  Motor: B/L UE 5/5 proximal to distal RLE: 4+/5 proximal to distal LLE: 2+5 HF, distal N/A Sensation intact to light touch in all 4's. Cognitively intact.     Results for orders placed or performed during the hospital encounter of 09/18/16 (from the past 48 hour(s))  I-STAT 4, (NA,K, GLUC, HGB,HCT)     Status: Abnormal   Collection Time: 09/18/16  3:09 PM  Result Value Ref Range   Sodium 133 (L) 135 - 145 mmol/L   Potassium 5.7 (H) 3.5 - 5.1 mmol/L   Glucose, Bld 113 (H) 65 - 99 mg/dL   HCT 20.0 (L) 36.0 - 46.0 %   Hemoglobin 6.8 (LL) 12.0 - 15.0 g/dL   Comment MD NOTIFIED, REPEAT TEST   Glucose, capillary     Status: Abnormal   Collection Time: 09/18/16  9:13 PM  Result Value Ref Range   Glucose-Capillary 130 (H) 65 - 99 mg/dL   Comment 1 Notify RN   HIV antibody (Routine Testing)     Status: None   Collection Time: 09/18/16  9:49 PM  Result Value Ref Range   HIV Screen 4th Generation wRfx Non Reactive Non Reactive    Comment: (NOTE) Performed At: [facility] [place], [place] [id number] Lindon Romp MD [id number]   CBC     Status: Abnormal   Collection Time: 09/18/16  9:49 PM  Result Value Ref Range   WBC 23.6 (H) 4.0 - 10.5 K/uL   RBC 3.00 (L) 3.87 - 5.11 MIL/uL   Hemoglobin 8.5 (L) 12.0 - 15.0 g/dL    Comment: REPEATED TO VERIFY POST TRANSFUSION SPECIMEN    HCT 25.9 (L) 36.0 - 46.0 %   MCV 86.3 78.0 - 100.0 fL   MCH 28.3 26.0 - 34.0 pg   MCHC 32.8 30.0 - 36.0 g/dL   RDW 15.9 (H) 11.5 - 15.5 %   Platelets 64 (L) 150 - 400 K/uL    Comment: REPEATED TO VERIFY SPECIMEN CHECKED FOR CLOTS PLATELET COUNT CONFIRMED BY SMEAR   Basic metabolic panel     Status: Abnormal   Collection Time: 09/18/16  9:49 PM  Result Value Ref Range   Sodium 132 (L) 135 - 145 mmol/L   Potassium 5.4 (H) 3.5 - 5.1 mmol/L   Chloride 105 101 - 111 mmol/L   CO2 19  (L) 22 - 32 mmol/L   Glucose, Bld 127 (H) 65 - 99 mg/dL   BUN 41 (H) 6 - 20 mg/dL   Creatinine, Ser 1.35 (H) 0.44 - 1.00 mg/dL   Calcium 8.1 (L) 8.9 - 10.3 mg/dL   GFR calc non Af Amer 41 (L) >60 mL/min   GFR calc Af Amer 47 (L) >60 mL/min    Comment: (NOTE) The eGFR has been calculated using the CKD EPI equation. This calculation has not been validated in all clinical situations. eGFR's persistently <60 mL/min signify possible Chronic Kidney Disease.    Anion gap 8 5 - 15  Hemoglobin A1c     Status: None   Collection Time: 09/18/16  9:49 PM  Result  Value Ref Range   Hgb A1c MFr Bld 5.1 4.8 - 5.6 %    Comment: (NOTE)         Pre-diabetes: 5.7 - 6.4         Diabetes: >6.4         Glycemic control for adults with diabetes: <7.0    Mean Plasma Glucose 100 mg/dL    Comment: (NOTE) Performed At: [facility] [place], [place] [id number] Lindon Romp MD [id number]   Comprehensive metabolic panel     Status: Abnormal   Collection Time: 09/19/16  2:52 AM  Result Value Ref Range   Sodium 133 (L) 135 - 145 mmol/L   Potassium 5.4 (H) 3.5 - 5.1 mmol/L    Comment: SLIGHT HEMOLYSIS   Chloride 107 101 - 111 mmol/L   CO2 19 (L) 22 - 32 mmol/L   Glucose, Bld 105 (H) 65 - 99 mg/dL   BUN 43 (H) 6 - 20 mg/dL   Creatinine, Ser 1.29 (H) 0.44 - 1.00 mg/dL   Calcium 8.0 (L) 8.9 - 10.3 mg/dL   Total Protein 5.2 (L) 6.5 - 8.1 g/dL   Albumin 1.9 (L) 3.5 - 5.0 g/dL   AST 40 15 - 41 U/L   ALT 20 14 - 54 U/L   Alkaline Phosphatase 82 38 - 126 U/L   Total Bilirubin 1.3 (H) 0.3 - 1.2 mg/dL   GFR calc non Af Amer 43 (L) >60 mL/min   GFR calc Af Amer 50 (L) >60 mL/min    Comment: (NOTE) The eGFR has been calculated using the CKD EPI equation. This calculation has not been validated in all clinical situations. eGFR's persistently <60 mL/min signify possible Chronic Kidney Disease.    Anion gap 7 5 - 15  CBC     Status: Abnormal   Collection Time: 09/19/16  2:52 AM    Result Value Ref Range   WBC 20.8 (H) 4.0 - 10.5 K/uL   RBC 2.76 (L) 3.87 - 5.11 MIL/uL   Hemoglobin 7.8 (L) 12.0 - 15.0 g/dL   HCT 23.6 (L) 36.0 - 46.0 %   MCV 85.5 78.0 - 100.0 fL   MCH 28.3 26.0 - 34.0 pg   MCHC 33.1 30.0 - 36.0 g/dL   RDW 16.4 (H) 11.5 - 15.5 %   Platelets 64 (L) 150 - 400 K/uL    Comment: REPEATED TO VERIFY SPECIMEN CHECKED FOR CLOTS PLATELET COUNT CONFIRMED BY SMEAR   Glucose, capillary     Status: Abnormal   Collection Time: 09/19/16  6:14 AM  Result Value Ref Range   Glucose-Capillary 125 (H) 65 - 99 mg/dL   Comment 1 Notify RN   Potassium     Status: Abnormal   Collection Time: 09/19/16  9:37 AM  Result Value Ref Range   Potassium 5.4 (H) 3.5 - 5.1 mmol/L  Culture, blood (Routine X 2) w Reflex to ID Panel     Status: None (Preliminary result)   Collection Time: 09/19/16  9:37 AM  Result Value Ref Range   Specimen Description BLOOD LEFT ARM    Special Requests IN PEDIATRIC BOTTLE Blood Culture adequate volume    Culture NO GROWTH < 24 HOURS    Report Status PENDING   Culture, blood (Routine X 2) w Reflex to ID Panel     Status: None (Preliminary result)   Collection Time: 09/19/16  9:37 AM  Result Value Ref Range   Specimen Description BLOOD RIGHT HAND  Special Requests IN PEDIATRIC BOTTLE Blood Culture adequate volume    Culture NO GROWTH < 24 HOURS    Report Status PENDING   Vitamin B12     Status: Abnormal   Collection Time: 09/19/16  9:37 AM  Result Value Ref Range   Vitamin B-12 3,160 (H) 180 - 914 pg/mL    Comment: (NOTE) This assay is not validated for testing neonatal or myeloproliferative syndrome specimens for Vitamin B12 levels.   Folate     Status: None   Collection Time: 09/19/16  9:37 AM  Result Value Ref Range   Folate 8.1 >5.9 ng/mL  Iron and TIBC     Status: Abnormal   Collection Time: 09/19/16  9:37 AM  Result Value Ref Range   Iron 18 (L) 28 - 170 ug/dL   TIBC 193 (L) 250 - 450 ug/dL   Saturation Ratios 9 (L) 10.4 -  31.8 %   UIBC 175 ug/dL  Ferritin     Status: None   Collection Time: 09/19/16  9:37 AM  Result Value Ref Range   Ferritin 261 11 - 307 ng/mL  Urinalysis, Routine w reflex microscopic     Status: None   Collection Time: 09/19/16 10:45 AM  Result Value Ref Range   Color, Urine YELLOW YELLOW   APPearance CLEAR CLEAR   Specific Gravity, Urine 1.025 1.005 - 1.030   pH 5.0 5.0 - 8.0   Glucose, UA NEGATIVE NEGATIVE mg/dL   Hgb urine dipstick NEGATIVE NEGATIVE   Bilirubin Urine NEGATIVE NEGATIVE   Ketones, ur NEGATIVE NEGATIVE mg/dL   Protein, ur NEGATIVE NEGATIVE mg/dL   Nitrite NEGATIVE NEGATIVE   Leukocytes, UA NEGATIVE NEGATIVE  Urine Culture     Status: Abnormal   Collection Time: 09/19/16 10:45 AM  Result Value Ref Range   Specimen Description URINE, CLEAN CATCH    Special Requests NONE    Culture MULTIPLE SPECIES PRESENT, SUGGEST RECOLLECTION (A)    Report Status 09/20/2016 FINAL   Glucose, capillary     Status: Abnormal   Collection Time: 09/19/16 11:15 AM  Result Value Ref Range   Glucose-Capillary 109 (H) 65 - 99 mg/dL   Comment 1 Notify RN   Glucose, capillary     Status: Abnormal   Collection Time: 09/19/16  4:29 PM  Result Value Ref Range   Glucose-Capillary 126 (H) 65 - 99 mg/dL   Comment 1 Notify RN   Glucose, capillary     Status: Abnormal   Collection Time: 09/19/16  9:36 PM  Result Value Ref Range   Glucose-Capillary 111 (H) 65 - 99 mg/dL  Basic metabolic panel     Status: Abnormal   Collection Time: 09/20/16  2:43 AM  Result Value Ref Range   Sodium 134 (L) 135 - 145 mmol/L   Potassium 3.0 (L) 3.5 - 5.1 mmol/L    Comment: DELTA CHECK NOTED   Chloride 106 101 - 111 mmol/L   CO2 18 (L) 22 - 32 mmol/L   Glucose, Bld 101 (H) 65 - 99 mg/dL   BUN 37 (H) 6 - 20 mg/dL   Creatinine, Ser 0.96 0.44 - 1.00 mg/dL   Calcium 7.9 (L) 8.9 - 10.3 mg/dL   GFR calc non Af Amer >60 >60 mL/min   GFR calc Af Amer >60 >60 mL/min    Comment: (NOTE) The eGFR has been  calculated using the CKD EPI equation. This calculation has not been validated in all clinical situations. eGFR's persistently <60 mL/min signify possible Chronic  Kidney Disease.    Anion gap 10 5 - 15  CBC     Status: Abnormal   Collection Time: 09/20/16  2:43 AM  Result Value Ref Range   WBC 18.1 (H) 4.0 - 10.5 K/uL   RBC 2.54 (L) 3.87 - 5.11 MIL/uL   Hemoglobin 7.1 (L) 12.0 - 15.0 g/dL   HCT 22.3 (L) 36.0 - 46.0 %   MCV 87.8 78.0 - 100.0 fL   MCH 28.0 26.0 - 34.0 pg   MCHC 31.8 30.0 - 36.0 g/dL   RDW 17.5 (H) 11.5 - 15.5 %   Platelets 63 (L) 150 - 400 K/uL    Comment: PLATELET COUNT CONFIRMED BY SMEAR CONSISTENT WITH PREVIOUS RESULT   Glucose, capillary     Status: Abnormal   Collection Time: 09/20/16  6:27 AM  Result Value Ref Range   Glucose-Capillary 116 (H) 65 - 99 mg/dL  Glucose, capillary     Status: Abnormal   Collection Time: 09/20/16 11:22 AM  Result Value Ref Range   Glucose-Capillary 107 (H) 65 - 99 mg/dL   Comment 1 Notify RN    Comment 2 Document in Chart    Dg Chest Port 1 View  Result Date: 09/18/2016 CLINICAL DATA:  Postoperative radiograph, for generalized chest pain, acute onset. Initial encounter. 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 The lungs are otherwise grossly clear. No pleural effusion or pneumothorax is seen. The cardiomediastinal silhouette is normal in size. A right-sided chest port is noted ending about the mid SVC. No acute osseous abnormalities are identified. IMPRESSION: Large left-sided diaphragmatic hernia again noted, with associated atelectasis. Lungs otherwise grossly clear. Electronically Signed   By: Garald Balding M.D.   On: 09/18/2016 21:41       Medical Problem List and Plan: 1.  Decreased functional mobility secondary to left AKA 09/18/2016  -admit to inpatient rehab 2.  DVT  Prophylaxis/Anticoagulation: SCD RLE. 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 Patient does have an IVC filter. 3. Pain Management: Hydrocodone as needed  -stump protection/edema control 4. Mood: Provide emotional support 5. Neuropsych: This patient is capable of making decisions on her own behalf. 6. Skin/Wound Care: Routine skin checks 7. Fluids/Electrolytes/Nutrition: Routine I&O with follow-up chemistries 8. Acute blood loss anemia. Follow-up CBC  -s/p transfusion 2prbc  -iron supplementation 9. Hyperkalemia. Resolved. Follow-up chemistries upon admit 10. History of gastric/stomach cancer. Follow-up outpatient Dr. Mila Merry    Post Admission Physician Evaluation: 1. Functional deficits secondary  to left AKA. 2. Patient is admitted to receive collaborative, interdisciplinary care between the physiatrist, rehab nursing staff, and therapy team. 3. Patient's level of medical complexity and substantial therapy needs in context of that medical necessity cannot be provided at a lesser intensity of care such as a SNF. 4. Patient has experienced substantial functional loss from his/her baseline which was documented above under the "Functional History" and "Functional Status" headings.  Judging by the patient's diagnosis, physical exam, and functional history, the patient has potential for functional progress which will result in measurable gains while on inpatient rehab.  These gains will be of substantial and practical use upon discharge  in facilitating mobility and self-care at the household level. 5. Physiatrist will provide 24 hour management of medical needs as well as oversight of the therapy plan/treatment and provide guidance as appropriate regarding the interaction of the  two. 6. The Preadmission Screening has been reviewed and patient status is unchanged unless otherwise stated above. 7. 24 hour rehab nursing will assist with bladder management,  bowel management, safety, skin/wound care, disease management, medication administration, pain management and patient education  and help integrate therapy concepts, techniques,education, etc. 8. PT will assess and treat for/with: Lower extremity strength, range of motion, stamina, balance, functional mobility, safety, adaptive techniques and equipment, pain mgt, pre-prosthetic education.   Goals are: mod I. 9. OT will assess and treat for/with: ADL's, functional mobility, safety, upper extremity strength, adaptive techniques and equipment, pain mgt, ego support, community rreintegration.   Goals are: mod I. Therapy may proceed with showering this patient. 10. SLP will assess and treat for/with: n/a.  Goals are: n/a. 11. Case Management and Social Worker will assess and treat for psychological issues and discharge planning. 12. Team conference will be held weekly to assess progress toward goals and to determine barriers to discharge. 13. Patient will receive at least 3 hours of therapy per day at least 5 days per week. 14. ELOS: 7-10 days       15. Prognosis:  excellent     Meredith Staggers, MD, [facility] 09/21/2016  Cathlyn Parsons., PA-C 09/20/2016

## 2016-09-20 NOTE — Progress Notes (Signed)
Assumed care from off going RN @ 0730; patient alert & oriented; patient assisted to sit on side of bed while eating breakfast; patient denies pain; no bleeding at surgical site; bed in lowest position with call bell in reach

## 2016-09-20 NOTE — Progress Notes (Addendum)
Initial Nutrition Assessment  DOCUMENTATION CODES:   Not applicable  INTERVENTION:  Continue Ensure Enlive po BID, each supplement provides 350 kcal and 20 grams of protein.  Encourage adequate PO intake.   NUTRITION DIAGNOSIS:   Increased nutrient needs related to wound healing as evidenced by estimated needs.  GOAL:   Patient will meet greater than or equal to 90% of their needs  MONITOR:   PO intake, Supplement acceptance, Labs, Weight trends, Skin, I & O's  REASON FOR ASSESSMENT:   Malnutrition Screening Tool    ASSESSMENT:   66 year old female history of left lower extremity DVT/PE, GI bleed from gastric cancer. Patient underwent left AKA per Dr.Fields after patient developed necrotic toes and IP dermal lysis of the forefoot and foot.  Procedure (7/2): Left above knee amputation  Pt was unavailable during attempted time of visit. Meal completion has been varied from 25-100%. Pt with no weight loss per weight records. Pt currently has Ensure ordered. RD to continue with current orders to aid in caloric and protein needs as well as in wound healing.  Unable to complete Nutrition-Focused physical exam at this time.   Labs and medications reviewed.   Diet Order:  Diet Carb Modified Fluid consistency: Thin; Room service appropriate? Yes  Skin:   (Incision L leg)  Last BM:  7/4  Height:   Ht Readings from Last 1 Encounters:  09/18/16 5\' 2"  (1.575 m)    Weight:   Wt Readings from Last 1 Encounters:  09/18/16 140 lb (63.5 kg)    Ideal Body Weight:  46 kg (adjusted for L AKA)  BMI:  Body mass index is 25.61 kg/m.  Estimated Nutritional Needs:   Kcal:  8916-9450  Protein:  80-90 grams  Fluid:  1.7 - 1.9 L/day  EDUCATION NEEDS:   No education needs identified at this time  Corrin Parker, MS, RD, LDN Pager # [phone] After hours/ weekend pager # [phone]

## 2016-09-20 NOTE — Progress Notes (Signed)
I met with pt at bedside to discuss a possible inpt rehab admit. She prefers CIR rather than SNF. I have begun insurance authorization yesterday and will follow up tomorrow. Admission depends on approval and bed availability. [phone]

## 2016-09-20 NOTE — Progress Notes (Signed)
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 PMH: L LE DVT/pulmonary emboli, GIB, anemia, recent dx of gastric cancer.    PT Comments    Upon arrival pt is in chair with nurse in room. Pt states she has been in the recliner chair for several hours and agrees to get up to complete exercises and transfer to and from the bed. Pt requires VCs for hand placement and sequencing for sit to stand transfer to RW. Pt able to complete standing and sidelying hip extension exercises with no pain and minimal fatigue. Patient seems to be limited with sit to stand transfers d/t fear; however she was able to improve transfers with less assist by the end of the session. Pt would benefit from continued acute therapy for strengthening and gait training with AD for improved mobilization.    Follow Up Recommendations  CIR     Equipment Recommendations  Rolling walker with 5" wheels;Wheelchair (measurements PT);Wheelchair cushion (measurements PT)    Recommendations for Other Services       Precautions / Restrictions Precautions Precautions: Fall Restrictions Weight Bearing Restrictions: No    Mobility  Bed Mobility Overal bed mobility: Needs Assistance Bed Mobility: Supine to Sit;Sit to Supine     Supine to sit: Min guard (Min guard for safety; VCs for sequence and hand/foot placement ) Sit to supine: Min guard   General bed mobility comments: Min guard for safety; VCs for sequence and hand/foot placement and pushing up from bed to raise trunk   Transfers Overall transfer level: Needs assistance   Transfers: Sit to/from Stand;Stand Pivot Transfers Sit to Stand: +2 physical assistance;Mod assist (1st transfer was mod +1; 2nd transfer mod +2; 3rd transfer min +2)         General transfer comment: Sit to stand x3; VCs  for hand placement and technique using RW. Min A to rise and steady.   Ambulation/Gait                 Stairs            Wheelchair Mobility    Modified Rankin (Stroke Patients Only)       Balance Overall balance assessment: Needs assistance Sitting-balance support: No upper extremity supported;Feet unsupported Sitting balance-Leahy Scale: Fair     Standing balance support: Bilateral upper extremity supported;During functional activity Standing balance-Leahy Scale: Poor Standing balance comment: walker and min A for static standing                            Cognition Arousal/Alertness: Awake/alert Behavior During Therapy: WFL for tasks assessed/performed Overall Cognitive Status: Within Functional Limits for tasks assessed                                        Exercises Amputee Exercises Hip Extension: AROM;Standing;Sidelying;15 reps (15 reps each on Lt LE)    General Comments        Pertinent Vitals/Pain Pain Assessment: No/denies pain    Home Living     Available Help at Discharge:  (sister form [place], may take some FMLA to asisst)                Prior Function  PT Goals (current goals can now be found in the care plan section) Acute Rehab PT Goals Patient Stated Goal: To get stronger  PT Goal Formulation: With patient Time For Goal Achievement: 10/04/16 Potential to Achieve Goals: Good Progress towards PT goals: Progressing toward goals    Frequency    Min 3X/week      PT Plan Current plan remains appropriate    Co-evaluation              AM-PAC PT "6 Clicks" Daily Activity  Outcome Measure  Difficulty turning over in bed (including adjusting bedclothes, sheets and blankets)?: Total Difficulty moving from lying on back to sitting on the side of the bed? : Total Difficulty sitting down on and standing up from a chair with arms (e.g., wheelchair, bedside commode, etc,.)?:  Total Help needed moving to and from a bed to chair (including a wheelchair)?: A Lot Help needed walking in hospital room?: Total Help needed climbing 3-5 steps with a railing? : Total 6 Click Score: 7    End of Session Equipment Utilized During Treatment: Gait belt Activity Tolerance: Patient tolerated treatment well Patient left: in chair;with call bell/phone within reach;with chair alarm set Nurse Communication: Mobility status (Need for dressing change and IV reconnection ) PT Visit Diagnosis: Other abnormalities of gait and mobility (R26.89);Unsteadiness on feet (R26.81)     Time: 5400-8676 PT Time Calculation (min) (ACUTE ONLY): 24 min  Charges:  $Therapeutic Exercise: 8-22 mins $Therapeutic Activity: 8-22 mins                    G Codes:       Elberta Leatherwood, SPT Acute Rehab [place] 09/20/2016, 5:25 PM

## 2016-09-20 NOTE — Progress Notes (Signed)
  Progress Note    09/20/2016 7:35 AM 2 Days Post-Op  Subjective:  Pt getting ready to eat breakfast-says she did not like having diarrhea all night from whatever medicine they gave her (Kayexelate).    Afebrile HR  90's-100's NSR/ST 35'T systolic 61% RA  Vitals:   09/19/16 1950 09/20/16 0514  BP: (!) 90/59 96/61  Pulse: (!) 102 99  Resp: 20 18  Temp: 97.9 F (36.6 C) 97.6 F (36.4 C)     CBC    Component Value Date/Time   WBC 18.1 (H) 09/20/2016 0243   RBC 2.54 (L) 09/20/2016 0243   HGB 7.1 (L) 09/20/2016 0243   HCT 22.3 (L) 09/20/2016 0243   PLT 63 (L) 09/20/2016 0243   MCV 87.8 09/20/2016 0243   MCH 28.0 09/20/2016 0243   MCHC 31.8 09/20/2016 0243   RDW 17.5 (H) 09/20/2016 0243   LYMPHSABS 1.2 09/11/2016 2017   MONOABS 1.0 09/11/2016 2017   EOSABS 0.0 09/11/2016 2017   BASOSABS 0.0 09/11/2016 2017    BMET    Component Value Date/Time   NA 134 (L) 09/20/2016 0243   K 3.0 (L) 09/20/2016 0243   CL 106 09/20/2016 0243   CO2 18 (L) 09/20/2016 0243   GLUCOSE 101 (H) 09/20/2016 0243   BUN 37 (H) 09/20/2016 0243   CREATININE 0.96 09/20/2016 0243   CALCIUM 7.9 (L) 09/20/2016 0243   GFRNONAA >60 09/20/2016 0243   GFRAA >60 09/20/2016 0243    INR    Component Value Date/Time   INR 1.54 09/18/2016 1035     Intake/Output Summary (Last 24 hours) at 09/20/16 0735 Last data filed at 09/20/16 0456  Gross per 24 hour  Intake              723 ml  Output              416 ml  Net              307 ml     Assessment/Plan:  66 y.o. female is s/p left above knee amputation  2 Days Post-Op  -will return to check on pt's AKA stump after she finishes breakfast -leukocytosis improving & pt is afebrile -acute surgical blood loss anemia-trending downward Hyperkalemia improved and now on the lower side (diarrhea from Kayexalate) -hx recent GIB-no Lovenox/Heparin (has IVC filter in place)   Leontine Locket, PA-C [facility]   [phone] 09/20/2016 7:35 AM

## 2016-09-20 NOTE — Progress Notes (Signed)
Patient ID: Jodi Collins, female   DOB: 1950/08/10, 66 y.o.   MRN: [id number]  PROGRESS NOTE    Jodi Collins  NWG:[id number] DOB: Feb 02, 1951 DOA: 09/18/2016 PCP: Glendale Chard, MD   Brief Narrative:  66 year old female with history of left lower extremity DVT/PE was on Xarelto which was subsequently discontinued secondary to a GI bleed from gastric cancer. Patient underwent left AKA by Dr.Fields after patient developed necrotic toes and epidermolysis of the forefoot and foot. Patient also noted to be anemic with a hemoglobin of 6.7. Patient also noted to be hyperkalemic. [facility] admitted patient due to her multiple comorbidities.   Assessment & Plan:   Principal Problem:   S/P AKA (above knee amputation) unilateral, left (HCC) Active Problems:   Anemia   Gastric cancer (HCC)   Left leg DVT (HCC)   Hyperkalemia   Hyponatremia   Acute kidney injury ([place])   GI bleed   Leukocytosis   Post-op pain   Acute blood loss anemia   Thrombocytopenia (HCC)   Stage 3 chronic kidney disease   Status post left AKA secondary to left foot ischemia Patient underwent left AKA 09/18/2016 by vascular surgery. - Wound care as per vascular surgery recommendations  acute blood loss anemia/iron deficiency anemia -Status post transfusion. Hemoglobin is 7.1 today. Check H&H for tomorrow. No need for transfusion today.  hyperkalemia Questionable etiology. Status post Kayexalate. Resolved. - Labs show hypokalemia today: Will replace potassium orally  acute kidney injury Likely secondary to prerenal azotemia. Improved  leukocytosis Likely secondary to left foot ischemia. Improving. Monitor off antibiotics. Repeat a.m. Labs  history of left lower extremity DVT and PE Status post IVC filter placement. Patient was on anticoagulation which was discontinued secondary to GI bleed secondary to gastric cancer.  hyponatremia Improving. Repeat a.m. labs  history of  gastric cancer Diagnosed recently. Upper endoscopy 08/30/2016 revealed a 4 cm facial hernia as well as a large fungating mass with no bleeding, no stigmata of recent bleeding noted at the GE junction. Biopsies taken. Outpatient follow-up with oncology. Prognosis is guarded to poor. - PT/OT evaluation. Patient might need nursing home versus inpatient rehabilitation.  Hypoalbuminemia - Follow dietary/nutrition recommendations     DVT prophylaxis: SCDs Code Status:  Full Family Communication: None present at bedside Disposition Plan: Nursing home versus inpatient rehabilitation in 1-2 days  Consultants: Vascular surgery   Procedures: Left AKA by Dr. Oneida Alar on 7-18  Antimicrobials: IV cefuroxime 09/18/2016 perioperatively   Subjective: Patient seen and examined at bedside. She denies any overnight fever, nausea, vomiting, chest pain.  Objective: Vitals:   09/18/16 2032 09/19/16 0551 09/19/16 1950 09/20/16 0514  BP: (!) 91/56 95/60 (!) 90/59 96/61  Pulse: 95 91 (!) 102 99  Resp: 18 18 20 18   Temp: 97.5 F (36.4 C) 97.7 F (36.5 C) 97.9 F (36.6 C) 97.6 F (36.4 C)  TempSrc: Oral Oral Oral Oral  SpO2: 95% 100% 96% 96%  Weight:      Height:        Intake/Output Summary (Last 24 hours) at 09/20/16 1326 Last data filed at 09/20/16 1230  Gross per 24 hour  Intake              843 ml  Output              216 ml  Net              627 ml   Filed Weights   09/18/16 1025  Weight: 63.5 kg (140  lb)    Examination:  General exam: Appears calm and comfortable  Respiratory system: Bilateral decreased breath sound at bases Cardiovascular system: S1 & S2 heard, Rate controlled  Gastrointestinal system: Abdomen is nondistended, soft and nontender. Normal bowel sounds heard. Extremities: No cyanosis, clubbing; 1-2+ right pitting pedal edema; left AKA    Data Reviewed: I have personally reviewed following labs and imaging studies  CBC:  Recent Labs Lab 09/18/16 1035  09/18/16 1509 09/18/16 2149 09/19/16 0252 09/20/16 0243  WBC 23.1*  --  23.6* 20.8* 18.1*  HGB 6.7* 6.8* 8.5* 7.8* 7.1*  HCT 21.9* 20.0* 25.9* 23.6* 22.3*  MCV 84.2  --  86.3 85.5 87.8  PLT 96*  --  64* 64* 63*   Basic Metabolic Panel:  Recent Labs Lab 09/18/16 1035 09/18/16 1509 09/18/16 2149 09/19/16 0252 09/19/16 0937 09/20/16 0243  NA 131* 133* 132* 133*  --  134*  K 6.0* 5.7* 5.4* 5.4* 5.4* 3.0*  CL 104  --  105 107  --  106  CO2 16*  --  19* 19*  --  18*  GLUCOSE 117* 113* 127* 105*  --  101*  BUN 41*  --  41* 43*  --  37*  CREATININE 1.35*  --  1.35* 1.29*  --  0.96  CALCIUM 8.6*  --  8.1* 8.0*  --  7.9*   GFR: Estimated Creatinine Clearance: 51.9 mL/min (by C-G formula based on SCr of 0.96 mg/dL). Liver Function Tests:  Recent Labs Lab 09/18/16 1035 09/19/16 0252  AST 36 40  ALT 30 20  ALKPHOS 111 82  BILITOT 0.5 1.3*  PROT 6.4* 5.2*  ALBUMIN 2.0* 1.9*   No results for input(s): LIPASE, AMYLASE in the last 168 hours. No results for input(s): AMMONIA in the last 168 hours. Coagulation Profile:  Recent Labs Lab 09/18/16 1035  INR 1.54   Cardiac Enzymes: No results for input(s): CKTOTAL, CKMB, CKMBINDEX, TROPONINI in the last 168 hours. BNP (last 3 results) No results for input(s): PROBNP in the last 8760 hours. HbA1C:  Recent Labs  09/18/16 2149  HGBA1C 5.1   CBG:  Recent Labs Lab 09/19/16 1115 09/19/16 1629 09/19/16 2136 09/20/16 0627 09/20/16 1122  GLUCAP 109* 126* 111* 116* 107*   Lipid Profile: No results for input(s): CHOL, HDL, LDLCALC, TRIG, CHOLHDL, LDLDIRECT in the last 72 hours. Thyroid Function Tests: No results for input(s): TSH, T4TOTAL, FREET4, T3FREE, THYROIDAB in the last 72 hours. Anemia Panel:  Recent Labs  09/19/16 0937  VITAMINB12 3,160*  FOLATE 8.1  FERRITIN 261  TIBC 193*  IRON 18*   Sepsis Labs: No results for input(s): PROCALCITON, LATICACIDVEN in the last 168 hours.  Recent Results (from the  past 240 hour(s))  Culture, blood (Routine X 2) w Reflex to ID Panel     Status: None (Preliminary result)   Collection Time: 09/19/16  9:37 AM  Result Value Ref Range Status   Specimen Description BLOOD LEFT ARM  Final   Special Requests IN PEDIATRIC BOTTLE Blood Culture adequate volume  Final   Culture NO GROWTH < 24 HOURS  Final   Report Status PENDING  Incomplete  Culture, blood (Routine X 2) w Reflex to ID Panel     Status: None (Preliminary result)   Collection Time: 09/19/16  9:37 AM  Result Value Ref Range Status   Specimen Description BLOOD RIGHT HAND  Final   Special Requests IN PEDIATRIC BOTTLE Blood Culture adequate volume  Final   Culture NO GROWTH <  24 HOURS  Final   Report Status PENDING  Incomplete  Urine Culture     Status: Abnormal   Collection Time: 09/19/16 10:45 AM  Result Value Ref Range Status   Specimen Description URINE, CLEAN CATCH  Final   Special Requests NONE  Final   Culture MULTIPLE SPECIES PRESENT, SUGGEST RECOLLECTION (A)  Final   Report Status 09/20/2016 FINAL  Final         Radiology Studies: Dg Chest Port 1 View  Result Date: 09/18/2016 CLINICAL DATA:  Postoperative radiograph, for generalized chest pain, acute onset. Initial encounter. 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 The lungs are otherwise grossly clear. No pleural effusion or pneumothorax is seen. The cardiomediastinal silhouette is normal in size. A right-sided chest port is noted ending about the mid SVC. No acute osseous abnormalities are identified. IMPRESSION: Large left-sided diaphragmatic hernia again noted, with associated atelectasis. Lungs otherwise grossly clear. Electronically Signed   By: Garald Balding M.D.   On: 09/18/2016 21:41        Scheduled Meds: . feeding supplement (ENSURE ENLIVE)  237 mL Oral BID BM  . insulin aspart  0-5 Units  Subcutaneous QHS  . insulin aspart  0-9 Units Subcutaneous TID WC  . potassium chloride  80 mEq Oral Once  . sodium chloride flush  3 mL Intravenous Q12H   Continuous Infusions: . sodium chloride       LOS: 2 days        Aline August, MD [facility] Pager [phone]  If 7PM-7AM, please contact night-coverage [email or web address] Password [email or web address] 09/20/2016, 1:26 PM

## 2016-09-20 NOTE — NC FL2 (Signed)
[place] MEDICAID FL2 LEVEL OF CARE SCREENING TOOL     IDENTIFICATION  Patient Name: Leeasia Secrist Birthdate: January 24, 1951 Sex: female Admission Date (Current Location): 09/18/2016  [facility] [place] Number:  [employer] and Address:  [facility], [place] [address], [place], [place] [address]      Provider Number: [id number]  Attending Physician Name and Address:  Aline August, MD  Relative Name and Phone Number:       Current Level of Care: Hospital Recommended Level of Care: Early Prior Approval Number:    Date Approved/Denied:   PASRR Number: [id number]  Discharge Plan: SNF    Current Diagnoses: Patient Active Problem List   Diagnosis Date Noted  . Acute blood loss anemia   . Thrombocytopenia ([place])   . Stage 3 chronic kidney disease   . Acute kidney injury ([place]) 09/18/2016  . S/P AKA (above knee amputation) unilateral, left ([place]) 09/18/2016  . Leukocytosis 09/18/2016  . Hyponatremia   . GI bleed   . Post-op pain   . Sepsis ([place]) 09/08/2016  . Left leg DVT ([place]) 09/08/2016  . Hyperkalemia 09/08/2016  . Acute renal failure (ARF) ([place]) 09/08/2016  . Gastric cancer ([place]) 09/06/2016  . Pulmonary emboli ([place]) 08/28/2016  . Anemia 08/28/2016    Orientation RESPIRATION BLADDER Height & Weight     Self, Time, Situation, Place  Normal Continent Weight: 140 lb (63.5 kg) Height:  5\' 2"  (157.5 cm)  BEHAVIORAL SYMPTOMS/MOOD NEUROLOGICAL BOWEL NUTRITION STATUS      Continent Diet (regular)  AMBULATORY STATUS COMMUNICATION OF NEEDS Skin   Extensive Assist Verbally Normal                       Personal Care Assistance Level of Assistance  Bathing, Dressing Bathing Assistance: Maximum assistance   Dressing Assistance: Maximum assistance     Functional Limitations Info             SPECIAL CARE FACTORS FREQUENCY  PT (By licensed PT), OT (By licensed OT)     PT Frequency: 5/wk OT  Frequency: 5/wk            Contractures      Additional Factors Info  Code Status, Allergies, Insulin Sliding Scale Code Status Info: FULL Allergies Info: NKA   Insulin Sliding Scale Info: 4/day       Current Medications (09/20/2016):  This is the current hospital active medication list Current Facility-Administered Medications  Medication Dose Route Frequency Provider Last Rate Last Dose  . 0.9 %  sodium chloride infusion   Intravenous Continuous Elam Dutch, MD      . acetaminophen (TYLENOL) tablet 650 mg  650 mg Oral Q6H PRN Radene Gunning, NP       Or  . acetaminophen (TYLENOL) suppository 650 mg  650 mg Rectal Q6H PRN Black, Lezlie Octave, NP      . feeding supplement (ENSURE ENLIVE) (ENSURE ENLIVE) liquid 237 mL  237 mL Oral BID BM Eugenie Filler, MD      . HYDROcodone-acetaminophen (NORCO/VICODIN) 5-325 MG per tablet 1-2 tablet  1-2 tablet Oral Q4H PRN Radene Gunning, NP   2 tablet at 09/20/16 1028  . insulin aspart (novoLOG) injection 0-5 Units  0-5 Units Subcutaneous QHS Black, Karen M, NP      . insulin aspart (novoLOG) injection 0-9 Units  0-9 Units Subcutaneous TID WC Radene Gunning, NP   1 Units at 09/19/16  1733  . lactated ringers infusion   Intravenous Continuous Eugenie Filler, MD 100 mL/hr at 09/20/16 0510    . pneumococcal 23 valent vaccine (PNU-IMMUNE) injection 0.5 mL  0.5 mL Intramuscular Prior to discharge Eugenie Filler, MD      . polyethylene glycol (MIRALAX / GLYCOLAX) packet 17 g  17 g Oral Daily Rama, Venetia Maxon, MD   17 g at 09/19/16 0959  . senna-docusate (Senokot-S) tablet 1 tablet  1 tablet Oral QHS PRN Black, Lezlie Octave, NP      . sodium chloride flush (NS) 0.9 % injection 3 mL  3 mL Intravenous Q12H Black, Karen M, NP      . sodium polystyrene (KAYEXALATE) 15 GM/60ML suspension 30 g  30 g Oral Once Black, Karen M, NP      . zolpidem (AMBIEN) tablet 5 mg  5 mg Oral QHS PRN Radene Gunning, NP         Discharge Medications: Please see  discharge summary for a list of discharge medications.  Relevant Imaging Results:  Relevant Lab Results:   Additional Information SS#: [id number]  Jorge Ny, LCSW

## 2016-09-21 ENCOUNTER — Other Ambulatory Visit (HOSPITAL_COMMUNITY): Payer: 59

## 2016-09-21 ENCOUNTER — Inpatient Hospital Stay (HOSPITAL_COMMUNITY)
Admission: RE | Admit: 2016-09-21 | Discharge: 2016-09-28 | DRG: 560 | Disposition: A | Discharge status: Other Acute Inpatient Hospital | Payer: 59 | Source: Intra-hospital | Attending: Physical Medicine & Rehabilitation | Admitting: Physical Medicine & Rehabilitation

## 2016-09-21 ENCOUNTER — Ambulatory Visit (HOSPITAL_COMMUNITY): Payer: 59 | Admitting: Oncology

## 2016-09-21 DIAGNOSIS — Z9221 Personal history of antineoplastic chemotherapy: Secondary | ICD-10-CM

## 2016-09-21 DIAGNOSIS — C169 Malignant neoplasm of stomach, unspecified: Secondary | ICD-10-CM | POA: Diagnosis present

## 2016-09-21 DIAGNOSIS — D72829 Elevated white blood cell count, unspecified: Secondary | ICD-10-CM | POA: Diagnosis present

## 2016-09-21 DIAGNOSIS — Z86711 Personal history of pulmonary embolism: Secondary | ICD-10-CM

## 2016-09-21 DIAGNOSIS — R739 Hyperglycemia, unspecified: Secondary | ICD-10-CM | POA: Diagnosis not present

## 2016-09-21 DIAGNOSIS — Z95828 Presence of other vascular implants and grafts: Secondary | ICD-10-CM

## 2016-09-21 DIAGNOSIS — Z86718 Personal history of other venous thrombosis and embolism: Secondary | ICD-10-CM | POA: Diagnosis not present

## 2016-09-21 DIAGNOSIS — D696 Thrombocytopenia, unspecified: Secondary | ICD-10-CM | POA: Diagnosis present

## 2016-09-21 DIAGNOSIS — G8918 Other acute postprocedural pain: Secondary | ICD-10-CM | POA: Diagnosis present

## 2016-09-21 DIAGNOSIS — E875 Hyperkalemia: Secondary | ICD-10-CM | POA: Diagnosis present

## 2016-09-21 DIAGNOSIS — D62 Acute posthemorrhagic anemia: Secondary | ICD-10-CM | POA: Diagnosis present

## 2016-09-21 DIAGNOSIS — I2609 Other pulmonary embolism with acute cor pulmonale: Secondary | ICD-10-CM

## 2016-09-21 DIAGNOSIS — Z89612 Acquired absence of left leg above knee: Secondary | ICD-10-CM | POA: Diagnosis not present

## 2016-09-21 DIAGNOSIS — Z8249 Family history of ischemic heart disease and other diseases of the circulatory system: Secondary | ICD-10-CM | POA: Diagnosis not present

## 2016-09-21 DIAGNOSIS — E8770 Fluid overload, unspecified: Secondary | ICD-10-CM | POA: Diagnosis present

## 2016-09-21 DIAGNOSIS — T502X5A Adverse effect of carbonic-anhydrase inhibitors, benzothiadiazides and other diuretics, initial encounter: Secondary | ICD-10-CM | POA: Diagnosis not present

## 2016-09-21 DIAGNOSIS — E860 Dehydration: Secondary | ICD-10-CM | POA: Diagnosis present

## 2016-09-21 DIAGNOSIS — R600 Localized edema: Secondary | ICD-10-CM

## 2016-09-21 DIAGNOSIS — R0600 Dyspnea, unspecified: Secondary | ICD-10-CM | POA: Diagnosis not present

## 2016-09-21 DIAGNOSIS — N179 Acute kidney failure, unspecified: Secondary | ICD-10-CM | POA: Diagnosis present

## 2016-09-21 DIAGNOSIS — F4323 Adjustment disorder with mixed anxiety and depressed mood: Secondary | ICD-10-CM | POA: Diagnosis not present

## 2016-09-21 DIAGNOSIS — IMO0002 Reserved for concepts with insufficient information to code with codable children: Secondary | ICD-10-CM

## 2016-09-21 DIAGNOSIS — Z79899 Other long term (current) drug therapy: Secondary | ICD-10-CM | POA: Diagnosis not present

## 2016-09-21 DIAGNOSIS — R188 Other ascites: Secondary | ICD-10-CM | POA: Diagnosis not present

## 2016-09-21 DIAGNOSIS — Z4781 Encounter for orthopedic aftercare following surgical amputation: Principal | ICD-10-CM

## 2016-09-21 DIAGNOSIS — E871 Hypo-osmolality and hyponatremia: Secondary | ICD-10-CM | POA: Diagnosis present

## 2016-09-21 DIAGNOSIS — R601 Generalized edema: Secondary | ICD-10-CM | POA: Diagnosis not present

## 2016-09-21 DIAGNOSIS — I2782 Chronic pulmonary embolism: Secondary | ICD-10-CM

## 2016-09-21 DIAGNOSIS — R0602 Shortness of breath: Secondary | ICD-10-CM

## 2016-09-21 DIAGNOSIS — R339 Retention of urine, unspecified: Secondary | ICD-10-CM | POA: Diagnosis not present

## 2016-09-21 LAB — GLUCOSE, CAPILLARY
GLUCOSE-CAPILLARY: 110 mg/dL — AB (ref 65–99)
GLUCOSE-CAPILLARY: 99 mg/dL (ref 65–99)
GLUCOSE-CAPILLARY: 105 mg/dL — AB (ref 65–99)
GLUCOSE-CAPILLARY: 91 mg/dL (ref 65–99)
GLUCOSE-CAPILLARY: 114 mg/dL — AB (ref 65–99)

## 2016-09-21 LAB — CBC WITH DIFFERENTIAL/PLATELET
BASOS PCT: 0 %
Basophils Absolute: 0 10*3/uL (ref 0.0–0.1)
EOS ABS: 0.1 10*3/uL (ref 0.0–0.7)
Eosinophils Relative: 1 %
HCT: 20.8 % — ABNORMAL LOW (ref 36.0–46.0)
HEMOGLOBIN: 6.6 g/dL — AB (ref 12.0–15.0)
LYMPHS ABS: 1.2 10*3/uL (ref 0.7–4.0)
Lymphocytes Relative: 8 %
MCH: 27.8 pg (ref 26.0–34.0)
MCHC: 31.7 g/dL (ref 30.0–36.0)
MCV: 87.8 fL (ref 78.0–100.0)
Monocytes Absolute: 0.4 10*3/uL (ref 0.1–1.0)
Monocytes Relative: 2 %
NEUTROS ABS: 14.4 10*3/uL — AB (ref 1.7–7.7)
NEUTROS PCT: 89 %
Platelets: 74 10*3/uL — ABNORMAL LOW (ref 150–400)
RBC: 2.37 MIL/uL — AB (ref 3.87–5.11)
RDW: 17.9 % — ABNORMAL HIGH (ref 11.5–15.5)
WBC: 16.1 10*3/uL — AB (ref 4.0–10.5)

## 2016-09-21 LAB — BASIC METABOLIC PANEL
Anion gap: 7 (ref 5–15)
BUN: 28 mg/dL — ABNORMAL HIGH (ref 6–20)
CHLORIDE: 104 mmol/L (ref 101–111)
CO2: 22 mmol/L (ref 22–32)
Calcium: 7.9 mg/dL — ABNORMAL LOW (ref 8.9–10.3)
Creatinine, Ser: 0.82 mg/dL (ref 0.44–1.00)
GFR calc non Af Amer: >60 mL/min (ref >60)
Glucose, Bld: 111 mg/dL — ABNORMAL HIGH (ref 65–99)
Potassium: 3.4 mmol/L — ABNORMAL LOW (ref 3.5–5.1)
SODIUM: 133 mmol/L — AB (ref 135–145)

## 2016-09-21 LAB — PREPARE RBC (CROSSMATCH)

## 2016-09-21 LAB — MAGNESIUM: MAGNESIUM: 1.7 mg/dL (ref 1.7–2.4)

## 2016-09-21 MED ORDER — ONDANSETRON HCL 4 MG PO TABS: 4.0000 mg | ORAL_TABLET | Freq: Four times a day (QID) | ORAL | Status: DC | PRN

## 2016-09-21 MED ORDER — ENSURE ENLIVE PO LIQD: 237.0000 mL | Freq: Two times a day (BID) | ORAL | 12 refills | Status: AC

## 2016-09-21 MED ORDER — ACETAMINOPHEN 325 MG PO TABS
650.0000 mg | ORAL_TABLET | Freq: Four times a day (QID) | ORAL | Status: DC | PRN
  Administered 2016-09-22 – 2016-09-27 (×2): 650 mg via ORAL
  Filled 2016-09-21: qty 2

## 2016-09-21 MED ORDER — ACETAMINOPHEN 650 MG RE SUPP: 650.0000 mg | Freq: Four times a day (QID) | RECTAL | Status: DC | PRN

## 2016-09-21 MED ORDER — HYDROCODONE-ACETAMINOPHEN 5-325 MG PO TABS
1.0000 | ORAL_TABLET | ORAL | Status: DC | PRN
  Administered 2016-09-21: 2 via ORAL
  Administered 2016-09-22: 1 via ORAL
  Administered 2016-09-22 – 2016-09-27 (×9): 2 via ORAL
  Administered 2016-09-27: 1 via ORAL
  Filled 2016-09-21 (×12): qty 2

## 2016-09-21 MED ORDER — ENSURE ENLIVE PO LIQD
237.0000 mL | Freq: Two times a day (BID) | ORAL | Status: DC
  Administered 2016-09-21 – 2016-09-26 (×8): 237 mL via ORAL

## 2016-09-21 MED ORDER — ONDANSETRON HCL 4 MG/2ML IJ SOLN
4.0000 mg | Freq: Four times a day (QID) | INTRAMUSCULAR | Status: DC | PRN
Start: 2016-09-21 — End: 2016-09-28

## 2016-09-21 MED ORDER — INSULIN ASPART 100 UNIT/ML ~~LOC~~ SOLN: 0.0000 [IU] | Freq: Every day | SUBCUTANEOUS | Status: DC

## 2016-09-21 MED ORDER — SODIUM CHLORIDE 0.9 % IV SOLN
Freq: Once | INTRAVENOUS | Status: AC
  Administered 2016-09-21: 06:00:00 via INTRAVENOUS

## 2016-09-21 MED ORDER — SORBITOL 70 % SOLN
30.0000 mL | Freq: Every day | Status: DC | PRN
  Administered 2016-09-27: 30 mL via ORAL
  Filled 2016-09-21: qty 30

## 2016-09-21 NOTE — Progress Notes (Signed)
Patient in a stable condition, report called to nurse on 5MW.

## 2016-09-21 NOTE — Discharge Summary (Signed)
Physician Discharge Summary  Jodi Collins VZD:[id number] DOB: [birth year] DOA: 09/18/2016  PCP: Glendale Chard, MD  Admit date: 09/18/2016 Discharge date: 09/21/2016  Admitted From: Home Disposition:  Inpatient Rehab  Recommendations for Outpatient Follow-up:  1. Follow up with provider at Inpatient rehab at earliest convenience 2. Please obtain BMP/CBC at earliest convenience  3.   Please follow up with Dr. Oneida Alar from vascular surgery as scheduled. Wound care per his recommendations  4.   Follow-up with oncology and primary care provider in a week's time 5.   Patient might benefit from hospice evaluation if condition worsens.  Home Health: No  Equipment/Devices: None  Discharge Condition: Guarded CODE STATUS: Full  Diet recommendation: Regular   Brief/Interim Summary: 66 year old female with history of left lower extremity DVT/PE was on Xarelto which was subsequently discontinued secondary to a GI bleed from gastric cancer. Patient underwent left AKA by Dr.Fields after patient developed necrotic toes and epidermolysis of the forefoot and foot. Patient also noted to be anemic with a hemoglobin of 6.7. Patient also noted to be hyperkalemic. [facility] admitted patient due to her multiple comorbidities.    Discharge Diagnoses:  Principal Problem:   S/P AKA (above knee amputation) unilateral, left (HCC) Active Problems:   Anemia   Gastric cancer (HCC)   Left leg DVT (HCC)   Hyperkalemia   Hyponatremia   Acute kidney injury ([place])   GI bleed   Leukocytosis   Post-op pain   Acute blood loss anemia   Thrombocytopenia (HCC)   Stage 3 chronic kidney disease  Status post left AKA secondary to left foot ischemia Patient underwent left AKA 09/18/2016 by vascular surgery. - Wound care as per vascular surgery recommendations  acute blood loss anemia/iron deficiency anemia - Probably secondary to gastric cancer and recent amputation -Status post transfusion.  Hemoglobin is 6.6 today, patient received 1 more unit packed red cells. Follow H&H as an outpatient and outpatient oncology follow-up.  hyperkalemia Questionable etiology. Status post Kayexalate. Resolved.  acute kidney injury Likely secondary to prerenal azotemia. Improved  leukocytosis Likely secondary to left foot ischemia. Improving. Monitor off antibiotics.  history of left lower extremity DVT and PE Status post IVC filter placement. Patient was on anticoagulation which was discontinued secondary to GI bleed secondary to gastric cancer.  hyponatremia Improving.   history of gastric cancer Diagnosed recently. Upper endoscopy 08/30/2016 revealed a 4 cm facial hernia as well as a large fungating mass with no bleeding, no stigmata of recent bleeding noted at the GE junction. Biopsies taken. Outpatient follow-up with oncology. Prognosis is guarded to poor. - Outpatient hospice evaluation if condition worsens  Hypoalbuminemia - Follow dietary/nutrition recommendations   Discharge Instructions  Discharge Instructions    Call MD for:  difficulty breathing, headache or visual disturbances    Complete by:  As directed    Call MD for:  extreme fatigue    Complete by:  As directed    Call MD for:  hives    Complete by:  As directed    Call MD for:  persistant dizziness or light-headedness    Complete by:  As directed    Call MD for:  persistant nausea and vomiting    Complete by:  As directed    Call MD for:  redness, tenderness, or signs of infection (pain, swelling, redness, odor or green/yellow discharge around incision site)    Complete by:  As directed    Call MD for:  severe uncontrolled pain    Complete  by:  As directed    Call MD for:  temperature >100.4    Complete by:  As directed    Diet general    Complete by:  As directed    Discharge instructions    Complete by:  As directed    Wound care as per Vascular surgery recommendations   Increase activity slowly     Complete by:  As directed      Allergies as of 09/21/2016      Reactions   No Known Allergies       Medication List    TAKE these medications   dexamethasone 4 MG tablet Commonly known as:  DECADRON Take 2 tablets (8 mg total) by mouth daily. Start the day after chemotherapy for 2 days. Take with food.   feeding supplement (ENSURE ENLIVE) Liqd Take 237 mLs by mouth 2 (two) times daily between meals.   fluorouracil CALGB 62229 in sodium chloride 0.9 % 150 mL Inject into the vein over 96 hr. Every 2 weeks, wears pump for 46 hours   leucovorin in dextrose 5 % 250 mL Inject into the vein once. Every 2 weeks   lidocaine-prilocaine cream Commonly known as:  EMLA Apply to affected area once   Misc. Devices Misc Wheelchair   ondansetron 8 MG tablet Commonly known as:  ZOFRAN Take 1 tablet (8 mg total) by mouth 2 (two) times daily as needed for refractory nausea / vomiting. Start on day 3 after chemotherapy.   OXALIPLATIN IV Inject into the vein. Every 2 weeks   oxyCODONE-acetaminophen 5-325 MG tablet Commonly known as:  PERCOCET/ROXICET Take 1-2 tablets by mouth every 4 (four) hours as needed for severe pain.   polyethylene glycol powder powder Commonly known as:  GLYCOLAX/MIRALAX 1 capful daily as needed What changed:  how much to take  how to take this  when to take this  additional instructions   prochlorperazine 10 MG tablet Commonly known as:  COMPAZINE Take 1 tablet (10 mg total) by mouth every 6 (six) hours as needed (Nausea or vomiting).      Follow-up Information    Glendale Chard, MD Follow up in 1 week(s).   Specialty:  Internal Medicine Contact information: [address] [place] [address] [phone]        Elam Dutch, MD Follow up.   Specialties:  Vascular Surgery, Cardiology Why:  keep scheduled appointment Contact information: [place] [address] [phone]        Twana First, MD  Follow up in 1 week(s).   Specialty:  Oncology Contact information: [address] [place] [address] [phone]          Allergies  Allergen Reactions  . No Known Allergies     Consultations: Vascular surgery  Procedures/Studies: Left AKA by Dr.Fields on 09/18/16 Ct Angio Chest W/cm &/or Wo Cm  Result Date: 08/28/2016 CLINICAL DATA:  66 year old female with a history of shortness of breath EXAM: CT ANGIOGRAPHY CHEST CT ABDOMEN AND PELVIS WITH CONTRAST TECHNIQUE: Multidetector CT imaging of the chest was performed using the standard protocol during bolus administration of intravenous contrast. Multiplanar CT image reconstructions and MIPs were obtained to evaluate the vascular anatomy. Multidetector CT imaging of the abdomen and pelvis was performed using the standard protocol during bolus administration of intravenous contrast. CONTRAST:  100 cc Isovue 370 COMPARISON:  None. FINDINGS: CTA CHEST FINDINGS Cardiovascular: Heart: No cardiomegaly. No pericardial fluid/thickening. No significant coronary calcifications. Right and left ventricles  are somewhat compressed by the large left-sided diaphragmatic herniation. Estimated diameter of the left ventricle measures 20 mm with estimated diameter of the right ventricle measuring 43 mm. Aorta: Unremarkable course, caliber, contour of the thoracic aorta. No aneurysm or dissection flap. No periaortic fluid. Pulmonary arteries: Filling defects of the right-sided pulmonary arteries involving the segmental branches of the right lower lobe, right middle lobe, right upper lobe. No filling defects within left-sided pulmonary arteries identified. Mediastinum/Nodes: Mediastinal structures somewhat compressed by the diaphragmatic hernia. Lymph nodes are present within the mediastinum, with the lowest peritracheal lymph nodes borderline enlarged. Lungs/Pleura: Right lung with no pleural effusion or confluent airspace disease. Atelectatic changes at the base of  the left lung.  No pneumothorax. Small nodule within the right upper lobe (image 30 of series 6). Musculoskeletal: No displaced fracture. Degenerative changes of the spine. Review of the MIP images confirms the above findings. CT ABDOMEN and PELVIS FINDINGS Hepatobiliary: Rounded hypodense lesion of the inferior right liver measures 7 mm (image 22 of series 12) and 10 mm (image 26 of series 12). Unremarkable appearance the gallbladder. Pancreas: Unremarkable appearance of the pancreas. Spleen: Unremarkable appearance of spleen Adrenals/Urinary Tract: Unremarkable appearance the bilateral adrenal glands. No evidence of left-sided or right-sided hydronephrosis. No nephrolithiasis. No perinephric stranding. On the right there is an ill-defined hypodense/ hypoenhancing region at the posterior/ lateral cortex of the kidney measuring 10 mm. Stomach/Bowel: Herniation of the majority of the stomach into the left chest through diaphragmatic hernia. The splenic flexure is herniated into the left chest. No evidence of abnormally distended small bowel or colon. Vascular/Lymphatic: Extensive nodularity of the mesenteric fat associated with the stomach, splenic flexure, and the mesenteric fat herniated into the left chest. Nodular tissue associated with the vasculature. There is circumferential soft tissue surrounding the left gastric artery after the origin from the celiac artery soft tissue essentially in cases the gastric artery just after the origin. Hazy infiltration of the mesenteric fat within the left upper quadrant. Enhancing peritoneal surfaces in the low abdomen/pelvis, involving nearly all the visualized at adnexa and the mesenteric fat associated with small bowel. Small volume ascites in the abdomen/pelvis. Unremarkable course caliber and contour of the abdominal aorta. No significant atherosclerotic changes. Proximal femoral vasculature patent. Nonocclusive venous thrombus within the left iliac system at the  confluence of internal iliac vein in the external iliac vein. Proximal femoral veins unremarkable. Reproductive: Enhancing soft tissue nodularity associated with the right and left adnexa, with the surfaces of the broad ligaments in case with enhancing soft tissue. Other: Enhancing soft tissue within the umbilical region, likely pathologic lymph node. Musculoskeletal: No displaced fracture. No significant degenerative changes of the hips. Review of the MIP images confirms the above findings. IMPRESSION: The study is positive for right-sided pulmonary emboli involving segmental and subsegmental branches. Assessing for heart strain is limited given the distortion secondary to left-sided diaphragmatic hernia. If there is concern for right-sided heart strain, correlation with cardiac ECHO may be useful. Left-sided diaphragmatic hernia, involving majority of the stomach, splenic flexure, and large portion of the associated mesentery. 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 Alternative site of a presumed malignancy of the left upper quadrant could be the splenic flexure. Correlation with endoscopy and possibly colonoscopy recommended. 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 Enhancing lesion within the umbilical region, likely metastatic involvement of lymph node. Small hypodense lesions of the right liver, too small to characterize and possibly benign cysts. Correlation with any future PET-CT may be useful. Low-density lesion of the lateral cortex of the right kidney. Differential diagnosis includes kidney infarction, infection, or tumor implant. Small nodule of the right upper lobe  measuring 4 mm. Correlation with future imaging recommended. Nonocclusive left iliac DVT. These results were called by telephone at the time of interpretation on 08/28/2016 at 5:52 pm to Dr. Merrily Pew , who verbally acknowledged these results. Signed, Dulcy Fanny. Earleen Newport, DO [facility] Radiology Electronically Signed   By: Corrie Mckusick D.O.   On: 08/28/2016 17:57   Ct Abdomen Pelvis W Contrast  Result Date: 08/28/2016 CLINICAL DATA:  66 year old female with a history of shortness of breath EXAM: CT ANGIOGRAPHY CHEST CT ABDOMEN AND PELVIS WITH CONTRAST TECHNIQUE: Multidetector CT imaging of the chest was performed using the standard protocol during bolus administration of intravenous contrast. Multiplanar CT image reconstructions and MIPs were obtained to evaluate the vascular anatomy. Multidetector CT imaging of the abdomen and pelvis was performed using the standard protocol during bolus administration of intravenous contrast. CONTRAST:  100 cc Isovue 370 COMPARISON:  None. FINDINGS: CTA CHEST FINDINGS Cardiovascular: Heart: No cardiomegaly. No pericardial fluid/thickening. No significant coronary calcifications. Right and left ventricles are somewhat compressed by the large left-sided diaphragmatic herniation. Estimated diameter of the left ventricle measures 20 mm with estimated diameter of the right ventricle measuring 43 mm. Aorta: Unremarkable course, caliber, contour of the thoracic aorta. No aneurysm or dissection flap. No periaortic fluid. Pulmonary arteries: Filling defects of the right-sided pulmonary arteries involving the segmental branches of the right lower lobe, right middle lobe, right upper lobe. No filling defects within left-sided pulmonary arteries identified. Mediastinum/Nodes: Mediastinal structures somewhat compressed by the diaphragmatic hernia. Lymph nodes are present within the mediastinum, with the lowest peritracheal lymph nodes  borderline enlarged. Lungs/Pleura: Right lung with no pleural effusion or confluent airspace disease. Atelectatic changes at the base of the left lung.  No pneumothorax. Small nodule within the right upper lobe (image 30 of series 6). Musculoskeletal: No displaced fracture. Degenerative changes of the spine. Review of the MIP images confirms the above findings. CT ABDOMEN and PELVIS FINDINGS Hepatobiliary: Rounded hypodense lesion of the inferior right liver measures 7 mm (image 22 of series 12) and 10 mm (image 26 of series 12). Unremarkable appearance the gallbladder. Pancreas: Unremarkable appearance of the pancreas. Spleen: Unremarkable appearance of spleen Adrenals/Urinary Tract: Unremarkable appearance the bilateral adrenal glands. No evidence of left-sided or right-sided hydronephrosis. No nephrolithiasis. No perinephric stranding. On the right there is an ill-defined hypodense/ hypoenhancing region at the posterior/ lateral cortex of the kidney measuring 10 mm. Stomach/Bowel: Herniation of the majority of the stomach into the left chest through diaphragmatic hernia. The splenic flexure is herniated into the left chest. No evidence of abnormally distended small bowel or colon. Vascular/Lymphatic: Extensive nodularity of the mesenteric fat associated with the stomach, splenic flexure, and the mesenteric fat herniated into the left chest. Nodular tissue associated with the vasculature. There is circumferential soft tissue surrounding the left gastric artery after the origin from the celiac artery soft tissue essentially in cases the gastric artery just after the origin. Hazy infiltration of the mesenteric fat within the left upper quadrant. Enhancing peritoneal surfaces in the low abdomen/pelvis, involving  nearly all the visualized at adnexa and the mesenteric fat associated with small bowel. Small volume ascites in the abdomen/pelvis. Unremarkable course caliber and contour of the abdominal aorta. No  significant atherosclerotic changes. Proximal femoral vasculature patent. Nonocclusive venous thrombus within the left iliac system at the confluence of internal iliac vein in the external iliac vein. Proximal femoral veins unremarkable. Reproductive: Enhancing soft tissue nodularity associated with the right and left adnexa, with the surfaces of the broad ligaments in case with enhancing soft tissue. Other: Enhancing soft tissue within the umbilical region, likely pathologic lymph node. Musculoskeletal: No displaced fracture. No significant degenerative changes of the hips. Review of the MIP images confirms the above findings. IMPRESSION: The study is positive for right-sided pulmonary emboli involving segmental and subsegmental branches. Assessing for heart strain is limited given the distortion secondary to left-sided diaphragmatic hernia. If there is concern for right-sided heart strain, correlation with cardiac ECHO may be useful. Left-sided diaphragmatic hernia, involving majority of the stomach, splenic flexure, and large portion of the associated mesentery. 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 Alternative site of a presumed malignancy of the left upper quadrant could be the splenic flexure. Correlation with endoscopy and possibly colonoscopy recommended. 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 Enhancing lesion within the umbilical region, likely metastatic involvement of lymph node. Small hypodense lesions of the right liver, too small to characterize and possibly benign cysts. Correlation with any future PET-CT may be useful. Low-density lesion of the lateral cortex of the  right kidney. Differential diagnosis includes kidney infarction, infection, or tumor implant. Small nodule of the right upper lobe measuring 4 mm. Correlation with future imaging recommended. Nonocclusive left iliac DVT. These results were called by telephone at the time of interpretation on 08/28/2016 at 5:52 pm to Dr. Merrily Pew , who verbally acknowledged these results. Signed, Dulcy Fanny. Earleen Newport, DO [facility] Radiology Electronically Signed   By: Corrie Mckusick D.O.   On: 08/28/2016 17:57   Ir Ivc Filter Plmt / S&i /img Guid/mod Sed  Result Date: 08/29/2016 INDICATION: 66 year old female with pulmonary embolism and left iliac DVT. Risk factor of new malignancy. Anti coagulation contraindicated secondary to presumed gastrointestinal hemorrhage EXAM: ULTRASOUND GUIDED ACCESS RIGHT INTERNAL JUGULAR VEIN. PLACEMENT OF RETRIEVABLE IVC FILTER VIA IJ APPROACH MEDICATIONS: None. ANESTHESIA/SEDATION: 1.5 mg IV Versed; 75 mcg IV Fentanyl Moderate Sedation Time:  10 minutes The patient was continuously monitored during the procedure by the interventional radiology nurse under my direct supervision. FLUOROSCOPY TIME:  Fluoroscopy Time: 0 minutes 36 seconds (37 mGy). COMPLICATIONS: None PROCEDURE: The procedure, risks, benefits, and alternatives were explained to the patient. Specific risks discussed include bleeding, infection, contrast reaction, renal failure, IVC filter fracture, migration, ileo caval thrombus (3% incidence), need for further procedure, need for further surgery, pulmonary embolism, cardiopulmonary collapse, death. Questions regarding the procedure were encouraged and answered. The patient understands and consents to the procedure. Ultrasound survey was performed with images stored and sent to PACs. The neck was prepped with Betadine in a sterile fashion, and a sterile drape was applied covering the operative field. A sterile gown and sterile gloves were  used for the procedure. Local anesthesia was provided with 1% Lidocaine. A micropuncture  needle was used access the right internal jugular vein under ultrasound. With excellent venous blood flow returned, and an .018 micro wire was passed through the needle. Small incision was made with an 11 blade scalpel. The needle was removed, and a micropuncture sheath was placed over the wire. The inner dilator and wire were removed, and an 035 B[place]on wire was advanced under fluoroscopy into the IVC. Serial dilation of the soft tissue tract was performed with an 8 [place] dilator and subsequently a 10 [place] dilator. The delivery sheath for a retrievable Bard Denali filter was passed over the Bentson wire into the IVC. The wire was removed and small contrast was used to confirm IVC location. IVC cavagram performed. Dilator was removed, and the IVC filter was then delivered, positioned below the lowest renal vein. Repeat cavagram performed, and the catheter was removed. Manual pressure was used for hemostasis. Patient tolerated the procedure well and remained hemodynamically stable throughout. No complications were encountered and no significant blood loss was encounter. IMPRESSION: Status post retrievable IVC filter placement via IJ approach. Signed, Dulcy Fanny. Earleen Newport DO [facility] Radiology PLAN: This IVC filter is potentially retrievable. The patient may be assessed for filter retrieval by Interventional Radiology in approximately 8-12 weeks. Further recommendations regarding filter retrieval, continued surveillance or declaration of device permanence, can be made at that time. Electronically Signed   By: Corrie Mckusick D.O.   On: 08/29/2016 12:18   Ir US Guide Vasc Access Right  Result Date: 09/01/2016 INDICATION: 66 year old female with a history of gastric carcinoma EXAM: IMPLANTED PORT A CATH PLACEMENT WITH ULTRASOUND AND FLUOROSCOPIC GUIDANCE MEDICATIONS: 2.0 g Ancef;  The antibiotic was administered within an appropriate time interval prior to skin puncture. ANESTHESIA/SEDATION: Moderate (conscious) sedation was employed during this procedure. A total of Versed 1.0 mg and Fentanyl 25 mcg was administered intravenously. Moderate Sedation Time: 15 minutes. The patient's level of consciousness and vital signs were monitored continuously by radiology nursing throughout the procedure under my direct supervision. FLUOROSCOPY TIME:  Zero minutes, 6 seconds (1 mGy) COMPLICATIONS: None PROCEDURE: The procedure, risks, benefits, and alternatives were explained to the patient. Questions regarding the procedure were encouraged and answered. The patient understands and consents to the procedure. Ultrasound survey was performed with images stored and sent to PACs. The right neck and chest was prepped with chlorhexidine, and draped in the usual sterile fashion using maximum barrier technique (cap and mask, sterile gown, sterile gloves, large sterile sheet, hand hygiene and cutaneous antiseptic). Antibiotic prophylaxis was provided with 2.0g Ancef administered IV one hour prior to skin incision. Local anesthesia was attained by infiltration with 1% lidocaine without epinephrine. Ultrasound demonstrated patency of the right internal jugular vein, and this was documented with an image. Under real-time ultrasound guidance, this vein was accessed with a 21 gauge micropuncture needle and image documentation was performed. A small dermatotomy was made at the access site with an 11 scalpel. A 0.018" wire was advanced into the SVC and used to estimate the length of the internal catheter. The access needle exchanged for a 68F micropuncture vascular sheath. The 0.018" wire was then removed and a 0.035" wire advanced into the IVC. An appropriate location for the subcutaneous reservoir was selected below the clavicle and an incision was made through the skin and underlying soft tissues. The subcutaneous  tissues were then dissected using a combination of blunt and sharp surgical technique and a pocket was formed. A single lumen power injectable portacatheter was  then tunneled through the subcutaneous tissues from the pocket to the dermatotomy and the port reservoir placed within the subcutaneous pocket. The venous access site was then serially dilated and a peel away vascular sheath placed over the wire. The wire was removed and the port catheter advanced into position under fluoroscopic guidance. The catheter tip is positioned in the cavoatrial junction. This was documented with a spot image. The portacatheter was then tested and found to flush and aspirate well. The port was flushed with saline followed by 100 units/mL heparinized saline. The pocket was then closed in two layers using first subdermal inverted interrupted absorbable sutures followed by a running subcuticular suture. The epidermis was then sealed with Dermabond. The dermatotomy at the venous access site was also seal with Dermabond. Patient tolerated the procedure well and remained hemodynamically stable throughout. No complications encountered and no significant blood loss encountered IMPRESSION: Status post right IJ port catheter placement. Catheter ready for use. Signed, Dulcy Fanny. Earleen Newport, DO [facility] Radiology Electronically Signed   By: Corrie Mckusick D.O.   On: 09/01/2016 14:03   Dg Chest Port 1 View  Result Date: 09/18/2016 CLINICAL DATA:  Postoperative radiograph, for generalized chest pain, acute onset. Initial encounter. 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 The lungs are otherwise grossly clear. No pleural effusion or pneumothorax is seen. The cardiomediastinal silhouette is normal in size. A right-sided chest port is noted ending about the mid SVC. No  acute osseous abnormalities are identified. IMPRESSION: Large left-sided diaphragmatic hernia again noted, with associated atelectasis. Lungs otherwise grossly clear. Electronically Signed   By: Garald Balding M.D.   On: 09/18/2016 21:41   Dg Chest Port 1 View  Result Date: 09/08/2016 CLINICAL DATA:  66 year old female with a history of leg swelling EXAM: PORTABLE CHEST 1 VIEW COMPARISON:  CT 08/28/2016, port catheter 09/01/2016 FINDINGS: Cardiomediastinal silhouette unchanged with left heart border partially obscured by overlying diaphragmatic hernia. 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 No pneumothorax. No large pleural effusion. No confluent airspace disease. Retrocardiac region not well evaluated. Asymmetric elevation of the left hemidiaphragm in this patient with known diaphragmatic hernia better demonstrated on prior CT. IMPRESSION: Re- demonstration of left-sided diaphragmatic hernia, with no definite evidence of lobar pneumonia. 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 Electronically Signed   By: Corrie Mckusick D.O.   On: 09/08/2016 14:55   Ir Fluoro Guide Port Insertion Right  Result Date: 09/01/2016 INDICATION: 66 year old female with a history of gastric carcinoma EXAM: IMPLANTED PORT A CATH PLACEMENT WITH ULTRASOUND AND FLUOROSCOPIC GUIDANCE MEDICATIONS: 2.0 g Ancef; The antibiotic was administered within an appropriate time interval prior to skin puncture. ANESTHESIA/SEDATION: Moderate (conscious) sedation was employed during this procedure. A total of Versed 1.0 mg and Fentanyl 25 mcg was administered intravenously. Moderate Sedation Time: 15 minutes. The patient's level of consciousness and vital signs were monitored continuously by radiology nursing throughout the procedure under my direct supervision. FLUOROSCOPY TIME:  Zero minutes, 6 seconds (1 mGy) COMPLICATIONS: None PROCEDURE: The  procedure, risks, benefits, and alternatives were explained to the patient. Questions regarding the procedure were encouraged and answered. The patient understands and consents to the procedure. Ultrasound survey was performed with images stored and sent to PACs. The right neck and chest was prepped with chlorhexidine, and draped in  the usual sterile fashion using maximum barrier technique (cap and mask, sterile gown, sterile gloves, large sterile sheet, hand hygiene and cutaneous antiseptic). Antibiotic prophylaxis was provided with 2.0g Ancef administered IV one hour prior to skin incision. Local anesthesia was attained by infiltration with 1% lidocaine without epinephrine. Ultrasound demonstrated patency of the right internal jugular vein, and this was documented with an image. Under real-time ultrasound guidance, this vein was accessed with a 21 gauge micropuncture needle and image documentation was performed. A small dermatotomy was made at the access site with an 11 scalpel. A 0.018" wire was advanced into the SVC and used to estimate the length of the internal catheter. The access needle exchanged for a 52F micropuncture vascular sheath. The 0.018" wire was then removed and a 0.035" wire advanced into the IVC. An appropriate location for the subcutaneous reservoir was selected below the clavicle and an incision was made through the skin and underlying soft tissues. The subcutaneous tissues were then dissected using a combination of blunt and sharp surgical technique and a pocket was formed. A single lumen power injectable portacatheter was then tunneled through the subcutaneous tissues from the pocket to the dermatotomy and the port reservoir placed within the subcutaneous pocket. The venous access site was then serially dilated and a peel away vascular sheath placed over the wire. The wire was removed and the port catheter advanced into position under fluoroscopic guidance. The catheter tip is positioned in  the cavoatrial junction. This was documented with a spot image. The portacatheter was then tested and found to flush and aspirate well. The port was flushed with saline followed by 100 units/mL heparinized saline. The pocket was then closed in two layers using first subdermal inverted interrupted absorbable sutures followed by a running subcuticular suture. The epidermis was then sealed with Dermabond. The dermatotomy at the venous access site was also seal with Dermabond. Patient tolerated the procedure well and remained hemodynamically stable throughout. No complications encountered and no significant blood loss encountered IMPRESSION: Status post right IJ port catheter placement. Catheter ready for use. Signed, Dulcy Fanny. Earleen Newport, DO [facility] Radiology Electronically Signed   By: Corrie Mckusick D.O.   On: 09/01/2016 14:03       Subjective: Patient seen and examined at bedside. She denies any overnight fever, nausea, vomiting, chest pain.  Discharge Exam: Vitals:   09/21/16 0705 09/21/16 1003  BP: 107/60 100/61  Pulse: 100 (!) 101  Resp: 18 18  Temp: 97.9 F (36.6 C) 98.1 F (36.7 C)   Vitals:   09/21/16 0612 09/21/16 0645 09/21/16 0705 09/21/16 1003  BP: (!) 120/58 109/62 107/60 100/61  Pulse: 99 (!) 101 100 (!) 101  Resp: 18 18 18 18   Temp: 97.9 F (36.6 C) 98.1 F (36.7 C) 97.9 F (36.6 C) 98.1 F (36.7 C)  TempSrc: Oral Oral Oral Oral  SpO2: 96%  97% 96%  Weight:      Height:        General: Pt is alert, awake, not in acute distress Cardiovascular: Rate controlled, S1/S2 + Respiratory: CTA bilaterally, no wheezing, no rhonchi Abdominal: Soft, NT, ND, bowel sounds + Extremities: 1-2+ right pitting pedal edema; left AKA; no cyanosis    The results of significant diagnostics from this hospitalization (including imaging, microbiology, ancillary and laboratory) are listed below for reference.     Microbiology: Recent  Results (from the past 240 hour(s))  Culture, blood (Routine X 2) w Reflex to ID Panel  Status: None (Preliminary result)   Collection Time: 09/19/16  9:37 AM  Result Value Ref Range Status   Specimen Description BLOOD LEFT ARM  Final   Special Requests IN PEDIATRIC BOTTLE Blood Culture adequate volume  Final   Culture NO GROWTH < 24 HOURS  Final   Report Status PENDING  Incomplete  Culture, blood (Routine X 2) w Reflex to ID Panel     Status: None (Preliminary result)   Collection Time: 09/19/16  9:37 AM  Result Value Ref Range Status   Specimen Description BLOOD RIGHT HAND  Final   Special Requests IN PEDIATRIC BOTTLE Blood Culture adequate volume  Final   Culture NO GROWTH < 24 HOURS  Final   Report Status PENDING  Incomplete  Urine Culture     Status: Abnormal   Collection Time: 09/19/16 10:45 AM  Result Value Ref Range Status   Specimen Description URINE, CLEAN CATCH  Final   Special Requests NONE  Final   Culture MULTIPLE SPECIES PRESENT, SUGGEST RECOLLECTION (A)  Final   Report Status 09/20/2016 FINAL  Final     Labs: BNP (last 3 results)  Recent Labs  08/28/16 1547  BNP 623.7*   Basic Metabolic Panel:  Recent Labs Lab 09/18/16 1035 09/18/16 1509 09/18/16 2149 09/19/16 0252 09/19/16 0937 09/20/16 0243 09/21/16 0316  NA 131* 133* 132* 133*  --  134* 133*  K 6.0* 5.7* 5.4* 5.4* 5.4* 3.0* 3.4*  CL 104  --  105 107  --  106 104  CO2 16*  --  19* 19*  --  18* 22  GLUCOSE 117* 113* 127* 105*  --  101* 111*  BUN 41*  --  41* 43*  --  37* 28*  CREATININE 1.35*  --  1.35* 1.29*  --  0.96 0.82  CALCIUM 8.6*  --  8.1* 8.0*  --  7.9* 7.9*  MG  --   --   --   --   --   --  1.7   Liver Function Tests:  Recent Labs Lab 09/18/16 1035 09/19/16 0252  AST 36 40  ALT 30 20  ALKPHOS 111 82  BILITOT 0.5 1.3*  PROT 6.4* 5.2*  ALBUMIN 2.0* 1.9*   No results for input(s): LIPASE, AMYLASE in the last 168 hours. No results for input(s): AMMONIA in the last 168  hours. CBC:  Recent Labs Lab 09/18/16 1035 09/18/16 1509 09/18/16 2149 09/19/16 0252 09/20/16 0243 09/21/16 0316  WBC 23.1*  --  23.6* 20.8* 18.1* 16.1*  NEUTROABS  --   --   --   --   --  14.4*  HGB 6.7* 6.8* 8.5* 7.8* 7.1* 6.6*  HCT 21.9* 20.0* 25.9* 23.6* 22.3* 20.8*  MCV 84.2  --  86.3 85.5 87.8 87.8  PLT 96*  --  64* 64* 63* 74*   Cardiac Enzymes: No results for input(s): CKTOTAL, CKMB, CKMBINDEX, TROPONINI in the last 168 hours. BNP: Invalid input(s): POCBNP CBG:  Recent Labs Lab 09/20/16 1122 09/20/16 1658 09/20/16 2103 09/21/16 0226 09/21/16 0610  GLUCAP 107* 97 83 110* 99   D-Dimer No results for input(s): DDIMER in the last 72 hours. Hgb A1c  Recent Labs  09/18/16 2149  HGBA1C 5.1   Lipid Profile No results for input(s): CHOL, HDL, LDLCALC, TRIG, CHOLHDL, LDLDIRECT in the last 72 hours. Thyroid function studies No results for input(s): TSH, T4TOTAL, T3FREE, THYROIDAB in the last 72 hours.  Invalid input(s): FREET3 Anemia work up  [employer]  09/19/16  0937  VITAMINB12 3,160*  FOLATE 8.1  FERRITIN 261  TIBC 193*  IRON 18*   Urinalysis    Component Value Date/Time   COLORURINE YELLOW 09/19/2016 1045   APPEARANCEUR CLEAR 09/19/2016 1045   LABSPEC 1.025 09/19/2016 1045   PHURINE 5.0 09/19/2016 1045   GLUCOSEU NEGATIVE 09/19/2016 1045   HGBUR NEGATIVE 09/19/2016 1045   [place] 09/19/2016 [place] 09/19/2016 [place] 09/19/2016 1045   NITRITE NEGATIVE 09/19/2016 1045   LEUKOCYTESUR NEGATIVE 09/19/2016 1045   Sepsis Labs Invalid input(s): PROCALCITONIN,  WBC,  LACTICIDVEN Microbiology Recent Results (from the past 240 hour(s))  Culture, blood (Routine X 2) w Reflex to ID Panel     Status: None (Preliminary result)   Collection Time: 09/19/16  9:37 AM  Result Value Ref Range Status   Specimen Description BLOOD LEFT ARM  Final   Special Requests IN PEDIATRIC BOTTLE Blood Culture adequate  volume  Final   Culture NO GROWTH < 24 HOURS  Final   Report Status PENDING  Incomplete  Culture, blood (Routine X 2) w Reflex to ID Panel     Status: None (Preliminary result)   Collection Time: 09/19/16  9:37 AM  Result Value Ref Range Status   Specimen Description BLOOD RIGHT HAND  Final   Special Requests IN PEDIATRIC BOTTLE Blood Culture adequate volume  Final   Culture NO GROWTH < 24 HOURS  Final   Report Status PENDING  Incomplete  Urine Culture     Status: Abnormal   Collection Time: 09/19/16 10:45 AM  Result Value Ref Range Status   Specimen Description URINE, CLEAN CATCH  Final   Special Requests NONE  Final   Culture MULTIPLE SPECIES PRESENT, SUGGEST RECOLLECTION (A)  Final   Report Status 09/20/2016 FINAL  Final     Time coordinating discharge: 35 minutes  SIGNED:   Aline August, MD  [facility] 09/21/2016, 11:20 AM Pager: [phone]  If 7PM-7AM, please contact night-coverage [email or web address] Password [email or web address]

## 2016-09-21 NOTE — Progress Notes (Signed)
Report called to nurse on 4MW, iv removed, tele dc ccmd notified,  patient transported on a hospital bed by this RN and a NT to [id number], patient belongings taken by patients family.

## 2016-09-21 NOTE — Progress Notes (Signed)
Jodi Arn, Jodi Collins Physician Signed Physical Medicine and Rehabilitation  Consult Note Date of Service: 09/19/2016 6:30 AM  Related encounter: Admission (Discharged) from 09/18/2016 in [place] copied text [place] for attribution information   Physical Medicine and Rehabilitation Consult  Reason for Consult: Decreased functional mobility  Referring Physician: Dr. Rockne Menghini  HPI: Jodi Collins is a 66 y.o. right handed female who lives alone and was independent prior to admission with close his family in [place]. 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 Recently seen in the ER with shortness of breath cough left lower extremity swelling found to have right sided segmental and subsegmental emboli started on heparin therapy discontinued after finding anemia possible GI bleed she did receive a transfusion. During that workup CT abdomen and pelvis showed gastric/stomach cancer confirmed by EGD 08/30/2016 she did receive an IVC filter. A referral was made to outpatient oncology services for plans of chemotherapy. On 09/08/2016 patient with increasing left leg pain swelling and rest pain. Noted necrotic changes left lower extremity and no Doppler signal. 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 Ruta Hinds. Hospital course pain management. Latest hemoglobin 7.8 as well as leukocytosis 20,800. Hyperkalemia received [place]. Physical and occupational therapy evaluations are pending. M.D. has requested physical medicine rehabilitation consult.  Review of Systems  Constitutional: Negative for chills and fever.  HENT: Negative for hearing loss.  Eyes: Negative for blurred vision and double vision.  Respiratory: Negative for cough and shortness of breath.  Cardiovascular: Positive for leg swelling. Negative for chest pain and  palpitations.  Gastrointestinal: Positive for melena. Negative for nausea and vomiting.  Genitourinary: Negative for dysuria, flank pain and hematuria.  Musculoskeletal: Positive for myalgias.  Skin: Negative for rash.  Neurological: Positive for weakness. Negative for seizures.  All other systems reviewed and are negative.        Past Medical History:  Diagnosis Date  . Anemia   . DVT (deep venous thrombosis) ([place]) 08/17/2016  . 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 GI bleed   . Hyperkalemia   . Hyponatremia   . PE (pulmonary thromboembolism) ([place]) 08/28/2016  . Stomach cancer ([place])    stage IV, dx 08/30/16  . Thrombocytopenia ([place])          Past Surgical History:  Procedure Laterality Date  . CERVICAL CONIZATION W/BX  2012   high grade squamous intraepithelial dysplasia. Dr Garwin Brothers.   . COLONOSCOPY W/ POLYPECTOMY  10/2006 and 10/2011   Dr Collene Mares. had adenomatous polyps.   . ESOPHAGOGASTRODUODENOSCOPY N/A 08/30/2016   Procedure: ESOPHAGOGASTRODUODENOSCOPY (EGD); Surgeon: Carol Ada, Jodi Collins; Location: [facility]; Service: Gastroenterology; Laterality: N/A;  . IR FLUORO GUIDE PORT INSERTION RIGHT  09/01/2016  . IR IVC FILTER PLMT / S&I /IMG GUID/MOD SED  08/29/2016  . IR US GUIDE VASC ACCESS RIGHT  09/01/2016           Family History   Problem Relation Age of Onset   . Hypertension Mother    . Diabetes Mother     Social History: reports that she has never smoked. She has never used smokeless tobacco. She reports that she drinks alcohol. She reports that she does not use drugs.  Allergies:  Allergies    Allergen Reactions    . No Known Allergies                 Medications Prior to Admission    Medication Sig Dispense Refill    . oxyCODONE-acetaminophen (PERCOCET/ROXICET) 5-325 MG tablet Take 1-2 tablets by mouth every 4 (four) hours as needed for severe pain. 90 tablet 0    . polyethylene glycol powder (GLYCOLAX/MIRALAX) powder 1 capful daily  as needed (Patient taking differently: Take 17 g by mouth daily. ) 255 g 2    . dexamethasone (DECADRON) 4 MG tablet Take 2 tablets (8 mg total) by mouth daily. Start the day after chemotherapy for 2 days. Take with food. 30 tablet 1    . fluorouracil CALGB 28786 in sodium chloride 0.9 % 150 mL Inject into the vein over 96 hr. Every 2 weeks, wears pump for 46 hours      . leucovorin in dextrose 5 % 250 mL Inject into the vein once. Every 2 weeks      . lidocaine-prilocaine (EMLA) cream Apply to affected area once 30 g 3    . Misc. Devices MISC Wheelchair 1 each 0    . ondansetron (ZOFRAN) 8 MG tablet Take 1 tablet (8 mg total) by mouth 2 (two) times daily as needed for refractory nausea / vomiting. Start on day 3 after chemotherapy. 30 tablet 1    . OXALIPLATIN IV Inject into the vein. Every 2 weeks      . prochlorperazine (COMPAZINE) 10 MG tablet Take 1 tablet (10 mg total) by mouth every 6 (six) hours as needed (Nausea or vomiting). 30 tablet 1     Home:   Functional History:   Functional Status:  Mobility:      ADL:   Cognition:  Cognition  Orientation Level: Oriented X4   Blood pressure 95/60, pulse 91, temperature 97.7 F (36.5 C), temperature source Oral, resp. rate 18, height 5\' 2"  (1.575 m), weight 63.5 kg (140 lb), SpO2 100 %.  Physical Exam  Vitals reviewed.  Constitutional: She is oriented to person, place, and time. She appears well-developed and well-nourished.  HENT:  Head: Normocephalic and atraumatic.  Eyes: EOM are normal. Right eye exhibits no discharge. Left eye exhibits no discharge.  Neck: Normal range of motion. Neck supple. No thyromegaly present.  Cardiovascular: Normal rate, regular rhythm and normal heart sounds.  Respiratory: Effort normal and breath sounds normal. No respiratory distress.  GI: Soft. Bowel sounds are normal. She exhibits no distension.  Musculoskeletal: She exhibits edema and tenderness.  Neurological: She is alert and oriented to  person, place, and time.  Motor: B/l UE 5/5 proximal to distal RLE: 4+/5 proximal to distal LLE: HF 4/5 proximal to distal Sensation intact to light touch  Skin: Skin is warm and dry.  Amputation site is dressed appropriately tender  Psychiatric: She has a normal mood and affect. Her behavior is normal.       Lab Results Last 24 Hours  Revision History                      Routing History

## 2016-09-21 NOTE — Progress Notes (Signed)
I have insurance approval and bed available to admit pt to inpt rehab today. Pt is in agreement. I spoke with Dr. Starla Link and he is in agreement. RN CM and SW made aware. I will make the arrangements to admit today. [phone]

## 2016-09-21 NOTE — IPOC Note (Signed)
Overall Plan of Care [facility]) Patient Details Name: Jodi Collins MRN: [id number] DOB: [birth year]  Admitting Diagnosis: L AKA  Hospital Problems: Principal Problem:   S/P AKA (above knee amputation) unilateral, left (HCC) Active Problems:   Pulmonary emboli (HCC)   Acute kidney injury ([place])   Acute blood loss anemia   Above knee amputation status, left (HCC)   History of DVT (deep vein thrombosis)   Post-operative pain     Functional Problem List: Nursing Pain, Skin Integrity, Bowel, Safety, Nutrition  PT Balance, Endurance, Pain, Safety  OT Balance, Pain, Endurance  SLP    TR         Basic ADL's: OT Grooming, Bathing, Dressing     Advanced  ADL's: OT Simple Meal Preparation, Light Housekeeping     Transfers: PT Bed Mobility, Bed to Chair, Car, Manufacturing systems engineer, Metallurgist: PT Ambulation, Emergency planning/management officer, Stairs     Additional Impairments: OT None  SLP        TR      Anticipated Outcomes Item Anticipated Outcome  Self Feeding Independent  Swallowing      Basic self-care  Modified independent  Toileting  Modified independent   Bathroom Transfers Modified independent  Bowel/Bladder  will remain free from bladder infection while in our care, will have regular BM while in Rehab  Transfers   (mod I)  Locomotion  Mod I ambulation; min A stairs  Communication     Cognition     Pain  Pain will remail in tolerated level with medication and repositions.  Safety/Judgment      Therapy Plan: PT Intensity: Minimum of 1-2 x/day ,45 to 90 minutes PT Frequency: Total of 15 hours over 7 days of combined therapies PT Duration Estimated Length of Stay: 7 to 10 days OT Intensity: Minimum of 1-2 x/day, 45 to 90 minutes OT Frequency: 5 out of 7 days OT Duration/Estimated Length of Stay: 8-10 days         Team Interventions: Nursing Interventions Patient/Family Education, Bladder Management, Bowel Management, Medication  Management, Pain Management, Skin Care/Wound Management, Psychosocial Support  PT interventions Ambulation/gait training, Training and development officer, Community reintegration, Discharge planning, Disease management/prevention, DME/adaptive equipment instruction, Functional mobility training, Neuromuscular re-education, Pain management, Patient/family education, Psychosocial support, Stair training, Therapeutic Activities, Therapeutic Exercise, UE/LE Strength taining/ROM, UE/LE Coordination activities, Wheelchair propulsion/positioning  OT Interventions Training and development officer, Academic librarian, Discharge planning, Pain management, Functional mobility training, Engineer, drilling, Wheelchair propulsion/positioning, Therapeutic Exercise, Patient/family education, Self Care/advanced ADL retraining, Therapeutic Activities  SLP Interventions    TR Interventions    SW/CM Interventions Discharge Planning, Psychosocial Support, Patient/Family Education    Team Discharge Planning: Destination: PT-Home ,OT- Home , SLP-  Projected Follow-up: PT-Home health PT, OT-  Home health OT, 24 hour supervision/assistance, SLP-  Projected Equipment Needs: PT-To be determined, Rolling walker with 5" wheels, OT- To be determined, SLP-  Equipment Details: PT- , OT-  Patient/family involved in discharge planning: PT- Patient,  OT-Patient, SLP-   MD ELOS: 7-10 days. Medical Rehab Prognosis:  Good Assessment: 66 y.o. right handed female with history of DVT /pulmonary emboli May 2018 placed on Xarelto but discontinued due to melena. Recently seen in the ER with shortness of breath cough left lower extremity swelling found to have right sided segmental and subsegmental emboli started on heparin therapy discontinued after finding anemia possible GI bleed she did receive a transfusion. During that workup CT abdomen and pelvis showed gastric/stomach cancer confirmed by EGD 08/30/2016  she did receive an  IVC filter. A referral was made to outpatient oncology services for plans of chemotherapy. On 09/08/2016 patient with increasing left leg pain swelling and rest pain. Noted necrotic changes left lower extremity and no Doppler signal. 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 Ruta Hinds. Hospital course pain management, anemia, hyperkalemia. Pt with functional deficits with mobility, transfers, self-care.  Will set goals for Mod I with PT/OT.    See Team Conference Notes for weekly updates to the plan of care

## 2016-09-21 NOTE — H&P (Signed)
Physical Medicine and Rehabilitation Admission H&P  Chief complaint: Stump pain  HPI: Jodi Collins is a 66 y.o. right handed female who lives alone and was independent prior to admission with close his family in [place]. 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 Recently seen in the ER with shortness of breath cough left lower extremity swelling found to have right sided segmental and subsegmental emboli started on heparin therapy discontinued after finding anemia possible GI bleed she did receive a transfusion. During that workup CT abdomen and pelvis showed gastric/stomach cancer confirmed by EGD 08/30/2016 she did receive an IVC filter. A referral was made to outpatient oncology services for plans of chemotherapy. On 09/08/2016 patient with increasing left leg pain swelling and rest pain. Noted necrotic changes left lower extremity and no Doppler signal. 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 Ruta Hinds. Hospital course pain management. Latest hemoglobin 6.6 and orders for transfusion. Hyperkalemia received Kayexalate. Physical and occupational therapy evaluations completed with recommendations of physical medicine rehabilitation consult. Patient was admitted for a comprehensive rehabilitation program  Review of Systems  Constitutional: Negative for chills.  HENT: Negative for hearing loss.  Eyes: Negative for blurred vision and double vision.  Respiratory: Negative for cough and shortness of breath.  Cardiovascular: Positive for leg swelling. Negative for chest pain and palpitations.  Gastrointestinal: Positive for melena. Negative for nausea and vomiting.  Genitourinary: Negative for dysuria, flank pain and hematuria.  Musculoskeletal: Positive for myalgias.  Skin: Negative for rash.  Neurological: Negative for seizures and weakness.  All other systems reviewed and are negative.       Past Medical History:  Diagnosis Date  . Anemia   . DVT (deep venous thrombosis) ([facility]) 08/17/2016  . 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 GI bleed   . Hyperkalemia   . Hyponatremia   . PE (pulmonary thromboembolism) ([facility]) 08/28/2016  . 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 Thrombocytopenia ([facility])         Past Surgical History:  Procedure Laterality Date  . AMPUTATION Left 09/18/2016   Procedure: AMPUTATION ABOVE KNEE-LEFT; Surgeon: Elam Dutch, MD; Location: [facility] OR; Service: Vascular; Laterality: Left;  . CERVICAL CONIZATION W/BX  2012   high grade squamous intraepithelial dysplasia. Dr Garwin Brothers.   . COLONOSCOPY W/ POLYPECTOMY  10/2006 and 10/2011   Dr Collene Mares. had adenomatous polyps.   . ESOPHAGOGASTRODUODENOSCOPY N/A 08/30/2016   Procedure: ESOPHAGOGASTRODUODENOSCOPY (EGD); Surgeon: Carol Ada, MD; Location: [facility]; Service: Gastroenterology; Laterality: N/A;  . IR FLUORO GUIDE PORT INSERTION RIGHT  09/01/2016  . IR IVC FILTER PLMT / S&I /IMG GUID/MOD SED  08/29/2016  . IR US GUIDE VASC ACCESS RIGHT  09/01/2016        Family History  Problem Relation Age of Onset  . Hypertension Mother   . Diabetes Mother    Social History: reports that she has never smoked. She has never used smokeless tobacco. She reports that she drinks alcohol. She reports that she does not use drugs.  Allergies:      Allergies  Allergen Reactions  . No Known Allergies          Medications Prior to Admission  Medication Sig Dispense Refill  . oxyCODONE-acetaminophen (PERCOCET/ROXICET) 5-325 MG tablet Take 1-2 tablets by mouth every 4 (four) hours as needed for severe pain. 90 tablet 0  .  polyethylene glycol powder (GLYCOLAX/MIRALAX) powder 1 capful daily as needed (Patient taking differently: Take 17 g by mouth daily. ) 255 g 2  . dexamethasone (DECADRON) 4 MG tablet Take 2 tablets (8 mg total) by mouth daily. Start the day after chemotherapy for 2 days. Take  with food. 30 tablet 1  . fluorouracil CALGB 09323 in sodium chloride 0.9 % 150 mL Inject into the vein over 96 hr. Every 2 weeks, wears pump for 46 hours    . leucovorin in dextrose 5 % 250 mL Inject into the vein once. Every 2 weeks    . lidocaine-prilocaine (EMLA) cream Apply to affected area once 30 g 3  . Misc. Devices MISC Wheelchair 1 each 0  . ondansetron (ZOFRAN) 8 MG tablet Take 1 tablet (8 mg total) by mouth 2 (two) times daily as needed for refractory nausea / vomiting. Start on day 3 after chemotherapy. 30 tablet 1  . OXALIPLATIN IV Inject into the vein. Every 2 weeks    . prochlorperazine (COMPAZINE) 10 MG tablet Take 1 tablet (10 mg total) by mouth every 6 (six) hours as needed (Nausea or vomiting). 30 tablet 1   Home:  Home Living  Family/patient expects to be discharged to:: Skilled nursing facility ("probably going to rehab before going home")  Living Arrangements: Alone  Available Help at Discharge: Neighbor, Available PRN/intermittently (family is here from [place] temporarily)  Type of Home: House  Home Access: Stairs to enter  Technical brewer of Steps: 2  Entrance Stairs-Rails: None  Home Layout: Two level, Able to live on main level with bedroom/bathroom  Bathroom Shower/Tub: Administrator, Civil Service: Standard  Home Equipment: None  Functional History:  Prior Function  Level of Independence: Independent  Comments: works as a Equities trader Status:  Mobility:  Bed Mobility  Overal bed mobility: Needs Assistance  Bed Mobility: Supine to Sit  Supine to sit: +2 for physical assistance, Min assist  General bed mobility comments: cues for technique, use of pad to assist hips to EOB and to raise trunk  Transfers  Overall transfer level: Needs assistance  Equipment used: Rolling walker (2 wheeled)  Transfers: Sit to/from Stand, [employer] Transfers  Sit to Stand: +2 physical assistance, Min assist  Stand pivot transfers: +2 physical  assistance, Min assist  General transfer comment: cues for hand placement and technique, assist to rise and steady as pt pivoted toward R to chair. Pt used walker for pivot from bed to chair. Pt initially pivoting on rt foot and then able to take a few backward hops to the chair.    ADL:  ADL  Overall ADL's : Needs assistance/impaired  Eating/Feeding: Independent, Sitting  Grooming: Sitting, Supervision/safety, Set up  Upper Body Bathing: Supervision/ safety, Sitting  Lower Body Bathing: Maximal assistance, Sitting/lateral leans  Upper Body Dressing : Supervision/safety, Sitting  Lower Body Dressing: Maximal assistance, Sitting/lateral leans  Toilet Transfer: +2 for physical assistance, Minimal assistance, RW, BSC, Stand-pivot  Toileting- Clothing Manipulation and Hygiene: Maximal assistance, Sitting/lateral lean  Cognition:  Cognition  Overall Cognitive Status: Within Functional Limits for tasks assessed  Orientation Level: Oriented X4  Cognition  Arousal/Alertness: Awake/alert  Behavior During Therapy: WFL for tasks assessed/performed  Overall Cognitive Status: Within Functional Limits for tasks assessed  Physical Exam:  Blood pressure 96/61, pulse 99, temperature 97.6 F (36.4 C), temperature source Oral, resp. rate 18, height 5\' 2"  (1.575 m), weight 63.5 kg (140 lb), SpO2 96 %.  Physical Exam  Vitals  reviewed.  Constitutional: She appears well-developed.  HENT:  Head: Normocephalic.  Eyes: EOM are normal.  Neck: Normal range of motion. Neck supple. No thyromegaly present.  Cardiovascular: Normal rate and regular rhythm. Exam reveals no friction rub.  No murmur heard.  Respiratory: Effort normal and breath sounds normal. No respiratory distress. She has no wheezes. She has no rales.  GI: Soft. Bowel sounds are normal. She exhibits no distension. There is no tenderness. There is no rebound.  Psychiatric:  Pleasant and cooperative.  Skin. Amputation site dressed appropriately  tender/ACE in place. Skin of RLE intact. Some bruising from blood draw sites  Neurological: She is alert and oriented to person, place, and time.  Motor: B/L UE 5/5 proximal to distal RLE: 4+/5 proximal to distal LLE: 2+5 HF, distal N/A Sensation intact to light touch in all 4's. Cognitively intact.  Lab Results Last 48 Hours  Imaging Results (Last 48 hours)     Medical Problem List and Plan:  1. Decreased functional mobility secondary to left AKA 09/18/2016  -admit to inpatient rehab  2. DVT Prophylaxis/Anticoagulation: SCD RLE. 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  Patient does have an IVC filter.  3. Pain Management: Hydrocodone as needed  -stump protection/edema control  4. Mood: Provide emotional support  5. Neuropsych: This patient is capable of making decisions on her own behalf.  6. Skin/Wound Care: Routine skin checks  7. Fluids/Electrolytes/Nutrition: Routine I&O with follow-up chemistries  8. Acute blood loss anemia. Follow-up CBC  -s/p transfusion 2prbc  -iron supplementation  9. Hyperkalemia. Resolved. Follow-up chemistries upon admit  10. History of gastric/stomach cancer. Follow-up outpatient Dr. Mila Merry  Post Admission Physician Evaluation:  1. Functional deficits secondary to left AKA. 2. Patient is admitted to receive collaborative, interdisciplinary care between the physiatrist, rehab nursing staff, and therapy team. 3. Patient's level of medical complexity and substantial therapy needs in context of that medical necessity cannot be provided at a lesser intensity of care such as a SNF. 4. Patient has experienced substantial functional loss from his/her baseline which was documented above under the "Functional History" and "Functional Status" headings. Judging by the patient's diagnosis, physical exam, and functional history, the patient has potential for functional progress which will result in measurable gains while on inpatient rehab. These gains will be of substantial and practical use upon discharge in facilitating mobility and self-care at the household level. 5. Physiatrist will provide 24 hour management of medical needs as well as oversight of the therapy plan/treatment and provide guidance as appropriate regarding the interaction of the two. 6. The Preadmission Screening has been reviewed and patient status is unchanged unless otherwise stated above. 7. 24 hour rehab nursing will assist with bladder management, bowel management, safety, skin/wound care, disease management, medication administration, pain management and patient  education and help integrate therapy concepts, techniques,education, etc. 8. PT will assess and treat for/with: Lower extremity strength, range of motion, stamina, balance, functional mobility, safety, adaptive techniques and equipment, pain mgt, pre-prosthetic education. Goals are: mod I. 9. OT will assess and treat for/with: ADL's, functional mobility, safety, upper extremity strength, adaptive techniques and equipment, pain mgt, ego support, community rreintegration. Goals are: mod I. Therapy may proceed with showering this patient. 10. SLP will assess and treat for/with: n/a. Goals are: n/a. 11. Case Management and Social Worker will assess and treat for psychological issues and discharge planning. 12. Team conference will be held weekly to assess progress toward goals and to determine barriers to discharge. 13. Patient will receive at least 3 hours of therapy per day at least 5 days per week. 14. ELOS: 7-10 days  15. Prognosis: excellent Meredith Staggers, MD, [facility]  09/21/2016  Cathlyn Parsons., PA-C  09/20/2016

## 2016-09-21 NOTE — Progress Notes (Addendum)
[facility]  Subjective  - Doing relatively well. No new complaints.   Objective 107/60 100 97.9 F (36.6 C) (Oral) 18 97%  Intake/Output Summary (Last 24 hours) at 09/21/16 0730 Last data filed at 09/21/16 0328  Gross per 24 hour  Intake             1220 ml  Output                0 ml  Net             1220 ml    Left AKA incision healing well, minimal edema. SS drainage on dressing from drain site, no active drainage    Assessment/Planning: POD # 3 left AKA  Dry dressing applied Will order [employer] sleeve for AKA  COLLINS, EMMA MAUREEN 09/21/2016 7:30 AM --  Agree with above.  Awaiting Rehab eval Blood loss anemia being treated with transfusion per primary team. This does not appear to be blood loss related to her amputation and is most likely ongoing secondary to her cancer.  Ruta Hinds, MD [facility] Office: [phone] Pager: [phone]  Laboratory Lab Results:  Recent Labs  09/20/16 0243 09/21/16 0316  WBC 18.1* 16.1*  HGB 7.1* 6.6*  HCT 22.3* 20.8*  PLT 63* 74*   BMET  Recent Labs  09/20/16 0243 09/21/16 0316  NA 134* 133*  K 3.0* 3.4*  CL 106 104  CO2 18* 22  GLUCOSE 101* 111*  BUN 37* 28*  CREATININE 0.96 0.82  CALCIUM 7.9* 7.9*    COAG Lab Results  Component Value Date   INR 1.54 09/18/2016   INR 1.47 09/11/2016   INR 1.37 08/30/2016   No results found for: PTT

## 2016-09-21 NOTE — Progress Notes (Signed)
Phone call received from lab, Informing me patient Hgb dropped to 6.6.Text paged Dr Hal Hope. Awaiting response

## 2016-09-21 NOTE — Care Management Note (Signed)
Case Management Note  Patient Details  Name: Jodi Collins MRN: [id number] Date of Birth: 04-13-1950  Subjective/Objective:                 Patient will DC to IP rehab today.    Action/Plan:   Expected Discharge Date:  09/21/16               Expected Discharge Plan:  [place]  In-House Referral:     Discharge planning Services  CM Consult  Post Acute Care Choice:    Choice offered to:     DME Arranged:    DME Agency:     HH Arranged:    HH Agency:     Status of Service:  Completed, signed off  If discussed at [employer], dates discussed:    Additional Comments:  Carles Collet, RN 09/21/2016, 12:13 PM

## 2016-09-21 NOTE — Progress Notes (Signed)
CRITICAL VALUE ALERT  Critical Value:  Hemoglobin 6.6  Date & Time Notied: 09/21/2016 0520   Provider Notified:  Dr Hal Hope  Orders Received/Actions taken: Phone call Received from MD. Will place orders

## 2016-09-21 NOTE — Care Management Note (Signed)
Inpatient Rehabilitation Center Individual Statement of Services  Patient Name:  Jodi Collins  Date:  09/21/2016  Welcome to the [place].  Our goal is to provide you with an individualized program based on your diagnosis and situation, designed to meet your specific needs.  With this comprehensive rehabilitation program, you will be expected to participate in at least 3 hours of rehabilitation therapies Monday-Friday, with modified therapy programming on the weekends.  Your rehabilitation program will include the following services:  Physical Therapy (PT), Occupational Therapy (OT), 24 hour per day rehabilitation nursing, Therapeutic Recreaction (TR), Neuropsychology, Case Management (Social Worker), Rehabilitation Medicine, Nutrition Services and Pharmacy Services  Weekly team conferences will be held on Wednesday to discuss your progress.  Your Social Worker will talk with you frequently to get your input and to update you on team discussions.  Team conferences with you and your family in attendance may also be held.  Expected length of stay: 7-10 days  Overall anticipated outcome: mod/i-supervision with tub transfers  Depending on your progress and recovery, your program may change. Your Social Worker will coordinate services and will keep you informed of any changes. Your Social Worker's name and contact numbers are listed  below.  The following services may also be recommended but are not provided by the [facility] will be made to provide these services after discharge if needed.  Arrangements include referral to agencies that provide these services.  Your insurance has been verified to be:  [facility] Your primary doctor is:  Jodi Collins  Pertinent information will be shared with your doctor and  your insurance company.  Social Worker:  Ovidio Kin, [place] or ([phone]  Information discussed with and copy given to patient by: Elease Hashimoto, 09/21/2016, 3:54 PM

## 2016-09-21 NOTE — Progress Notes (Signed)
Blood transfusion started. Patient education done. Patient informed she will call nurse if she has chest pain , SOB, fever,chill, nausea,hives, shock, back ache and any abnormal symptoms. Patient verbalizes understanding. Monitoring patient for first 15 mins for any reaction

## 2016-09-21 NOTE — Progress Notes (Signed)
1315, report from [facility]. Transferred to room 2

## 2016-09-21 NOTE — Progress Notes (Signed)
[name], MD Physician Signed Physical Medicine and Rehabilitation  Consult Note Date of Service: 09/19/2016 6:30 AM  Related encounter: Admission (Discharged) from 09/18/2016 in [facility] copied text [place] for attribution information   Physical Medicine and Rehabilitation Consult  Reason for Consult: Decreased functional mobility  Referring Physician: Dr. Rockne Menghini  HPI: Jodi Collins is a 66 y.o. right handed female who lives alone and was independent prior to admission with close his family in [place]. 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 Recently seen in the ER with shortness of breath cough left lower extremity swelling found to have right sided segmental and subsegmental emboli started on heparin therapy discontinued after finding anemia possible GI bleed she did receive a transfusion. During that workup CT abdomen and pelvis showed gastric/stomach cancer confirmed by EGD 08/30/2016 she did receive an IVC filter. A referral was made to outpatient oncology services for plans of chemotherapy. On 09/08/2016 patient with increasing left leg pain swelling and rest pain. Noted necrotic changes left lower extremity and no Doppler signal. 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 Ruta Hinds. Hospital course pain management. Latest hemoglobin 7.8 as well as leukocytosis 20,800. Hyperkalemia received Kayexalate. Physical and occupational therapy evaluations are pending. M.D. has requested physical medicine rehabilitation consult.  Review of Systems  Constitutional: Negative for chills and fever.  HENT: Negative for hearing loss.  Eyes: Negative for blurred vision and double vision.  Respiratory: Negative for cough and shortness of breath.  Cardiovascular: Positive for leg swelling. Negative for chest pain and  palpitations.  Gastrointestinal: Positive for melena. Negative for nausea and vomiting.  Genitourinary: Negative for dysuria, flank pain and hematuria.  Musculoskeletal: Positive for myalgias.  Skin: Negative for rash.  Neurological: Positive for weakness. Negative for seizures.  All other systems reviewed and are negative.        Past Medical History:  Diagnosis Date  . Anemia   . DVT (deep venous thrombosis) ([facility]) 08/17/2016  . 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 GI bleed   . Hyperkalemia   . Hyponatremia   . PE (pulmonary thromboembolism) ([facility]) 08/28/2016  . 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 Thrombocytopenia ([facility])          Past Surgical History:  Procedure Laterality Date  . CERVICAL CONIZATION W/BX  2012   high grade squamous intraepithelial dysplasia. Dr Garwin Brothers.   . COLONOSCOPY W/ POLYPECTOMY  10/2006 and 10/2011   Dr Collene Mares. had adenomatous polyps.   . ESOPHAGOGASTRODUODENOSCOPY N/A 08/30/2016   Procedure: ESOPHAGOGASTRODUODENOSCOPY (EGD); Surgeon: Carol Ada, MD; Location: [facility]; Service: Gastroenterology; Laterality: N/A;  . IR FLUORO GUIDE PORT INSERTION RIGHT  09/01/2016  . IR IVC FILTER PLMT / S&I /IMG GUID/MOD SED  08/29/2016  . IR US GUIDE VASC ACCESS RIGHT  09/01/2016           Family History   Problem Relation Age of Onset   . Hypertension Mother    . Diabetes Mother     Social History: reports that she has never smoked. She has never used smokeless tobacco. She reports that she drinks alcohol. She reports that she does not use drugs.  Allergies:  Allergies    Allergen Reactions    . No Known Allergies                 Medications Prior to Admission    Medication Sig Dispense Refill    . oxyCODONE-acetaminophen (PERCOCET/ROXICET) 5-325 MG tablet Take 1-2 tablets by mouth every 4 (four) hours as needed for severe pain. 90 tablet 0    . polyethylene glycol powder (GLYCOLAX/MIRALAX) powder 1 capful daily  as needed (Patient taking differently: Take 17 g by mouth daily. ) 255 g 2    . dexamethasone (DECADRON) 4 MG tablet Take 2 tablets (8 mg total) by mouth daily. Start the day after chemotherapy for 2 days. Take with food. 30 tablet 1    . fluorouracil CALGB 57846 in sodium chloride 0.9 % 150 mL Inject into the vein over 96 hr. Every 2 weeks, wears pump for 46 hours      . leucovorin in dextrose 5 % 250 mL Inject into the vein once. Every 2 weeks      . lidocaine-prilocaine (EMLA) cream Apply to affected area once 30 g 3    . Misc. Devices MISC Wheelchair 1 each 0    . ondansetron (ZOFRAN) 8 MG tablet Take 1 tablet (8 mg total) by mouth 2 (two) times daily as needed for refractory nausea / vomiting. Start on day 3 after chemotherapy. 30 tablet 1    . OXALIPLATIN IV Inject into the vein. Every 2 weeks      . prochlorperazine (COMPAZINE) 10 MG tablet Take 1 tablet (10 mg total) by mouth every 6 (six) hours as needed (Nausea or vomiting). 30 tablet 1     Home:   Functional History:   Functional Status:  Mobility:      ADL:   Cognition:  Cognition  Orientation Level: Oriented X4   Blood pressure 95/60, pulse 91, temperature 97.7 F (36.5 C), temperature source Oral, resp. rate 18, height 5\' 2"  (1.575 m), weight 63.5 kg (140 lb), SpO2 100 %.  Physical Exam  Vitals reviewed.  Constitutional: She is oriented to person, place, and time. She appears well-developed and well-nourished.  HENT:  Head: Normocephalic and atraumatic.  Eyes: EOM are normal. Right eye exhibits no discharge. Left eye exhibits no discharge.  Neck: Normal range of motion. Neck supple. No thyromegaly present.  Cardiovascular: Normal rate, regular rhythm and normal heart sounds.  Respiratory: Effort normal and breath sounds normal. No respiratory distress.  GI: Soft. Bowel sounds are normal. She exhibits no distension.  Musculoskeletal: She exhibits edema and tenderness.  Neurological: She is alert and oriented to  person, place, and time.  Motor: B/l UE 5/5 proximal to distal RLE: 4+/5 proximal to distal LLE: HF 4/5 proximal to distal Sensation intact to light touch  Skin: Skin is warm and dry.  Amputation site is dressed appropriately tender  Psychiatric: She has a normal mood and affect. Her behavior is normal.       Lab Results Last 24 Hours  Revision History                      Routing History

## 2016-09-22 ENCOUNTER — Inpatient Hospital Stay (HOSPITAL_COMMUNITY): Payer: 59 | Admitting: Physical Therapy

## 2016-09-22 ENCOUNTER — Inpatient Hospital Stay (HOSPITAL_COMMUNITY): Payer: 59 | Admitting: Occupational Therapy

## 2016-09-22 DIAGNOSIS — Z86718 Personal history of other venous thrombosis and embolism: Secondary | ICD-10-CM

## 2016-09-22 DIAGNOSIS — D72829 Elevated white blood cell count, unspecified: Secondary | ICD-10-CM

## 2016-09-22 DIAGNOSIS — G8918 Other acute postprocedural pain: Secondary | ICD-10-CM | POA: Insufficient documentation

## 2016-09-22 LAB — GLUCOSE, CAPILLARY
GLUCOSE-CAPILLARY: 105 mg/dL — AB (ref 65–99)
GLUCOSE-CAPILLARY: 88 mg/dL (ref 65–99)
GLUCOSE-CAPILLARY: 112 mg/dL — AB (ref 65–99)
Glucose-Capillary: 82 mg/dL (ref 65–99)

## 2016-09-22 LAB — TYPE AND SCREEN
ABO/RH(D): A POS
ANTIBODY SCREEN: NEGATIVE
UNIT DIVISION: 0
UNIT DIVISION: 0
Unit division: 0
Unit division: 0

## 2016-09-22 LAB — BPAM RBC
BLOOD PRODUCT EXPIRATION DATE: 201807112359
BLOOD PRODUCT EXPIRATION DATE: 201807112359
BLOOD PRODUCT EXPIRATION DATE: 201807132359
Blood Product Expiration Date: 201807132359
ISSUE DATE / TIME: 201807021320
ISSUE DATE / TIME: 201806220919
ISSUE DATE / TIME: 201807021504
ISSUE DATE / TIME: 201807050629
Unit Type and Rh: 6200
Unit Type and Rh: 6200
Unit Type and Rh: 6200
Unit Type and Rh: 6200

## 2016-09-22 LAB — COMPREHENSIVE METABOLIC PANEL
ALT: 29 U/L (ref 14–54)
AST: 44 U/L — AB (ref 15–41)
Albumin: 1.7 g/dL — ABNORMAL LOW (ref 3.5–5.0)
Alkaline Phosphatase: 233 U/L — ABNORMAL HIGH (ref 38–126)
Anion gap: 6 (ref 5–15)
BUN: 22 mg/dL — AB (ref 6–20)
CHLORIDE: 106 mmol/L (ref 101–111)
CO2: 21 mmol/L — AB (ref 22–32)
CREATININE: 0.65 mg/dL (ref 0.44–1.00)
Calcium: 8 mg/dL — ABNORMAL LOW (ref 8.9–10.3)
Glucose, Bld: 91 mg/dL (ref 65–99)
POTASSIUM: 3.8 mmol/L (ref 3.5–5.1)
SODIUM: 133 mmol/L — AB (ref 135–145)
Total Bilirubin: 0.9 mg/dL (ref 0.3–1.2)
Total Protein: 5.6 g/dL — ABNORMAL LOW (ref 6.5–8.1)

## 2016-09-22 LAB — CBC WITH DIFFERENTIAL/PLATELET
BASOS ABS: 0 10*3/uL (ref 0.0–0.1)
Basophils Relative: 0 %
EOS ABS: 0.1 10*3/uL (ref 0.0–0.7)
EOS PCT: 1 %
HCT: 27.5 % — ABNORMAL LOW (ref 36.0–46.0)
Hemoglobin: 8.8 g/dL — ABNORMAL LOW (ref 12.0–15.0)
Lymphocytes Relative: 6 %
Lymphs Abs: 0.9 10*3/uL (ref 0.7–4.0)
MCH: 27.8 pg (ref 26.0–34.0)
MCHC: 32 g/dL (ref 30.0–36.0)
MCV: 86.8 fL (ref 78.0–100.0)
MONO ABS: 0.4 10*3/uL (ref 0.1–1.0)
Monocytes Relative: 3 %
Neutro Abs: 13.8 10*3/uL — ABNORMAL HIGH (ref 1.7–7.7)
Neutrophils Relative %: 90 %
PLATELETS: 78 10*3/uL — AB (ref 150–400)
RBC: 3.17 MIL/uL — AB (ref 3.87–5.11)
RDW: 17.6 % — AB (ref 11.5–15.5)
WBC: 15.3 10*3/uL — AB (ref 4.0–10.5)

## 2016-09-22 NOTE — Progress Notes (Signed)
Social Work Assessment and Plan Social Work Assessment and Plan  Patient Details  Name: Jodi Collins MRN: [id number] Date of Birth: 1950-06-16  Today's Date: 09/22/2016  Problem List:  Patient Active Problem List   Diagnosis Date Noted  . History of DVT (deep vein thrombosis)   . Post-operative pain   . Above knee amputation status, left ([place]) 09/21/2016  . Acute blood loss anemia   . Thrombocytopenia ([place])   . Stage 3 chronic kidney disease   . Acute kidney injury ([place]) 09/18/2016  . S/P AKA (above knee amputation) unilateral, left ([place]) 09/18/2016  . Leukocytosis 09/18/2016  . Hyponatremia   . GI bleed   . Post-op pain   . Sepsis ([place]) 09/08/2016  . Left leg DVT ([place]) 09/08/2016  . Hyperkalemia 09/08/2016  . Acute renal failure (ARF) ([place]) 09/08/2016  . 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 Pulmonary emboli ([place]) 08/28/2016  . Anemia 08/28/2016   Past Medical History:  Past Medical History:  Diagnosis Date  . Anemia   . DVT (deep venous thrombosis) ([place]) 08/17/2016  . 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 GI bleed   . Hyperkalemia   . Hyponatremia   . PE (pulmonary thromboembolism) ([place]) 08/28/2016  . 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 Thrombocytopenia ([place])    Past Surgical History:  Past Surgical History:  Procedure Laterality Date  . AMPUTATION Left 09/18/2016   Procedure: AMPUTATION ABOVE KNEE-LEFT;  Surgeon: Elam Dutch, MD;  Location: [facility] OR;  Service: Vascular;  Laterality: Left;  . CERVICAL CONIZATION W/BX  2012   high grade squamous intraepithelial dysplasia.  Dr Garwin Brothers.   . COLONOSCOPY W/ POLYPECTOMY  10/2006 and 10/2011   Dr Collene Mares.  had adenomatous polyps.    . ESOPHAGOGASTRODUODENOSCOPY N/A 08/30/2016   Procedure: ESOPHAGOGASTRODUODENOSCOPY (EGD);  Surgeon: Carol Ada, MD;  Location: [facility];  Service: Gastroenterology;  Laterality: N/A;  . IR FLUORO GUIDE PORT INSERTION RIGHT  09/01/2016  . IR IVC  FILTER PLMT / S&I /IMG GUID/MOD SED  08/29/2016  . IR US GUIDE VASC ACCESS RIGHT  09/01/2016   Social History:  reports that she has never smoked. She has never used smokeless tobacco. She reports that she drinks alcohol. She reports that she does not use drugs.  Family / Support Systems Marital Status: Separated Patient Roles: Other (Comment) (employee & sibling) Other Supports: Andrea-sister [phone]  Sister in [place] was here and has since gone back Has Mom and Aunt in [place] Anticipated Caregiver: Sister may come back from [place]-will confirm this Ability/Limitations of Caregiver: Sister may take FMLA Caregiver Availability: Other (Comment) (Coming up with a plan) Family Dynamics: Close knit with family members-Mom, sister's and Aunt. All have been here and recently left to go back home. Sister from [place] to come back when pt goes home to assist with the transition.  Social History Preferred language: English Religion: Pentecostal Cultural Background: No issues Education: Secretary/administrator educated Read: Yes Write: Yes Employment Status: Employed Name of Employer: Herbalist -fulltime Return to Work Plans: Hopes to return to work, aware will need to get stronger and heal Freight forwarder Issues: Currently separted from her spouse Guardian/Conservator: None-according to MD pt is capable of making her own decisions while here. Will encourage her to think about HCPOA and POA to get in place   Abuse/Neglect Physical Abuse: Denies Verbal Abuse: Denies Sexual Abuse: Denies Exploitation of patient/patient's resources: Denies Self-Neglect: Denies  Emotional Status Pt's affect, behavior adn adjustment status: Pt is tired from therapies, but a good tired. She has always been independent and taken care of herself. All of this has thrown her for a loop, one thing after another. She hopes it will calm down and she can focus on her recovery. Recent Psychosocial Issues: multiple  health issues now, all new and recent for her Pyschiatric History: No history deferred depression screen due to as this time exhausted from therapies. Do feels she would benefit from seeing neuro-psych while here with all she has been dealing with since June. Will make referral. Substance Abuse History: No issues  Patient / Family Perceptions, Expectations & Goals Pt/Family understanding of illness & functional limitations: Pt is able to explain her health issues and she wa to being chemo for her cancer but had to be postphoned due to other health issues. She does talk with her MD's and feels she has a basic understanding. Does not know her prognosis and will be talking with onocologist Premorbid pt/family roles/activities: Sister, Daughter, niece, employee, freind, etc Anticipated changes in roles/activities/participation: resume Pt/family expectations/goals: Pt states: " I want to do well here and get back home and start the chemo treatments." " I have never been one to shy away from a challenge."  [employer]: None Premorbid Home Care/DME Agencies: Other (Comment) ([facility] & [facility] from last admission) Transportation available at discharge: family and freinds Resource referrals recommended: Neuropsychology, Support group (specify)  Discharge Planning Living Arrangements: Alone Support Systems: Parent, Other relatives, Friends/neighbors, Social worker community Type of Residence: Private residence Insurance Resources: Multimedia programmer (specify) Sports administrator) Financial Resources: Employment Financial Screen Referred: No Living Expenses: Education officer, community Management: Patient Does the patient have any problems obtaining your medications?: No Home Management: Self but will have help upon discharge for this Patient/Family Preliminary Plans: Return home with sister coming back from [place] to help with the transition back home. Family has returned home and then will come back closer to  discharge. Aware therapy team setting goals and LOS. Pt glad to be here and be doing therapies to get back to mod/i and home eventually. Social Work Anticipated Follow Up Needs: HH/OP, Support Group  Clinical Impression Pleasant unfortunate female who has recently had multiple health issues-cancer diagnosis, PE/DVT and now the loss of her leg. Her family is supportive but live out of state. Her sister is planning on coming back to assist her When she goes home from [place]. Feel pt would benefit from seeing neuro-psych while here, will make referral. Pt is doing relatively well with all she has had to endure in the past month. Will continue to provide support and assist with discharge planning.  Elease Hashimoto 09/22/2016, 11:36 AM

## 2016-09-22 NOTE — Evaluation (Signed)
Physical Therapy Assessment and Plan  Patient Details  Name: Jodi Collins MRN: [id number] Date of Birth: [birth year]  PT Diagnosis: Difficulty walking and Muscle weakness Rehab Potential: Excellent ELOS: 7 to 10 days   Today's Date: 09/22/2016 PT Individual Time: 0998-3382 PT Individual Time Calculation (min): 73 min    Problem List:  Patient Active Problem List   Diagnosis Date Noted  . History of DVT (deep vein thrombosis)   . Post-operative pain   . Above knee amputation status, left ([place]) 09/21/2016  . Acute blood loss anemia   . Thrombocytopenia ([place])   . Stage 3 chronic kidney disease   . Acute kidney injury ([place]) 09/18/2016  . S/P AKA (above knee amputation) unilateral, left ([place]) 09/18/2016  . Leukocytosis 09/18/2016  . Hyponatremia   . GI bleed   . Post-op pain   . Sepsis ([place]) 09/08/2016  . Left leg DVT ([place]) 09/08/2016  . Hyperkalemia 09/08/2016  . Acute renal failure (ARF) ([place]) 09/08/2016  . 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 Pulmonary emboli ([place]) 08/28/2016  . Anemia 08/28/2016    Past Medical History:  Past Medical History:  Diagnosis Date  . Anemia   . DVT (deep venous thrombosis) ([place]) 08/17/2016  . 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 GI bleed   . Hyperkalemia   . Hyponatremia   . PE (pulmonary thromboembolism) ([place]) 08/28/2016  . 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 Thrombocytopenia ([place])    Past Surgical History:  Past Surgical History:  Procedure Laterality Date  . AMPUTATION Left 09/18/2016   Procedure: AMPUTATION ABOVE KNEE-LEFT;  Surgeon: Elam Dutch, MD;  Location: [facility] OR;  Service: Vascular;  Laterality: Left;  . CERVICAL CONIZATION W/BX  2012   high grade squamous intraepithelial dysplasia.  Dr Garwin Brothers.   . COLONOSCOPY W/ POLYPECTOMY  10/2006 and 10/2011   Dr Collene Mares.  had adenomatous polyps.    . ESOPHAGOGASTRODUODENOSCOPY N/A 08/30/2016   Procedure: ESOPHAGOGASTRODUODENOSCOPY (EGD);   Surgeon: Carol Ada, MD;  Location: [facility];  Service: Gastroenterology;  Laterality: N/A;  . IR FLUORO GUIDE PORT INSERTION RIGHT  09/01/2016  . IR IVC FILTER PLMT / S&I /IMG GUID/MOD SED  08/29/2016  . IR US GUIDE VASC ACCESS RIGHT  09/01/2016    Assessment & Plan Clinical Impression: Patient is a 66 y.o. year old female with recent admission to the hospital on September 18, 2016.  Prior to admission, she had history of DVT /pulmonary emboli May 2018 placed on Xarelto but discontinued due to melena. Recently seen in the ER with shortness of breath cough left lower extremity swelling found to have right sided segmental and subsegmental emboli started on heparin therapy discontinued after finding anemia possible GI bleed she did receive a transfusion. During that workup CT abdomen and pelvis showed gastric/stomach cancer confirmed by EGD 08/30/2016 she did receive an IVC filter. A referral was made to outpatient oncology services for plans of chemotherapy. On 09/08/2016 patient with increasing left leg pain swelling and rest pain. Noted necrotic changes left lower extremity and no Doppler signal. 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 Ruta Hinds. Hospital course pain management. Latest hemoglobin 6.6 and orders for transfusion. Hyperkalemia received [place]. Physical and occupational therapy evaluations completed with recommendations of physical medicine rehabilitation consult. Patient was admitted for a comprehensive rehabilitation program  .  Patient transferred to CIR on 09/21/2016 .   Patient currently requires mod with mobility secondary to muscle  weakness, decreased cardiorespiratoy endurance and decreased standing balance and decreased balance strategies.  Prior to hospitalization, patient was independent  with mobility and lived with Alone in a House home.  Home access is  (Can use only one step into house through front; 2 steps in from garage do not have rail)Stairs to enter.  Patient will  benefit from skilled PT intervention to maximize safe functional mobility, minimize fall risk and decrease caregiver burden for planned discharge home with intermittent assist.  Anticipate patient will benefit from follow up [facility] at discharge.  PT - End of Session Activity Tolerance: Tolerates 10 - 20 min activity with multiple rests Endurance Deficit: Yes PT Assessment Rehab Potential (ACUTE/IP ONLY): Excellent Barriers to Discharge: Inaccessible home environment;Decreased caregiver support Barriers to Discharge Comments:  (Lives alone, stairs to enter, no rails) PT Patient demonstrates impairments in the following area(s): Balance;Endurance;Pain;Safety PT Transfers Functional Problem(s): Bed Mobility;Bed to Chair;Car;Furniture PT Locomotion Functional Problem(s): Ambulation;Wheelchair Mobility;Stairs PT Plan PT Intensity: Minimum of 1-2 x/day ,45 to 90 minutes PT Frequency: Total of 15 hours over 7 days of combined therapies PT Duration Estimated Length of Stay: 7 to 10 days PT Treatment/Interventions: Ambulation/gait training;Balance/vestibular training;Community reintegration;Discharge planning;Disease management/prevention;DME/adaptive equipment instruction;Functional mobility training;Neuromuscular re-education;Pain management;Patient/family education;Psychosocial support;Stair training;Therapeutic Activities;Therapeutic Exercise;UE/LE Strength taining/ROM;UE/LE Coordination activities;Wheelchair propulsion/positioning PT Transfers Anticipated Outcome(s):  (mod I) PT Locomotion Anticipated Outcome(s): Mod I ambulation; min A stairs PT Recommendation Recommendations for Other Services: Therapeutic Recreation consult Therapeutic Recreation Interventions: Clinical cytogeneticist;Outing/community reintergration Follow Up Recommendations: Home health PT Patient destination: Home Equipment Recommended: To be determined;Rolling walker with 5" wheels  Skilled Therapeutic  Intervention Session1: [phone]:   Session focused on improving functional mobility safety and endurance for functional activities to promote independence for D/C to home environment.  These issues addressed by working on w/c mobility, very short distance ambulation, and transfers.  Pt requires frequent rest breaks due to fatigue.  Pt denied pain at initiation of session.  BP: 100/63.  HR: 96 bpm; Sp02: 100% and remained greater than 92%.  Session 2:  [id number]:  Pt reports 5/10 pain in L leg.  Nursing issued meds.  Session initiated with pt seated up in chair, reporting that she needs to be returned to bed due to being extremely cold.  Pt returned to bed via ambulation x 10 ft with min assist for transfer and gait with RW.  Upon returning to bed, pt noted to be short of breath.  HR: 104 and Sp02: 100%.  Session held and PA-C and nursing notified of pt c/o.  Pt returned to bed with call bell in reach, 3 rails up, and bed alarm set.  PT Evaluation Precautions/Restrictions Precautions Precautions: Fall Precaution Comments: L AKA, monitor O2 and HR. Restrictions Weight Bearing Restrictions: Yes LLE Weight Bearing: Non weight bearing General Chart Reviewed: Yes Additional Pertinent History: GI bleed and stomach cancer.   Response to Previous Treatment: Patient reporting fatigue but able to participate. Family/Caregiver Present: No Vital Signs  Pain Pain Assessment Pain Assessment: No/denies pain Pain Score: 0-No pain Faces Pain Scale: No hurt Home Living/Prior Functioning Home Living Living Arrangements: Alone Available Help at Discharge: Friend(s);Family;Available PRN/intermittently Type of Home: House Home Access: Stairs to enter [employer] of Steps:  (Can use only one step into house through front; 2 steps in from garage do not have rail) Home Layout: Able to live on main level with bedroom/bathroom;Two level  Lives With: Alone Prior Function Level of Independence:  Independent with basic ADLs;Independent with homemaking with  ambulation;Independent with gait;Independent with transfers  Able to Take Stairs?: Yes Driving: Yes Vocation: Full time employment Comments:  (worked as Herbalist) Vision/Perception  Geologist, engineering: Within [place]: Intact  Cognition Overall Cognitive Status: Within Functional Limits for tasks assessed Orientation Level: Oriented X4;Oriented to person;Oriented to place;Oriented to time;Oriented to situation Memory: Appears intact Awareness: Appears intact Problem Solving: Appears intact Safety/Judgment: Appears intact Sensation Sensation Light Touch: Appears Intact Coordination Gross Motor Movements are Fluid and Coordinated: No Coordination and Movement Description:  (Difficulty maneuvering L LE for mobility) Motor  Motor Motor: Within Functional Limits       Trunk/Postural Assessment  Cervical Assessment Cervical Assessment: Exceptions to [facility] (Increased forward head/rounded shoulder posture) Thoracic Assessment Thoracic Assessment: Exceptions to [facility] Postural Control Postural Control: Deficits on evaluation Righting Reactions: Impaired due to L AKA  Balance Balance Balance Assessed: Yes Static Sitting Balance Static Sitting - Balance Support: No upper extremity supported Dynamic Sitting Balance Dynamic Sitting - Balance Support: No upper extremity supported Static Standing Balance Static Standing - Balance Support: Bilateral upper extremity supported Extremity Assessment  RUE Assessment RUE Assessment: Within Functional Limits LUE Assessment LUE Assessment: Within Functional Limits RLE Assessment RLE Assessment: Exceptions to [facility] (Pt requires assist with R LE into bed for transfers due to weakness) LLE Assessment LLE Assessment: Exceptions to [facility] LLE Strength LLE Overall Strength Comments: L AKA, NWB, strength not formally tested.  Pt able to left L LE against  gravity to transfer into and out of bed.   See Function Navigator for Current Functional Status.   Refer to Care Plan for Long Term Goals  Recommendations for other services: Therapeutic Recreation  Kitchen group, Stress management and Outing/community reintegration  Discharge Criteria: Patient will be discharged from PT if patient refuses treatment 3 consecutive times without medical reason, if treatment goals not met, if there is a change in medical status, if patient makes no progress towards goals or if patient is discharged from hospital.  The above assessment, treatment plan, treatment alternatives and goals were discussed and mutually agreed upon: by patient  Coy Vandoren Hilario Quarry 09/22/2016, 12:24 PM

## 2016-09-22 NOTE — Progress Notes (Signed)
Patient information reviewed and entered into eRehab system by Valborg Friar, RN, CRRN, PPS Coordinator.  Information including medical coding and functional independence measure will be reviewed and updated through discharge.    

## 2016-09-22 NOTE — Progress Notes (Signed)
[facility] PHYSICAL MEDICINE & REHABILITATION     PROGRESS NOTE  Subjective/Complaints:  Pt seen laying in bed this AM.  She slept well overnight.  She is ready to begin therapies, but slightly nervous.  ROS: Denies CP, SOB, N/V/D.  Objective: Vital Signs: Blood pressure 99/69, pulse 95, temperature 98 F (36.7 C), temperature source Oral, resp. rate 18, SpO2 98 %. No results found.  Recent Labs  09/20/16 0243 09/21/16 0316  WBC 18.1* 16.1*  HGB 7.1* 6.6*  HCT 22.3* 20.8*  PLT 63* 74*    Recent Labs  09/20/16 0243 09/21/16 0316  NA 134* 133*  K 3.0* 3.4*  CL 106 104  GLUCOSE 101* 111*  BUN 37* 28*  CREATININE 0.96 0.82  CALCIUM 7.9* 7.9*   CBG (last 3)   Recent Labs  09/21/16 1654 09/21/16 2052 09/22/16 0607  GLUCAP 91 114* 105*    Wt Readings from Last 3 Encounters:  09/18/16 63.5 kg (140 lb)  09/12/16 63.5 kg (140 lb)  09/11/16 63.5 kg (140 lb)    Physical Exam:  BP 99/69 (BP Location: Left Arm)   Pulse 95   Temp 98 F (36.7 C) (Oral)   Resp 18   SpO2 98%  Constitutional: She appears well-developed. NAD. HENT: Normocephalic. Atraumatic Eyes: EOM are normal. No discharge.  Cardiovascular: Normal rate and regular rhythm. No JVD.  Respiratory: Effort normal and breath sounds normal.  GI: Soft. Bowel sounds are normal.  Neurological: She is alert and oriented.  Motor: B/L UE 5/5 proximal to distal RLE: 4+/5 proximal to distal LLE: 4/5 HF Sensation intact to light touch in all 4's. Cognitively intact.  Psychiatric: Pleasant and cooperative. Normal mood and behavior Skin. Amputation site dressed appropriately tender/ACE in place, c/d/i.   Assessment/Plan: 1. 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 Physiatrist is providing close team supervision and 24 hour management of active medical problems listed below. Physiatrist and rehab team  continue to assess barriers to discharge/monitor patient progress toward functional and medical goals.  Function:  Bathing Bathing position      Bathing parts      Bathing assist        Upper Body Dressing/Undressing Upper body dressing                    Upper body assist        Lower Body Dressing/Undressing Lower body dressing                                  Lower body assist        Toileting Toileting Toileting activity did not occur: N/A   Toileting steps completed by helper: Performs perineal hygiene Toileting Assistive Devices: Toilet aid, Other (comment)  Toileting assist Assist level: Two helpers   Transfers Chair/bed transfer Chair/bed transfer activity did not occur: Safety/medical concerns           Manufacturing systems engineer          Cognition Comprehension Comprehension assist level: Follows complex conversation/direction with no assist  Expression Expression assist level: Expresses complex ideas: With no assist  Social Interaction Social Interaction assist level: Interacts appropriately with others - No medications needed.  Problem Solving Problem solving assist level: Solves complex problems: Recognizes & self-corrects  Memory  Medical Problem List and Plan:  1. Decreased functional mobility secondary to left AKA 09/18/2016   Begin CIR 2. DVT Prophylaxis/Anticoagulation: SCD RLE. 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 Patient does have an IVC filter.  3. Pain Management: Hydrocodone as needed  4. Mood: Provide emotional support  5. Neuropsych: This patient is capable of making decisions on her own behalf.  6. Skin/Wound Care: Routine skin checks  7. Fluids/Electrolytes/Nutrition: Routine I&Os  Labs pending this AM 8. Acute blood loss anemia.   Labs pending this AM  Cont iron supplementation  9. Hyperkalemia. Resolved.  10. History of  gastric/stomach cancer. Follow-up outpatient Dr. Mila Merry  11. Leukocytosis  Labs pending this AM  LOS (Days) 1 A FACE TO FACE EVALUATION WAS PERFORMED  Joci Dress Lorie Phenix 09/22/2016 8:01 AM

## 2016-09-22 NOTE — Evaluation (Addendum)
Occupational Therapy Assessment and Plan  Patient Details  Name: Jodi Collins MRN: [id number] Date of Birth: 1951/01/29  OT Diagnosis: acute pain and muscle weakness (generalized) Rehab Potential: Rehab Potential (ACUTE ONLY): Good ELOS: 8-10 days   Today's Date: 09/22/2016 OT Individual Time: 0800-0901 OT Individual Time Calculation (min): 61 min     Problem List:  Patient Active Problem List   Diagnosis Date Noted  . History of DVT (deep vein thrombosis)   . Post-operative pain   . Above knee amputation status, left ([place]) 09/21/2016  . Acute blood loss anemia   . Thrombocytopenia ([place])   . Stage 3 chronic kidney disease   . Acute kidney injury ([place]) 09/18/2016  . S/P AKA (above knee amputation) unilateral, left ([place]) 09/18/2016  . Leukocytosis 09/18/2016  . Hyponatremia   . GI bleed   . Post-op pain   . Sepsis ([place]) 09/08/2016  . Left leg DVT ([place]) 09/08/2016  . Hyperkalemia 09/08/2016  . Acute renal failure (ARF) ([place]) 09/08/2016  . 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 Pulmonary emboli ([place]) 08/28/2016  . Anemia 08/28/2016    Past Medical History:  Past Medical History:  Diagnosis Date  . Anemia   . DVT (deep venous thrombosis) ([place]) 08/17/2016  . 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 GI bleed   . Hyperkalemia   . Hyponatremia   . PE (pulmonary thromboembolism) ([place]) 08/28/2016  . 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 Thrombocytopenia ([place])    Past Surgical History:  Past Surgical History:  Procedure Laterality Date  . AMPUTATION Left 09/18/2016   Procedure: AMPUTATION ABOVE KNEE-LEFT;  Surgeon: Elam Dutch, MD;  Location: [facility] OR;  Service: Vascular;  Laterality: Left;  . CERVICAL CONIZATION W/BX  2012   high grade squamous intraepithelial dysplasia.  Dr Garwin Brothers.   . COLONOSCOPY W/ POLYPECTOMY  10/2006 and 10/2011   Dr Collene Mares.  had adenomatous polyps.    . ESOPHAGOGASTRODUODENOSCOPY N/A 08/30/2016   Procedure:  ESOPHAGOGASTRODUODENOSCOPY (EGD);  Surgeon: Carol Ada, MD;  Location: [place];  Service: Gastroenterology;  Laterality: N/A;  . IR FLUORO GUIDE PORT INSERTION RIGHT  09/01/2016  . IR IVC FILTER PLMT / S&I /IMG GUID/MOD SED  08/29/2016  . IR US GUIDE VASC ACCESS RIGHT  09/01/2016    Assessment & Plan Clinical Impression: Patient is a 66 y.o. year old female with recent admission to the hospital on 09/08/2016 patient with increasing left leg pain swelling and rest pain. Noted necrotic changes left lower extremity and no Doppler signal. 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 Ruta Hinds. Hospital course pain management. Latest hemoglobin 6.6 and orders for transfusion.  Patient transferred to CIR on 09/21/2016 .    Patient currently requires mod with basic self-care skills secondary to muscle weakness and decreased sitting balance, decreased standing balance and decreased balance strategies.  Prior to hospitalization, patient could complete ADLs with independent .  Patient will benefit from skilled intervention to decrease level of assist with basic self-care skills and increase independence with basic self-care skills prior to discharge home with care partner.  Anticipate patient will require intermittent supervision and follow up home health.  OT - End of Session Activity Tolerance: Tolerates < 10 min activity with changes in vital signs Endurance Deficit: Yes OT Assessment Rehab Potential (ACUTE ONLY): Good Barriers to Discharge: Decreased caregiver support Barriers to Discharge Comments: Pt lives alone but will have family come help at discharge for  a short period of time.  OT Patient demonstrates impairments in the following area(s): Balance;Pain;Endurance OT Basic ADL's Functional Problem(s): Grooming;Bathing;Dressing OT Advanced ADL's Functional Problem(s): Simple Meal Preparation;Light Housekeeping OT Transfers Functional Problem(s): Toilet;Tub/Shower OT Additional Impairment(s):  None OT Plan OT Intensity: Minimum of 1-2 x/day, 45 to 90 minutes OT Frequency: 5 out of 7 days OT Duration/Estimated Length of Stay: 8-10 days OT Treatment/Interventions: Balance/vestibular training;Community reintegration;Discharge planning;Pain management;Functional mobility training;DME/adaptive equipment instruction;Wheelchair propulsion/positioning;Therapeutic Exercise;Patient/family education;Self Care/advanced ADL retraining;Therapeutic Activities OT Self Feeding Anticipated Outcome(s): Independent OT Basic Self-Care Anticipated Outcome(s): Modified independent OT Toileting Anticipated Outcome(s): Modified independent OT Bathroom Transfers Anticipated Outcome(s): Modified independent OT Recommendation Patient destination: Home Follow Up Recommendations: Home health OT;24 hour supervision/assistance Equipment Recommended: To be determined   Skilled Therapeutic Intervention Began education on selfcare retraining sitting EOB.  Pt needing min assist with mod demonstrational cueing for sequencing supine to sit by rolling to her side and then pushing up off of the bed to transition to sitting.  Increased anxiety and fear noted when pt was asked to attempt to reach down to wash her right foot.  She was not able to flex it up to wash or donn gripper sock either, secondary to increased edema in the RLE.  Mod assist for squat pivot transfer to wheelchair with mod assist for further sit to stand and transfers using the RW.  Dyspnea noted at 2/4 during ADL but Oxygen sats 96% and HR 100 BPM during activity.  Pt left in wheelchair at end of session with call button in reach.    OT Evaluation Precautions/Restrictions  Precautions Precautions: Fall Precaution Comments: L AKA, monitor O2 and HR, low HGB Restrictions Weight Bearing Restrictions: Yes LLE Weight Bearing: Non weight bearing  Pain  Minimal pain reported in LLE  Home Living/Prior Functioning Home Living Living Arrangements:  Alone Available Help at Discharge: Friend(s), Family, Available PRN/intermittently Type of Home: House Home Access: Stairs to enter [employer] of Steps:  (Can use only one step into house through front; 2 steps in from garage do not have rail) Entrance Stairs-Rails: None Home Layout: Able to live on main level with bedroom/bathroom, Two level Bathroom Shower/Tub: Multimedia programmer: Standard Bathroom Accessibility: Yes  Lives With: Alone IADL History Homemaking Responsibilities: Yes Meal Prep Responsibility: Primary Laundry Responsibility: Primary Cleaning Responsibility: Primary Bill Paying/Finance Responsibility: Primary Shopping Responsibility: Primary Current License: Yes Mode of Transportation: Car Occupation: Full time employment Prior Function Level of Independence: Independent with basic ADLs, Independent with homemaking with ambulation, Independent with gait, Independent with transfers  Able to Take Stairs?: Yes Driving: Yes 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  See Function Section of chart for details  Vision Baseline Vision/History: Wears glasses Wears Glasses: At all times Patient Visual Report: No change from baseline Vision Assessment?: No apparent visual deficits Perception  Perception: Within Functional Limits Praxis Praxis: Intact Cognition Overall Cognitive Status: Within Functional Limits for tasks assessed Arousal/Alertness: Awake/alert Orientation Level: Person;Place;Situation Person: Oriented Place: Oriented Situation: Oriented Year: 2018 Month: July Day of Week: Incorrect (stated Thursday) Memory: Impaired Memory Impairment: Decreased recall of new information Immediate Memory Recall: Sock;Blue;Bed Memory Recall: Sock;Blue Memory Recall Sock: With Cue Memory Recall Blue: With Cue Attention: Sustained Sustained Attention: Appears intact Awareness: Appears intact Problem  Solving: Appears intact Safety/Judgment: Appears intact Sensation Sensation Light Touch: Appears Intact Stereognosis: Appears Intact Hot/Cold: Appears Intact Proprioception: Appears Intact Coordination Gross Motor Movements are Fluid and Coordinated: Yes Fine Motor Movements are  Fluid and Coordinated: Yes Coordination and Movement Description: BUE coordination intact during ADL session. Motor  Motor Motor: Within Functional Limits Motor - Skilled Clinical Observations: Generalized weakness and fatigue noted with activity. Mobility  Transfers Transfers: Sit to Stand;Stand to Sit Sit to Stand: 3: Mod assist;With armrests;With upper extremity assist Stand to Sit: 3: Mod assist;With armrests;With upper extremity assist;To chair/3-in-1  Trunk/Postural Assessment  Cervical Assessment Cervical Assessment: Exceptions to [facility] Thoracic Assessment Thoracic Assessment: Exceptions to [facility] Lumbar Assessment Lumbar Assessment: Within Functional Limits Postural Control Postural Control: Deficits on evaluation Righting Reactions: Impaired due to L AKA  Balance Balance Balance Assessed: Yes Static Sitting Balance Static Sitting - Balance Support: No upper extremity supported Static Sitting - Level of Assistance: 6: Modified independent (Device/Increase time) Dynamic Sitting Balance Dynamic Sitting - Balance Support: During functional activity Dynamic Sitting - Level of Assistance: 4: Min Insurance risk surveyor Standing - Balance Support: During functional activity Static Standing - Level of Assistance: 4: Min assist Dynamic Standing Balance Dynamic Standing - Balance Support: During functional activity Dynamic Standing - Level of Assistance: 3: Mod assist Extremity/Trunk Assessment RUE Assessment RUE Assessment: Exceptions to WFL (AROM WFLS for all joints, generalized weakness noted in shoulders 3+/5.  All other joints 4/5 ) LUE Assessment LUE Assessment: Exceptions to  [facility] (AROM WFLS for all joints, generalized weakness noted in shoulders 3+/5.  All other joints 4/5)   See Function Navigator for Current Functional Status.   Refer to Care Plan for Long Term Goals  Recommendations for other services: Neuropsych   Discharge Criteria: Patient will be discharged from OT if patient refuses treatment 3 consecutive times without medical reason, if treatment goals not met, if there is a change in medical status, if patient makes no progress towards goals or if patient is discharged from hospital.  The above assessment, treatment plan, treatment alternatives and goals were discussed and mutually agreed upon: by patient  Dejanay Wamboldt OTR/L 09/22/2016, 4:15 PM

## 2016-09-22 NOTE — Progress Notes (Signed)
Occupational Therapy Session Note  Patient Details  Name: Man Bonneau MRN: [id number] Date of Birth: Mar 20, 1951  Today's Date: 09/22/2016 OT Individual Time: 1332-1415 OT Individual Time Calculation (min): 43 min    Short Term Goals: Week 1:  OT Short Term Goal 1 (Week 1): STGs equal to LTGs set at modified independent overall.   Skilled Therapeutic Interventions/Progress Updates:  Began session by working on re-wrapping her LLE with gauze and applying new shrinker sock secondary to increased drainage soaking through her ace bandage and onto the sheets of her bed.  She was able to complete transfer from supine to sit on the EOB with min assist and work on threading new pants.  Mod assist for threading pants over the right foot with mod assist to complete sit to stand and pull them over her hips.  Mod assist for stand pivot transfer from bed to wheelchair, with mod demonstrational cueing for hand placement prior to sit to stand.  She was able to perform static standing with use of the RW with min assist.  Focused on releasing each UE in standing to pick up object from her bedside table.  She was only able to tolerate standing for 1 minute before needing to sit and rest.  Pt left in wheelchair with call button and phone in reach and safety belt in place.     Therapy Documentation Precautions:  Precautions Precautions: Fall Precaution Comments: L AKA, monitor O2 and HR, low HGB Restrictions Weight Bearing Restrictions: Yes LLE Weight Bearing: Non weight bearing  Pain: Pain Assessment Pain Assessment: Faces Faces Pain Scale: Hurts a little bit Pain Type: Surgical pain Pain Location: Leg Pain Orientation: Left Pain Descriptors / Indicators: Discomfort Pain Onset: With Activity Pain Intervention(s): Repositioned ADL:  See Function Navigator for Current Functional Status.   Therapy/Group: Individual Therapy  Olive Zmuda OTR/L 09/22/2016, 4:16 PM

## 2016-09-23 ENCOUNTER — Inpatient Hospital Stay (HOSPITAL_COMMUNITY): Payer: 59 | Admitting: Occupational Therapy

## 2016-09-23 ENCOUNTER — Inpatient Hospital Stay (HOSPITAL_COMMUNITY): Payer: 59 | Admitting: Physical Therapy

## 2016-09-23 LAB — GLUCOSE, CAPILLARY
GLUCOSE-CAPILLARY: 92 mg/dL (ref 65–99)
GLUCOSE-CAPILLARY: 75 mg/dL (ref 65–99)
GLUCOSE-CAPILLARY: 82 mg/dL (ref 65–99)
GLUCOSE-CAPILLARY: 97 mg/dL (ref 65–99)

## 2016-09-23 NOTE — Progress Notes (Signed)
[place] PHYSICAL MEDICINE & REHABILITATION     PROGRESS NOTE  Subjective/Complaints:  Happy with progress in therapy. Pain controlled.   ROS: pt denies nausea, vomiting, diarrhea, cough, shortness of breath or chest pain   Objective: Vital Signs: Blood pressure 110/66, pulse 97, temperature 98.3 F (36.8 C), temperature source Oral, resp. rate 18, SpO2 97 %. No results found.  Recent Labs  09/21/16 0316 09/22/16 0646  WBC 16.1* 15.3*  HGB 6.6* 8.8*  HCT 20.8* 27.5*  PLT 74* 78*    Recent Labs  09/21/16 0316 09/22/16 0646  NA 133* 133*  K 3.4* 3.8  CL 104 106  GLUCOSE 111* 91  BUN 28* 22*  CREATININE 0.82 0.65  CALCIUM 7.9* 8.0*   CBG (last 3)   Recent Labs  09/22/16 1631 09/22/16 2043 09/23/16 0630  GLUCAP 88 112* 92    Wt Readings from Last 3 Encounters:  09/18/16 63.5 kg (140 lb)  09/12/16 63.5 kg (140 lb)  09/11/16 63.5 kg (140 lb)    Physical Exam:  BP 110/66 (BP Location: Left Arm)   Pulse 97   Temp 98.3 F (36.8 C) (Oral)   Resp 18   SpO2 97%  Constitutional: She appears well-developed. NAD. HENT: Normocephalic. Atraumatic Eyes: EOM are normal. No discharge.  Cardiovascular: RRR without murmur. No JVD .  Respiratory: CTA Bilaterally without wheezes or rales. Normal effort   GI: Soft. Bowel sounds are normal.  Neurological: She is alert and oriented.  Motor: B/L UE 5/5 proximal to distal RLE: 4+/5 proximal to distal LLE: 4/5 HF Sensation intact to light touch in all 4's. Cognitively intact.  Psychiatric: Pleasant and cooperative. Normal mood and behavior Skin. Amputation site dressed appropriately tender/ACE in place, c/d/i.   Assessment/Plan: 1. 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 Physiatrist is providing close team supervision and 24 hour management of active medical problems listed below. Physiatrist and rehab team  continue to assess barriers to discharge/monitor patient progress toward functional and medical goals.  Function:  Bathing Bathing position   Position: Sitting EOB  Bathing parts Body parts bathed by patient: Right arm, Left arm, Chest, Abdomen, Right upper leg, Left upper leg, Front perineal area Body parts bathed by helper: Right lower leg, Buttocks, Back  Bathing assist        Upper Body Dressing/Undressing Upper body dressing   What is the patient wearing?: Pull over shirt/dress     Pull over shirt/dress - Perfomed by patient: Put head through opening          Upper body assist        Lower Body Dressing/Undressing Lower body dressing   What is the patient wearing?: Pants, Non-skid slipper socks     Pants- Performed by patient: Thread/unthread left pants leg Pants- Performed by helper: Thread/unthread right pants leg, Pull pants up/down   Non-skid slipper socks- Performed by helper: Don/doff right sock (left sock NA secondary to amputation)                  Lower body assist        Toileting Toileting Toileting activity did not occur: N/A   Toileting steps completed by helper: Adjust clothing prior to toileting, Performs perineal hygiene, Adjust clothing after toileting Toileting Assistive Devices: Toilet aid, Other (comment)  Toileting assist Assist level: Touching or steadying assistance (Pt.75%)   Transfers Chair/bed transfer Chair/bed transfer activity did not occur:  Safety/medical concerns Chair/bed transfer method: Squat pivot Chair/bed transfer assist level: Moderate assist (Pt 50 - 74%/lift or lower) Chair/bed transfer assistive device: Armrests     Locomotion Ambulation     Max distance:  (6 ft) Assist level: Touching or steadying assistance (Pt > 75%)   Wheelchair   Type: Manual Max wheelchair distance:  (100 ft) Assist Level: Supervision or verbal cues  Cognition Comprehension Comprehension assist level: Follows complex  conversation/direction with no assist  Expression Expression assist level: Expresses complex ideas: With no assist  Social Interaction Social Interaction assist level: Interacts appropriately with others with medication or extra time (anti-anxiety, antidepressant).  Problem Solving Problem solving assist level: Solves basic problems with no assist  Memory Memory assist level: Recognizes or recalls 75 - 89% of the time/requires cueing 10 - 24% of the time    Medical Problem List and Plan:  1. Decreased functional mobility secondary to left AKA 09/18/2016   Begin CIR 2. DVT Prophylaxis/Anticoagulation: SCD RLE. 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 Patient does have an IVC filter.  3. Pain Management: Hydrocodone as needed  4. Mood: Provide emotional support  5. Neuropsych: This patient is capable of making decisions on her own behalf.  6. Skin/Wound Care: Routine skin checks  7. Fluids/Electrolytes/Nutrition: Routine I&Os  -recent labs reviewed. Encourage PO  -keep an eye on sodium 8. Acute blood loss anemia.   hgb 8.8  Cont iron supplementation  9. Hyperkalemia. Resolved.  10. History of gastric/stomach cancer. Follow-up outpatient Dr. Mila Merry  11. Leukocytosis  15k yesterda---recheck monday  LOS (Days) 2 A FACE TO FACE EVALUATION WAS PERFORMED  SWARTZ,ZACHARY T 09/23/2016 9:37 AM

## 2016-09-23 NOTE — Progress Notes (Signed)
Occupational Therapy Note  Patient Details  Name: Jodi Collins MRN: [id number] Date of Birth: November 30, 1950  Today's Date: 09/23/2016 OT Individual Time: 1445-1500 OT Individual Time Calculation (min): 15 min  and Today's Date: 09/23/2016 OT Missed Time: 60 Minutes Missed Time Reason: Patient unwilling/refused to participate without medical reason;Patient fatigue  Upon entering the room, pt seated in wheelchair with no c/o pain. Pt reports feeling ill from prior therapy session. Pt declined OT intervention this session.OT provided education regarding importance of participation towards OT goals. Pt continued to decline. Pt remained in wheelchair with call bell within reach.    Darleen Crocker P 09/23/2016, 3:30 PM

## 2016-09-23 NOTE — Progress Notes (Signed)
Physical Therapy Session Note  Patient Details  Name: Jodi Collins MRN: [id number] Date of Birth: Jun 17, 1950  Today's Date: 09/23/2016 PT Individual Time: 0935-1005 PT Individual Time Calculation (min): 30 min    Skilled Therapeutic Interventions/Progress Updates:    Session 1:  0935-1005:  Session focused on improving endurance and safety for promoting independence with functional mobility.  Pt performed w/c mobility with min assist occasionally for maneuvering through tight spaces.  Pt performed supine to sit with min A and then ambulated 10 ft to w/c with CGA and RW.  Following session, pt returned to room and was left up in wheelchair with call bell in reach and needs met.  Session 2:  7530-1040.  Missed 16 minutes due to fatigue, SOB, desat to 79% on room air and resting HR 116 bpm after prolonged seated rest break.  Pt left with nursing following session for check.  Pt reports that her soup just went through her and she called for help to the bathroom and had significant diarrhea.  Pt noted to have significant drainage from L AKA bandage and drainage dripping on floor.  PT called nursing for assist with changing bandage.  Session focused on promoting functional independence through transfer to bed, bed mobility for donning/doffing clothing, and return to w/c with two short bouts of ambulation with RW with min assist.  Nursing performed dressing change and clean shrinker donned.  Following activity, pt vitals as above.  Nursing notified and therapist explained that the pt HR has been > 100 bpm each session with therapist.  Pt did report 5/10 pain in stomach and stump prior to session and was administered pain meds by nursing.  Precautions maintained t/o session today.  Therapy Documentation Precautions:  Precautions Precautions: Fall Precaution Comments: L AKA, monitor O2 and HR, low HGB Restrictions Weight Bearing Restrictions: Yes LLE Weight Bearing: Non weight bearing   See  Function Navigator for Current Functional Status.   Therapy/Group: Individual Therapy  Laurna Shetley Hilario Quarry 09/23/2016, 12:40 PM

## 2016-09-24 ENCOUNTER — Inpatient Hospital Stay (HOSPITAL_COMMUNITY): Payer: 59

## 2016-09-24 ENCOUNTER — Inpatient Hospital Stay (HOSPITAL_COMMUNITY): Payer: 59 | Admitting: Occupational Therapy

## 2016-09-24 DIAGNOSIS — G8918 Other acute postprocedural pain: Secondary | ICD-10-CM

## 2016-09-24 LAB — CULTURE, BLOOD (ROUTINE X 2)
Culture: NO GROWTH
Culture: NO GROWTH
Special Requests: ADEQUATE
Special Requests: ADEQUATE

## 2016-09-24 LAB — GLUCOSE, CAPILLARY
GLUCOSE-CAPILLARY: 100 mg/dL — AB (ref 65–99)
GLUCOSE-CAPILLARY: 67 mg/dL (ref 65–99)
GLUCOSE-CAPILLARY: 143 mg/dL — AB (ref 65–99)
GLUCOSE-CAPILLARY: 103 mg/dL — AB (ref 65–99)

## 2016-09-24 NOTE — Progress Notes (Signed)
Occupational Therapy Session Note  Patient Details  Name: Jodi Collins MRN: [id number] Date of Birth: 02/20/1951  Today's Date: 09/24/2016 OT Individual Time: 1015-1100 OT Individual Time Calculation (min): 45 min    Short Term Goals: Week 1:  OT Short Term Goal 1 (Week 1): STGs equal to LTGs set at modified independent overall.   Skilled Therapeutic Interventions/Progress Updates:    Pt seen for OT session focusing on education and functional standing balance/ endurance. Pt sitting up in w/c upon arrival voicing need for residual limb wrappings to be changed. She stood with mod A to RW and with steadying assist able to pull pants down. RN applied new dressings. OT then provided education and demonstration for figure eight technique with ACE wraps and educated regarding purpose of wrappings and formation of residual limb. Pt donned new pants due to pants being soiled from drainage. She required assist for threading over R LE as she was unable to bend to floor to reach. Therefore, provided pt with reacher and education/demonstration provided regarding use. She stood in same manner as described above to pull pants up. Completed x2 standing trials at sink in order to increased functional standing balance/ endurance. Pt required to reach across midline to move self care items along counter. She required extended seated rest breaks btwn trials due to decreased activity tolerance. Pt left seated in w/c at end of session, all needs in reach.   Therapy Documentation Precautions:  Precautions Precautions: Fall Precaution Comments: L AKA, monitor O2 and HR, low HGB Restrictions Weight Bearing Restrictions: Yes LLE Weight Bearing: Non weight bearing Pain:   No/ denies pain  See Function Navigator for Current Functional Status.   Therapy/Group: Individual Therapy  Lewis, Devonn Giampietro C 09/24/2016, 6:48 AM

## 2016-09-24 NOTE — Progress Notes (Signed)
Occupational Therapy Session Note  Patient Details  Name: Jodi Collins MRN: [id number] Date of Birth: 1950/12/27  Today's Date: 09/24/2016 OT Individual Time: 1435-1535 OT Individual Time Calculation (min): 60 min    Short Term Goals: Week 1:  OT Short Term Goal 1 (Week 1): STGs equal to LTGs set at modified independent overall.      Skilled Therapeutic Interventions/Progress Updates:    Tx focus on activity tolerance, functional transfers, and UB strengthening.   Pt greeted supine in bed. RN and NT finishing up vitals. Pt reporting increased SOB at rest, though vitals WFL (HR slightly elevated, 120BPM). Pt agreeable to go outdoors for light therapy. Pt transitioning to EOB with extra time and supervision, stand pivot transfer<w/c with Min A and RW. She appeared to be significantly fatigued after this. Pt was then escorted to [facility] front entrance. She reported increased ease with breathing once outside. Pt engaging in bilateral UE therapeutic exercise with yellow theraband. Pt provided with mod verbal and visual instructions to complete. Pt requiring frequent and long rest breaks due to fatigue. Pt self propelling over uneven terrain and up 1 incline, often required rest breaks after 10 seconds of propelling. Diaphragmatic breathing training with pt instructed on hand placement on stomach for ensuring stomach rise and fall. Educated her on combined visualization strategies for improved breathing/endurance during functional activities. Pt was then escorted back to room and set up for lunch. Pt left with all needs at time of departure.  02 sats 99-100% on RA, HR 118BPM. RN made aware.   Therapy Documentation Precautions:  Precautions Precautions: Fall Precaution Comments: L AKA, monitor O2 and HR, low HGB Restrictions Weight Bearing Restrictions: Yes LLE Weight Bearing: Non weight bearing Vital Signs: Therapy Vitals Temp: (!) 97.5 F (36.4 C) Temp Source: Oral Pulse Rate:  (!) 121 Resp: 18 BP: 103/78 Patient Position (if appropriate): Sitting Oxygen Therapy SpO2: 100 % O2 Device: Not Delivered Pain: Pt reporting pain to be manageable with provided rest breaks    ADL:     Exercises: Bilateral UE therapeutic exercise with yellow theraband 10 reps: shoulder flexion, bicep curls, tricep punches, diagonals    Other Treatments:    See Function Navigator for Current Functional Status.   Therapy/Group: Individual Therapy  Miles Leyda A Analeia Ismael 09/24/2016, 4:02 PM

## 2016-09-24 NOTE — Progress Notes (Signed)
Pt c/o of being short of breath.  VS charted, no c/o of chest pain, no c/o of dizziness, HR 109 and regular, last Hgb 8.8 as of 07/06. MD aware

## 2016-09-24 NOTE — Progress Notes (Signed)
[place] PHYSICAL MEDICINE & REHABILITATION     PROGRESS NOTE  Subjective/Complaints:  Worked hard in therapy. Noticed she was short of breath when standing and with transfers.   ROS: pt denies nausea, vomiting, diarrhea, cough, shortness of breath or chest pain   Objective: Vital Signs: Blood pressure 99/63, pulse (!) 102, temperature 97.9 F (36.6 C), temperature source Oral, resp. rate 18, SpO2 98 %. No results found.  Recent Labs  09/22/16 0646  WBC 15.3*  HGB 8.8*  HCT 27.5*  PLT 78*    Recent Labs  09/22/16 0646  NA 133*  K 3.8  CL 106  GLUCOSE 91  BUN 22*  CREATININE 0.65  CALCIUM 8.0*   CBG (last 3)   Recent Labs  09/23/16 1657 09/23/16 2136 09/24/16 0652  GLUCAP 82 97 100*    Wt Readings from Last 3 Encounters:  09/18/16 63.5 kg (140 lb)  09/12/16 63.5 kg (140 lb)  09/11/16 63.5 kg (140 lb)    Physical Exam:  BP 99/63 (BP Location: Left Arm)   Pulse (!) 102   Temp 97.9 F (36.6 C) (Oral)   Resp 18   SpO2 98%  Constitutional: She appears well-developed. NAD. HENT: Normocephalic. Atraumatic Eyes: EOM are normal. No discharge.  Cardiovascular:RRR without murmur. No JVD .  Respiratory: CTA Bilaterally without wheezes or rales. Normal effort    GI: Soft. Bowel sounds are normal.  Neurological: She is alert and oriented.  Motor: B/L UE 5/5 proximal to distal RLE: 4+/5 proximal to distal LLE: 4/5 HF Sensation intact to light touch in all 4's. Cognitively intact.  Psychiatric: Pleasant and cooperative. Normal mood and behavior Skin. Amputation intact with staples. Mild edema   Assessment/Plan: 1. 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 Physiatrist is providing close team supervision and 24 hour management of active medical problems listed below. Physiatrist and rehab team continue to assess barriers to discharge/monitor patient progress  toward functional and medical goals.  Function:  Bathing Bathing position   Position: Sitting EOB  Bathing parts Body parts bathed by patient: Right arm, Left arm, Chest, Abdomen, Right upper leg, Left upper leg, Front perineal area Body parts bathed by helper: Right lower leg, Buttocks, Back  Bathing assist        Upper Body Dressing/Undressing Upper body dressing   What is the patient wearing?: Pull over shirt/dress     Pull over shirt/dress - Perfomed by patient: Put head through opening          Upper body assist        Lower Body Dressing/Undressing Lower body dressing   What is the patient wearing?: Pants, Non-skid slipper socks     Pants- Performed by patient: Thread/unthread left pants leg Pants- Performed by helper: Thread/unthread right pants leg, Pull pants up/down   Non-skid slipper socks- Performed by helper: Don/doff right sock (left sock NA secondary to amputation)                  Lower body assist        Toileting Toileting Toileting activity did not occur: N/A   Toileting steps completed by helper: Adjust clothing prior to toileting, Performs perineal hygiene, Adjust clothing after toileting Toileting Assistive Devices: Toilet aid, Other (comment)  Toileting assist Assist level: Touching or steadying assistance (Pt.75%)   Transfers Chair/bed transfer Chair/bed transfer activity did not occur: Safety/medical concerns Chair/bed transfer method: Ambulatory Chair/bed  transfer assist level: Touching or steadying assistance (Pt > 75%) Chair/bed transfer assistive device: Armrests, Medical sales representative     Max distance: 10 ft Assist level: Touching or steadying assistance (Pt > 75%)   Wheelchair   Type: Manual Max wheelchair distance:  (150 ft) Assist Level: Touching or steadying assistance (Pt > 75%)  Cognition Comprehension Comprehension assist level: Follows complex conversation/direction with no assist  Expression  Expression assist level: Expresses complex ideas: With no assist  Social Interaction Social Interaction assist level: Interacts appropriately with others - No medications needed.  Problem Solving Problem solving assist level: Solves complex problems: Recognizes & self-corrects  Memory Memory assist level: Complete Independence: No helper    Medical Problem List and Plan:  1. Decreased functional mobility secondary to left AKA 09/18/2016   -continue therapies. Pt motivated  -needs shrinker this week 2. DVT Prophylaxis/Anticoagulation: SCD RLE. 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 Patient does have an IVC filter.  3. Pain Management: Hydrocodone as needed  4. Mood: Provide emotional support  5. Neuropsych: This patient is capable of making decisions on her own behalf.  6. Skin/Wound Care: Routine skin checks  7. Fluids/Electrolytes/Nutrition: Routine I&Os  -recent labs reviewed. Encourage PO  -re-check sodium (133) Monday 8. Acute blood loss anemia.   hgb 8.8  Cont iron supplementation  9. Hyperkalemia. Resolved.  10. History of gastric/stomach cancer. Follow-up outpatient Dr. Mila Merry  11. Leukocytosis  15k yesterday---recheck monday  LOS (Days) 3 A FACE TO FACE EVALUATION WAS PERFORMED  Bartolo Montanye T 09/24/2016 7:32 AM

## 2016-09-24 NOTE — Progress Notes (Signed)
Physical Therapy Session Note  Patient Details  Name: Jodi Collins MRN: [id number] Date of Birth: 1950-07-17  Today's Date: 09/24/2016 PT Individual Time: 0900-1000 PT Individual Time Calculation (min): 60 min   Short Term Goals: Week 1:  PT Short Term Goal 1 (Week 1): Pt will ambulate 25 ft with supervision and RW. PT Short Term Goal 2 (Week 1): Pt will perform supine to sit and sit to supine with mod I. PT Short Term Goal 3 (Week 1): Pt will perform sit to stand and bed to chair with supervision.  Skilled Therapeutic Interventions/Progress Updates:    Session focused on functional bed mobility, transfers, sit <> stands, neuro re-ed for dynamic standing balance, w/c mobility and parts management and overall endurance. RN wrapped residual limb wound prior to OOB and pt able to maintain LE elevated for strengthening. Noted clear drainage leaking by end of session and notified RN at end of session. Functional sitting balance EOB for donning clothing and initial sit <> stand with mod assist with cues for hand placement and technique using RW for support. Transfers progressed to min assist throughout session but requires cues each time for correct hand placement and technique. Practiced squat pivot transfer from w/c -> mat with light mod assist to clear bottom and educated on practicing both techniques as pt likely will be using w/c for mobility in the home as well. Discussed and educated on d/c planning and DME needs during rest breaks. Neuro re-ed for dynamic activity while performing functional reaching task in standing with 1 UE support (trials with RUE and then LUE) with overall min assist for balance. W/c parts management training and mobility with focus on pt managing legrests and armrest though limited carryover noted and supervision for propulsion . Pt able to gait x 6' but limited due to fatigue in RLE with overall min assist and cues for efficient technique and rolling RW instead of  lifting it each time.   Therapy Documentation Precautions:  Precautions Precautions: Fall Precaution Comments: L AKA, monitor O2 and HR, low HGB Restrictions Weight Bearing Restrictions: Yes LLE Weight Bearing: Non weight bearing   Vital Signs: Room air - O2 maintained = 100% Pain:  No complaints during session. Reports initially feeling anxious but improved during session.   See Function Navigator for Current Functional Status.   Therapy/Group: Individual Therapy  Canary Brim Ivory Broad, PT, DPT  09/24/2016, 10:23 AM

## 2016-09-25 ENCOUNTER — Ambulatory Visit (HOSPITAL_COMMUNITY): Payer: 59

## 2016-09-25 ENCOUNTER — Inpatient Hospital Stay (HOSPITAL_COMMUNITY): Payer: 59

## 2016-09-25 ENCOUNTER — Inpatient Hospital Stay (HOSPITAL_COMMUNITY): Payer: 59 | Admitting: Physical Therapy

## 2016-09-25 ENCOUNTER — Inpatient Hospital Stay (HOSPITAL_COMMUNITY): Payer: 59 | Admitting: Occupational Therapy

## 2016-09-25 DIAGNOSIS — E871 Hypo-osmolality and hyponatremia: Secondary | ICD-10-CM

## 2016-09-25 DIAGNOSIS — R339 Retention of urine, unspecified: Secondary | ICD-10-CM | POA: Diagnosis not present

## 2016-09-25 DIAGNOSIS — R739 Hyperglycemia, unspecified: Secondary | ICD-10-CM

## 2016-09-25 DIAGNOSIS — D696 Thrombocytopenia, unspecified: Secondary | ICD-10-CM

## 2016-09-25 DIAGNOSIS — R600 Localized edema: Secondary | ICD-10-CM | POA: Insufficient documentation

## 2016-09-25 LAB — GLUCOSE, CAPILLARY
Glucose-Capillary: 111 mg/dL — ABNORMAL HIGH (ref 65–99)
Glucose-Capillary: 107 mg/dL — ABNORMAL HIGH (ref 65–99)
Glucose-Capillary: 111 mg/dL — ABNORMAL HIGH (ref 65–99)
Glucose-Capillary: 103 mg/dL — ABNORMAL HIGH (ref 65–99)

## 2016-09-25 LAB — CBC
HCT: 25 % — ABNORMAL LOW (ref 36.0–46.0)
Hemoglobin: 8 g/dL — ABNORMAL LOW (ref 12.0–15.0)
MCH: 28 pg (ref 26.0–34.0)
MCHC: 32 g/dL (ref 30.0–36.0)
MCV: 87.4 fL (ref 78.0–100.0)
PLATELETS: 106 10*3/uL — AB (ref 150–400)
RBC: 2.86 MIL/uL — AB (ref 3.87–5.11)
RDW: 17.4 % — ABNORMAL HIGH (ref 11.5–15.5)
WBC: 12.3 10*3/uL — AB (ref 4.0–10.5)

## 2016-09-25 LAB — BASIC METABOLIC PANEL
Anion gap: 9 (ref 5–15)
BUN: 36 mg/dL — ABNORMAL HIGH (ref 6–20)
CO2: 19 mmol/L — AB (ref 22–32)
CREATININE: 0.86 mg/dL (ref 0.44–1.00)
Calcium: 8.3 mg/dL — ABNORMAL LOW (ref 8.9–10.3)
Chloride: 103 mmol/L (ref 101–111)
GFR calc non Af Amer: >60 mL/min (ref >60)
Glucose, Bld: 113 mg/dL — ABNORMAL HIGH (ref 65–99)
Potassium: 4.7 mmol/L (ref 3.5–5.1)
SODIUM: 131 mmol/L — AB (ref 135–145)

## 2016-09-25 LAB — BRAIN NATRIURETIC PEPTIDE: B NATRIURETIC PEPTIDE 5: 23.6 pg/mL (ref 0.0–100.0)

## 2016-09-25 MED ORDER — FUROSEMIDE 20 MG PO TABS
20.0000 mg | ORAL_TABLET | Freq: Two times a day (BID) | ORAL | Status: DC
  Administered 2016-09-26 (×2): 20 mg via ORAL
  Filled 2016-09-25 (×3): qty 1

## 2016-09-25 NOTE — Progress Notes (Signed)
[place] PHYSICAL MEDICINE & REHABILITATION     PROGRESS NOTE  Subjective/Complaints:  Pt seen laying in bed this AM.  She states she had difficulty sleeping due to urinary issues.  Per nursing also, pt with retention, but unable to remove urine with cath.  ROS: Denies CP, SOB, N/V/D.  Objective: Vital Signs: Blood pressure (!) 96/57, pulse (!) 111, temperature 97.8 F (36.6 C), temperature source Oral, resp. rate 20, weight 69.9 kg (154 lb 3.2 oz), SpO2 100 %. No results found.  Recent Labs  09/25/16 0411  WBC 12.3*  HGB 8.0*  HCT 25.0*  PLT 106*    Recent Labs  09/25/16 0411  NA 131*  K 4.7  CL 103  GLUCOSE 113*  BUN 36*  CREATININE 0.86  CALCIUM 8.3*   CBG (last 3)   Recent Labs  09/24/16 1645 09/24/16 2100 09/25/16 0642  GLUCAP 143* 103* 111*    Wt Readings from Last 3 Encounters:  09/25/16 69.9 kg (154 lb 3.2 oz)  09/18/16 63.5 kg (140 lb)  09/12/16 63.5 kg (140 lb)    Physical Exam:  BP (!) 96/57 (BP Location: Right Arm)   Pulse (!) 111   Temp 97.8 F (36.6 C) (Oral)   Resp 20   Wt 69.9 kg (154 lb 3.2 oz)   SpO2 100%   BMI 28.20 kg/m  Constitutional: She appears well-developed. NAD. HENT: Normocephalic. Atraumatic Eyes: EOM are normal. No discharge.  Cardiovascular: Tachycardia. Regular rhythm. No JVD.  Respiratory: CTA Bilaterally. Normal effort    GI: Soft. Bowel sounds are normal.  Musc: Edema. No tenderness Neurological: She is alert and oriented.  Motor: B/L UE 5/5 proximal to distal RLE: 4+/5 proximal to distal LLE: 4/5 HF Psychiatric: Pleasant and cooperative. Normal mood and behavior Skin. Amputation intact with staples c/d/i  Assessment/Plan: 1. 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 Physiatrist is providing close team supervision and 24 hour management of active medical problems listed below. Physiatrist and rehab  team continue to assess barriers to discharge/monitor patient progress toward functional and medical goals.  Function:  Bathing Bathing position   Position: Sitting EOB  Bathing parts Body parts bathed by patient: Right arm, Left arm, Chest, Abdomen, Right upper leg, Left upper leg, Front perineal area Body parts bathed by helper: Right lower leg, Buttocks, Back  Bathing assist        Upper Body Dressing/Undressing Upper body dressing   What is the patient wearing?: Pull over shirt/dress     Pull over shirt/dress - Perfomed by patient: Put head through opening          Upper body assist        Lower Body Dressing/Undressing Lower body dressing   What is the patient wearing?: Pants     Pants- Performed by patient: Thread/unthread left pants leg Pants- Performed by helper: Thread/unthread right pants leg, Pull pants up/down   Non-skid slipper socks- Performed by helper: Don/doff right sock                  Lower body assist Assist for lower body dressing: Touching or steadying assistance (Pt > 75%)      Toileting Toileting Toileting activity did not occur: N/A   Toileting steps completed by helper: Adjust clothing prior to toileting, Performs perineal hygiene, Adjust clothing after toileting Toileting Assistive Devices: Toilet aid  Toileting assist Assist level: Touching or steadying assistance (Pt.75%)  Transfers Chair/bed transfer Chair/bed transfer activity did not occur: Safety/medical concerns Chair/bed transfer method: Stand pivot Chair/bed transfer assist level: Touching or steadying assistance (Pt > 75%) Chair/bed transfer assistive device: Walker, Air cabin crew     Max distance: 6' Assist level: Touching or steadying assistance (Pt > 75%)   Wheelchair   Type: Manual Max wheelchair distance: 150' Assist Level: Supervision or verbal cues  Cognition Comprehension Comprehension assist level: Follows basic  conversation/direction with no assist  Expression Expression assist level: Expresses basic needs/ideas: With no assist  Social Interaction Social Interaction assist level: Interacts appropriately 90% of the time - Needs monitoring or encouragement for participation or interaction.  Problem Solving Problem solving assist level: Solves basic problems with no assist  Memory Memory assist level: Recognizes or recalls 90% of the time/requires cueing < 10% of the time    Medical Problem List and Plan:  1. Decreased functional mobility secondary to left AKA 09/18/2016   Cont CIR 2. DVT Prophylaxis/Anticoagulation: SCD RLE. 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 Patient does have an IVC filter.  3. Pain Management: Hydrocodone as needed  4. Mood: Provide emotional support  5. Neuropsych: This patient is capable of making decisions on her own behalf.  6. Skin/Wound Care: Routine skin checks  7. Fluids/Electrolytes/Nutrition: Routine I&Os  Encourage PO 8. Acute blood loss anemia.   hgb 8.0 on 7/9  Cont iron supplementation  9. Hyperkalemia. Resolved.  10. History of gastric/stomach cancer. Follow-up outpatient Dr. Mila Merry  11. Leukocytosis  WBCs 12.3 on 7/9  Cont to monitor 12. Hyperglycemia  Likely stress induced  Cont to monitor 13. Hyponatremia  Na 131 on 7/9  Cont to monitor 14. Thrombocytopenia  Plts 106 on 7/9  Cont to monitor  Will consider Heme/Onc 15. Urinary retention   Unable to remove fluid with I/O, foley ordered  CXR ordered  Lasix ordered  BNP ordered   LOS (Days) 4 A FACE TO FACE EVALUATION WAS PERFORMED  Ankit Lorie Phenix 09/25/2016 8:24 AM

## 2016-09-25 NOTE — Progress Notes (Signed)
Occupational Therapy Note  Patient Details  Name: Jodi Collins MRN: [id number] Date of Birth: [birth year]  Pt declined PM therapy secondary to fatigue and not resting well last night.  Nursing made aware as well.  Pt missed 45 mins total.     Jourdon Zimmerle OTR/L 09/25/2016, 2:27 PM

## 2016-09-25 NOTE — Progress Notes (Signed)
Patient voided very little, bladder scan for 693. I&O cath 3 time with 3 different nurses but no urine came out. Patient with 3+ edema both lower extremities. PA notified and he ordered chest X-ray 2 view, the order was carried out per protocol. We continue to monitor.

## 2016-09-25 NOTE — Progress Notes (Signed)
Marlowe [place], PA here. Notifiedthat despite foley draining scant amount urine bladder scan indicates 700 ml in bladder with distention,.

## 2016-09-25 NOTE — Progress Notes (Signed)
Physical Therapy Session Note  Patient Details  Name: Jodi Collins MRN: [id number] Date of Birth: 1951-03-19  Today's Date: 09/25/2016 PT Individual Time: 0388-8280 PT Individual Time Calculation (min): 27 min   Short Term Goals: Week 1:  PT Short Term Goal 1 (Week 1): Pt will ambulate 25 ft with supervision and RW. PT Short Term Goal 2 (Week 1): Pt will perform supine to sit and sit to supine with mod I. PT Short Term Goal 3 (Week 1): Pt will perform sit to stand and bed to chair with supervision.  Skilled Therapeutic Interventions/Progress Updates:    Pt reporting pain due to inability to urinate. Pt just back from x-ray and NT/RN to place foley - Pt missed time due to this. Pt agreeable to get up to EOB and attempt transfer. Education and discussion in regards to d/c planning during rest breaks as pt requires significant amount of extra time due to pain and fatigue for mobility attempts. Min assist for trunk management. Pt able to sit to EOB with supervision while dressing change to residual limb completed with cues for balance technique and pt lifting LLE. Due to time, unable to complete transfer and repositioned in supine (supervision) with RN attending to dressing change.   Therapy Documentation Precautions:  Precautions Precautions: Fall Precaution Comments: L AKA, monitor O2 and HR, low HGB Restrictions Weight Bearing Restrictions: Yes LLE Weight Bearing: Non weight bearing General: PT Amount of Missed Time (min): 18 Minutes PT Missed Treatment Reason: Nursing care (placement of foley)    See Function Navigator for Current Functional Status.   Therapy/Group: Individual Therapy  Canary Brim Ivory Broad, PT, DPT  09/25/2016, 10:04 AM

## 2016-09-25 NOTE — Progress Notes (Signed)
Physical Therapy Note  Patient Details  Name: Jodi Collins MRN: [id number] Date of Birth: 10-29-1950 Today's Date: 09/25/2016    Time: 1000-1040 40 minutes  1:1 No c/o pain. Pt c/o fatigue from "not sleeping well at all last night"  Pt willing to participate with encouragement.  Supine to sit with mod A, cues for hand placement and sequencing.  Sit to stand with RW with mod A, total A to pull up pants.  Stand pivot transfer with mod  A to stand, min A for balance with transfers.  W/c mobility x 100' with multiple rest breaks with supervision.  UE therex with 4# dowel x 20 each.  Attempt w/c push ups, pt unable to perform despite tactile and verbal cues.  Pt performed seated partial crunches x 15.  Squat pivot transfer to bed with min A.  Min A sit to supine.  Pt left in bed with needs at hand.   [name] 09/25/2016, 10:39 AM

## 2016-09-26 ENCOUNTER — Inpatient Hospital Stay (HOSPITAL_COMMUNITY): Payer: 59 | Admitting: Physical Therapy

## 2016-09-26 ENCOUNTER — Inpatient Hospital Stay (HOSPITAL_COMMUNITY): Payer: 59 | Admitting: Occupational Therapy

## 2016-09-26 ENCOUNTER — Inpatient Hospital Stay (HOSPITAL_COMMUNITY): Payer: 59

## 2016-09-26 ENCOUNTER — Encounter (HOSPITAL_COMMUNITY): Payer: Self-pay

## 2016-09-26 ENCOUNTER — Encounter (HOSPITAL_COMMUNITY): Payer: 59 | Admitting: Psychology

## 2016-09-26 DIAGNOSIS — F4323 Adjustment disorder with mixed anxiety and depressed mood: Secondary | ICD-10-CM | POA: Insufficient documentation

## 2016-09-26 DIAGNOSIS — N179 Acute kidney failure, unspecified: Secondary | ICD-10-CM | POA: Insufficient documentation

## 2016-09-26 LAB — CBC WITH DIFFERENTIAL/PLATELET
BASOS ABS: 0 10*3/uL (ref 0.0–0.1)
BASOS PCT: 0 %
Eosinophils Absolute: 0 10*3/uL (ref 0.0–0.7)
Eosinophils Relative: 0 %
HEMATOCRIT: 23.1 % — AB (ref 36.0–46.0)
Hemoglobin: 7.2 g/dL — ABNORMAL LOW (ref 12.0–15.0)
LYMPHS PCT: 10 %
Lymphs Abs: 1.3 10*3/uL (ref 0.7–4.0)
MCH: 26.9 pg (ref 26.0–34.0)
MCHC: 31.2 g/dL (ref 30.0–36.0)
MCV: 86.2 fL (ref 78.0–100.0)
Monocytes Absolute: 0.4 10*3/uL (ref 0.1–1.0)
Monocytes Relative: 3 %
NEUTROS ABS: 11 10*3/uL — AB (ref 1.7–7.7)
Neutrophils Relative %: 86 %
Platelets: 113 10*3/uL — ABNORMAL LOW (ref 150–400)
RBC: 2.68 MIL/uL — AB (ref 3.87–5.11)
RDW: 17.8 % — ABNORMAL HIGH (ref 11.5–15.5)
WBC: 12.8 10*3/uL — AB (ref 4.0–10.5)

## 2016-09-26 LAB — BASIC METABOLIC PANEL
ANION GAP: 10 (ref 5–15)
BUN: 47 mg/dL — ABNORMAL HIGH (ref 6–20)
CO2: 17 mmol/L — ABNORMAL LOW (ref 22–32)
Calcium: 8.2 mg/dL — ABNORMAL LOW (ref 8.9–10.3)
Chloride: 102 mmol/L (ref 101–111)
Creatinine, Ser: 1.15 mg/dL — ABNORMAL HIGH (ref 0.44–1.00)
GFR, EST AFRICAN AMERICAN: 57 mL/min — AB (ref >60)
GFR, EST NON AFRICAN AMERICAN: 49 mL/min — AB (ref >60)
GLUCOSE: 102 mg/dL — AB (ref 65–99)
POTASSIUM: 5.1 mmol/L (ref 3.5–5.1)
Sodium: 129 mmol/L — ABNORMAL LOW (ref 135–145)

## 2016-09-26 LAB — GLUCOSE, CAPILLARY
GLUCOSE-CAPILLARY: 96 mg/dL (ref 65–99)
GLUCOSE-CAPILLARY: 99 mg/dL (ref 65–99)
Glucose-Capillary: 97 mg/dL (ref 65–99)
Glucose-Capillary: 101 mg/dL — ABNORMAL HIGH (ref 65–99)

## 2016-09-26 MED ORDER — SODIUM CHLORIDE 0.9% FLUSH: 10.0000 mL | INTRAVENOUS | Status: DC | PRN

## 2016-09-26 MED ORDER — SODIUM CHLORIDE 0.9 % IV SOLN: INTRAVENOUS | Status: DC

## 2016-09-26 MED ORDER — WHITE PETROLATUM GEL
Status: AC
  Administered 2016-09-26: 18:00:00
  Filled 2016-09-26: qty 1

## 2016-09-26 NOTE — Progress Notes (Signed)
Physical Therapy Session Note  Patient Details  Name: Jodi Collins MRN: [id number] Date of Birth: 18-Aug-1950  Today's Date: 09/26/2016 PT Individual Time: Missed 30 minutes     Short Term Goals: Week 1:  PT Short Term Goal 1 (Week 1): Pt will ambulate 25 ft with supervision and RW. PT Short Term Goal 2 (Week 1): Pt will perform supine to sit and sit to supine with mod I. PT Short Term Goal 3 (Week 1): Pt will perform sit to stand and bed to chair with supervision.  Skilled Therapeutic Interventions/Progress Updates:    Pt sitting in w/c upon PT arrival. Pt reported her R LE was hurting too much and she did not think she could participate. PT examined R LE to note pain 6/10 at rest, increased tenderness upon palpation, increased swelling with pitting edema (2+) and low blood pressure 93/64. RN notified of finding, pt left sitting in w/c with RN.   Therapy Documentation Precautions:  Precautions Precautions: Fall Precaution Comments: L AKA, monitor O2 and HR, low HGB Restrictions Weight Bearing Restrictions: Yes LLE Weight Bearing: Non weight bearing   See Function Navigator for Current Functional Status.   Therapy/Group: Individual Therapy  Netta Corrigan, PT, DPT 09/26/2016, 1:57 PM

## 2016-09-26 NOTE — Progress Notes (Signed)
Social Work Patient ID: Jodi Collins, female   DOB: 06-Dec-1950, 66 y.o.   MRN: [id number]  Working on getting pt's wheelchair switched out for one with removable arm rests. She has the right size Wheelchair [phone]). Have faxed new order to [employer] the agency who provided her wheelchair that is at home. Work on getting her the right wheelchair for home.

## 2016-09-26 NOTE — Progress Notes (Signed)
Occupational Therapy Note  Patient Details  Name: Jodi Collins MRN: [id number] Date of Birth: [birth year]  Today's Date: 09/26/2016 OT Individual Time: 1120-1205 OT Individual Time Calculation (min): 45 min   Pt missed 45 mins of 90 minute OT session this am secondary to being gone for a procedure.     Janyth Riera OTR/L 09/26/2016, 12:18 PM

## 2016-09-26 NOTE — Consult Note (Signed)
Neuropsychological Consultation   Patient:   Jodi Collins   DOB:   [birth year]  MR Number:  [id number]  Location:  [facility] [address] [facility] [address] [id number] [place] [address] Dept: [place]: [phone]           Date of Service:   09/26/2016  Start Time:   2:30 End Time:   3:30  Provider/Observer:  Ilean Skill, Psy.D.       Clinical Neuropsychologist       Billing Code/Service: [phone] 4 Units  Chief Complaint:    Jodi Collins was referred for neuropsychological consultation due to coping issues following above knee amputation.   She has also been coping with dx of stomach cancer and chemotherapy.  Reason for Service:  Jodi Collins is a 66 year old with history of DVT/pulmanary emboli back in May 2018.  Presented recently in ED with shortness and other symptoms and CT of abdomen showed gastric/stomach cancer.  She also had IVC filter.  Started chemotherapy.  On 09/08/2016 patient started increasing left leg pain and swelling.  Necrotic changes in left lower extremity.  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  Patient admitted for comprehensive rehabilitation program.    Current Status:  Stress and adjustment/coping issues  Reliability of Information: Information from review of medical records, 1 hour face to face with patient and discussion of case with treatment team members.  Behavioral Observation: Jodi Collins  presents as a 66 y.o.-year-old Right African American Female who appeared her stated age. her dress was Appropriate and she was Well Groomed and her manners were Appropriate to the situation.  her participation was indicative of Appropriate and Attentive behaviors.  There were physical disabilities noted due to AKA.  she displayed an appropriate level of cooperation and motivation.     Interactions:    Active Appropriate and Attentive  Attention:   within  normal limits and attention span and concentration were age appropriate  Memory:   within normal limits; recent and remote memory intact  Visuo-spatial:  within normal limits  Speech (Volume):  low  Speech:   normal;   Thought Process:  Coherent and Relevant  Though Content:  WNL;   Orientation:   person, place, time/date and situation  Judgment:   Good  Planning:   Good  Affect:    Anxious and Depressed  Mood:    Anxious and Depressed  Insight:   Lacking  Intelligence:   normal  Substance Use:  No concerns of substance abuse are reported.    Medical History:   Past Medical History:  Diagnosis Date  . Anemia   . DVT (deep venous thrombosis) ([facility]) 08/17/2016  . 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 GI bleed   . Hyperkalemia   . Hyponatremia   . PE (pulmonary thromboembolism) ([facility]) 08/28/2016  . 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 Thrombocytopenia ([place])         Family Med/Psych History:  Family History  Problem Relation Age of Onset  . Hypertension Mother   . Diabetes Mother     Risk of Suicide/Violence: low The patient is having coping issues with depressive symptoms but denies SI or HI  Impression/DX:  Jodi Collins is a 66 year old with history of DVT/pulmanary emboli back in May 2018.  Presented recently in ED with shortness and other symptoms and CT of abdomen showed gastric/stomach cancer.  She also had IVC filter.  Started chemotherapy.  On 09/08/2016 patient started increasing left leg pain and swelling.  Necrotic changes in left lower extremity.  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  Patient admitted for comprehensive rehabilitation program.  Jodi Collins was referred for neuropsychological consultation due to coping issues following above knee amputation.   She has also been coping with dx of stomach cancer and chemotherapy.  Disposition/Plan:  Will see the patient again to continue to work on coping and  adjustment issues.         Electronically Signed   _______________________ Ilean Skill, Psy.D.

## 2016-09-26 NOTE — Progress Notes (Signed)
Occupational Therapy Session Note  Patient Details  Name: Dayleen Beske MRN: [id number] Date of Birth: Nov 16, 1950  Today's Date: 09/26/2016 OT Individual Time: 1120-1205 OT Individual Time Calculation (min): 45 min    Short Term Goals: Week 1:  OT Short Term Goal 1 (Week 1): STGs equal to LTGs set at modified independent overall.   Skilled Therapeutic Interventions/Progress Updates:    Pt in bed to start session just back from ultrasound.  Agreed to participate in session.  Transferred from supine to sit EOB with mod demonstrational cuing and min assist.  Pt with increased shortness of breath and increased anxiety when sitting.  Needed extra time before initiating and completing transfer to wheelchair.  Initially attempted to have pt try shower this session, however pt too anxious and requested to try shower tomorrow.  Pt concerned about edema in her RLE as well as pain in the right foot.  Did complete squat pivot transfer to the wheelchair with mod assist.  Worked on wheelchair mobility to the therapy gym and back with 2-3 rest breaks.  Unable to obtain pulse ox reading with multiple attempts on different fingers, but dyspnea 3/4 noted.  Pt returned to room and remained up in wheelchair in room with call button and phone in reach.    Therapy Documentation Precautions:  Precautions Precautions: Fall Precaution Comments: L AKA, monitor O2 and HR, low HGB Restrictions Weight Bearing Restrictions: Yes LLE Weight Bearing: Non weight bearing  Pain: Pain Assessment Pain Assessment: Faces Pain Score: 0-No pain Faces Pain Scale: Hurts even more Pain Type: Acute pain Pain Location: Foot Pain Orientation: Right Pain Descriptors / Indicators: Discomfort Pain Onset: With Activity Patients Stated Pain Goal: 0 Pain Intervention(s): Repositioned PAINAD (Pain Assessment in Advanced Dementia) Breathing: normal Negative Vocalization: none Facial Expression: smiling or inexpressive Body  Language: relaxed Consolability: no need to console PAINAD Score: 0 ADL:  See Function Navigator for Current Functional Status.   Therapy/Group: Individual Therapy  Norabelle Kondo OTR/L 09/26/2016, 12:09 PM

## 2016-09-26 NOTE — Progress Notes (Signed)
Occupational Therapy Session Note  Patient Details  Name: Jodi Collins MRN: [id number] Date of Birth: [birth year]  Today's Date: 09/26/2016 OT Individual Time: 1300-1327 OT Individual Time Calculation (min): 27 min    Short Term Goals: Week 1:  OT Short Term Goal 1 (Week 1): STGs equal to LTGs set at modified independent overall.   Skilled Therapeutic Interventions/Progress Updates:    OT treatment session focused on LB there-ex and edema management. Pt reported pain and discomfort of R LE with onset of edema within last 24 hours. Educated pt on exercises for edema management and discussed possible interventions depending on cause of edema. Pt completed 3 sets x 15 reps of ankle pumps, knee extension, hip flexion, hip abd/add. Pt declined to attempt sit<>stands despite encouragement 2/2 pain and fatigue. Pt required assistance to don new sock on R LE 2/2 edema and pain with forward reach. Pt left seated in wc with R LE elevated and needs met.   Therapy Documentation Precautions:  Precautions Precautions: Fall Precaution Comments: L AKA, monitor O2 and HR, low HGB Restrictions Weight Bearing Restrictions: Yes LLE Weight Bearing: Non weight bearing General: General OT Amount of Missed Time: 45 Minutes Vital Signs:  Pain: Pain Assessment Pain Assessment: Faces Pain Score: 0-No pain Faces Pain Scale: Hurts even more Pain Type: Acute pain Pain Location: Foot Pain Orientation: Right Pain Descriptors / Indicators: Discomfort Pain Onset: With Activity Patients Stated Pain Goal: 0 Pain Intervention(s): Repositioned PAINAD (Pain Assessment in Advanced Dementia) Breathing: normal Negative Vocalization: none Facial Expression: smiling or inexpressive Body Language: relaxed Consolability: no need to console PAINAD Score: 0 ADL:   Vision   Perception    Praxis   Exercises:   Other Treatments:    See Function Navigator for Current Functional  Status.   Therapy/Group: Individual Therapy  Valma Cava 09/26/2016, 1:28 PM

## 2016-09-26 NOTE — Progress Notes (Signed)
PHYSICAL MEDICINE & REHABILITATION     PROGRESS NOTE  Subjective/Complaints:  Pt seen laying in bed this Am.  She slept well overnight.  Spoke to nursing, pt now with output from foley.   ROS: Denies CP, SOB, N/V/D.  Objective: Vital Signs: Blood pressure (!) 88/59, pulse (!) 105, temperature 97.6 F (36.4 C), temperature source Oral, resp. rate 18, weight 70.3 kg (154 lb 15.7 oz), SpO2 94 %. Dg Chest 2 View  Result Date: 09/25/2016 CLINICAL DATA:  Body edema. EXAM: CHEST  2 VIEW COMPARISON:  Chest x-ray dated 09/18/2016 FINDINGS: Heart size and pulmonary vascularity are normal. Right lung is clear. Slight compressive atelectasis in the left lung due to large diaphragmatic hernia. Power port in place, unchanged. Bones are normal. IMPRESSION: No acute abnormality. 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 Electronically Signed   By: Lorriane Shire M.D.   On: 09/25/2016 08:33    Recent Labs  09/25/16 0411 09/26/16 0442  WBC 12.3* 12.8*  HGB 8.0* 7.2*  HCT 25.0* 23.1*  PLT 106* 113*    Recent Labs  09/25/16 0411 09/26/16 0442  NA 131* 129*  K 4.7 5.1  CL 103 102  GLUCOSE 113* 102*  BUN 36* 47*  CREATININE 0.86 1.15*  CALCIUM 8.3* 8.2*   CBG (last 3)   Recent Labs  09/25/16 1638 09/25/16 2104 09/26/16 0650  GLUCAP 111* 103* 97    Wt Readings from Last 3 Encounters:  09/26/16 70.3 kg (154 lb 15.7 oz)  09/18/16 63.5 kg (140 lb)  09/12/16 63.5 kg (140 lb)    Physical Exam:  BP (!) 88/59 (BP Location: Left Arm)   Pulse (!) 105   Temp 97.6 F (36.4 C) (Oral)   Resp 18   Wt 70.3 kg (154 lb 15.7 oz)   LMP  (LMP Unknown)   SpO2 94%   BMI 28.35 kg/m  Constitutional: She appears well-developed. NAD. HENT: Normocephalic. Atraumatic Eyes: EOM are normal. No discharge.  Cardiovascular: +Tachycardia. Regular rhythm. No JVD.  Respiratory: CTA Bilaterally. Normal effort    GI: Soft. Bowel sounds are  normal.  Musc: Edema. No tenderness Neurological: She is alert and oriented.  Motor: B/L UE 5/5 proximal to distal RLE: 4+/5 proximal to distal LLE: 4/5 HF Psychiatric: Pleasant and cooperative. Normal mood and behavior Skin. Amputation intact with staples c/d/i  Assessment/Plan: 1. 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 Physiatrist is providing close team supervision and 24 hour management of active medical problems listed below. Physiatrist and rehab team continue to assess barriers to discharge/monitor patient progress toward functional and medical goals.  Function:  Bathing Bathing position   Position: Sitting EOB  Bathing parts Body parts bathed by patient: Right arm, Left arm, Chest, Abdomen, Right upper leg, Left upper leg, Front perineal area Body parts bathed by helper: Right lower leg, Buttocks, Back  Bathing assist        Upper Body Dressing/Undressing Upper body dressing   What is the patient wearing?: Pull over shirt/dress     Pull over shirt/dress - Perfomed by patient: Put head through opening          Upper body assist        Lower Body Dressing/Undressing Lower body dressing   What is the patient wearing?: Pants     Pants- Performed by patient: Thread/unthread left pants leg Pants- Performed  by helper: Thread/unthread right pants leg, Pull pants up/down   Non-skid slipper socks- Performed by helper: Don/doff right sock                  Lower body assist Assist for lower body dressing: Touching or steadying assistance (Pt > 75%)      Toileting Toileting Toileting activity did not occur: N/A   Toileting steps completed by helper: Adjust clothing prior to toileting, Performs perineal hygiene, Adjust clothing after toileting Toileting Assistive Devices: Toilet aid  Toileting assist Assist level: Touching or steadying assistance  (Pt.75%)   Transfers Chair/bed transfer Chair/bed transfer activity did not occur: Safety/medical concerns Chair/bed transfer method: Stand pivot Chair/bed transfer assist level: Touching or steadying assistance (Pt > 75%) Chair/bed transfer assistive device: Walker, Air cabin crew     Max distance: 6' Assist level: Touching or steadying assistance (Pt > 75%)   Wheelchair   Type: Manual Max wheelchair distance: 150' Assist Level: Supervision or verbal cues  Cognition Comprehension Comprehension assist level: Understands basic 90% of the time/cues < 10% of the time  Expression Expression assist level: Expresses basic 90% of the time/requires cueing < 10% of the time.  Social Interaction Social Interaction assist level: Interacts appropriately 90% of the time - Needs monitoring or encouragement for participation or interaction.  Problem Solving Problem solving assist level: Solves basic 90% of the time/requires cueing < 10% of the time  Memory Memory assist level: Recognizes or recalls 90% of the time/requires cueing < 10% of the time    Medical Problem List and Plan:  1. Decreased functional mobility secondary to left AKA 09/18/2016   Cont CIR 2. DVT Prophylaxis/Anticoagulation: SCD RLE. 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 Patient does have an IVC filter.  3. Pain Management: Hydrocodone as needed  4. Mood: Provide emotional support  5. Neuropsych: This patient is capable of making decisions on her own behalf.  6. Skin/Wound Care: Routine skin checks  7. Fluids/Electrolytes/Nutrition: Routine I&Os 8. Acute blood loss anemia.   hgb 7.2 on 7/10  Cont iron supplementation  9. Hyperkalemia. Resolved.  10. History of gastric/stomach cancer. Follow-up outpatient Dr. Mila Merry  11. Leukocytosis  WBCs 12.8 on 7/10  Cont to monitor 12. Hyperglycemia  Likely stress induced  Overall controlled 7/10  Cont to  monitor 13. Hyponatremia  Na 129 on 7/10  Cont to monitor 14. Thrombocytopenia  Plts 113 on 7/10  Cont to monitor  Will consider Heme/Onc 15. Urinary retention   Unable to remove fluid with I/O, foley ordered, cont  CXR reviewed, unremarkable for fluid  Cont Lasix   BNP WNL  Will order kidney U/S  May need FAST  May need Heme/Onc consult 16. AKI  Cr. 1.15  Likely secondary to diuretic  IVF started  Encourage fluids   LOS (Days) 5 A FACE TO FACE EVALUATION WAS PERFORMED  Carder Yin Lorie Phenix 09/26/2016 8:01 AM

## 2016-09-26 NOTE — Progress Notes (Signed)
Physical Therapy Session Note  Patient Details  Name: Jodi Collins MRN: [id number] Date of Birth: [birth year]  Today's Date: 09/26/2016 PT Individual Time: 0829-0930 PT Individual Time Calculation (min): 61 min   Short Term Goals: Week 1:  PT Short Term Goal 1 (Week 1): Pt will ambulate 25 ft with supervision and RW. PT Short Term Goal 2 (Week 1): Pt will perform supine to sit and sit to supine with mod I. PT Short Term Goal 3 (Week 1): Pt will perform sit to stand and bed to chair with supervision.  Skilled Therapeutic Interventions/Progress Updates:    Pt in bed upon arrival, agreeable to PT session. Throughout session, pt requiring frequent rest breaks due to feeling fatigued and short of breath. Bed Mobility: min assist supine>sitting with HOB elevated and using rails. Transfers performed with stand pivot transfers using rw including bed<>w/c, w/c<>mat table. Cues for hand placement needed and sequence for scooting forward and Rt LE placement. Increasing assist needed during session due to reports of fatigue. Exercise: sitting Rt LE hip abd/add, LAQ, LLE hip abd/add, flexion. W/C: pt able to propel with supervision on flat/level. Following session, pt up in w/c with Rt LE elevated, all needs in reach.   Therapy Documentation Precautions:  Precautions Precautions: Fall Precaution Comments: L AKA, monitor O2 and HR, low HGB Restrictions Weight Bearing Restrictions: Yes LLE Weight Bearing: Non weight bearing Vital Signs: SpO2 96% on RA during session.  Pain: Denies pain   See Function Navigator for Current Functional Status.   Therapy/Group: Individual Therapy  Linard Millers, PT 09/26/2016, 4:49 PM

## 2016-09-27 ENCOUNTER — Inpatient Hospital Stay (HOSPITAL_COMMUNITY): Payer: 59 | Admitting: Occupational Therapy

## 2016-09-27 ENCOUNTER — Ambulatory Visit (HOSPITAL_COMMUNITY): Payer: 59

## 2016-09-27 ENCOUNTER — Encounter (HOSPITAL_COMMUNITY): Payer: 59

## 2016-09-27 ENCOUNTER — Inpatient Hospital Stay (HOSPITAL_COMMUNITY): Payer: 59

## 2016-09-27 DIAGNOSIS — Z89619 Acquired absence of unspecified leg above knee: Secondary | ICD-10-CM

## 2016-09-27 DIAGNOSIS — F4323 Adjustment disorder with mixed anxiety and depressed mood: Secondary | ICD-10-CM

## 2016-09-27 DIAGNOSIS — R180 Malignant ascites: Secondary | ICD-10-CM

## 2016-09-27 LAB — CBC WITH DIFFERENTIAL/PLATELET
BASOS ABS: 0 10*3/uL (ref 0.0–0.1)
BASOS PCT: 0 %
EOS PCT: 0 %
Eosinophils Absolute: 0 10*3/uL (ref 0.0–0.7)
HEMATOCRIT: 20 % — AB (ref 36.0–46.0)
Hemoglobin: 6.4 g/dL — CL (ref 12.0–15.0)
LYMPHS PCT: 13 %
Lymphs Abs: 1.5 10*3/uL (ref 0.7–4.0)
MCH: 27.4 pg (ref 26.0–34.0)
MCHC: 32 g/dL (ref 30.0–36.0)
MCV: 85.5 fL (ref 78.0–100.0)
MONO ABS: 0.5 10*3/uL (ref 0.1–1.0)
Monocytes Relative: 4 %
NEUTROS ABS: 10.2 10*3/uL — AB (ref 1.7–7.7)
Neutrophils Relative %: 83 %
PLATELETS: 120 10*3/uL — AB (ref 150–400)
RBC: 2.34 MIL/uL — ABNORMAL LOW (ref 3.87–5.11)
RDW: 17.9 % — AB (ref 11.5–15.5)
WBC: 12.2 10*3/uL — ABNORMAL HIGH (ref 4.0–10.5)

## 2016-09-27 LAB — BASIC METABOLIC PANEL
ANION GAP: 10 (ref 5–15)
BUN: 56 mg/dL — ABNORMAL HIGH (ref 6–20)
CALCIUM: 7.9 mg/dL — AB (ref 8.9–10.3)
CO2: 17 mmol/L — ABNORMAL LOW (ref 22–32)
Chloride: 100 mmol/L — ABNORMAL LOW (ref 101–111)
Creatinine, Ser: 1.45 mg/dL — ABNORMAL HIGH (ref 0.44–1.00)
GFR, EST AFRICAN AMERICAN: 43 mL/min — AB (ref >60)
GFR, EST NON AFRICAN AMERICAN: 37 mL/min — AB (ref >60)
GLUCOSE: 107 mg/dL — AB (ref 65–99)
Potassium: 5.2 mmol/L — ABNORMAL HIGH (ref 3.5–5.1)
Sodium: 127 mmol/L — ABNORMAL LOW (ref 135–145)

## 2016-09-27 LAB — GLUCOSE, CAPILLARY
GLUCOSE-CAPILLARY: 104 mg/dL — AB (ref 65–99)
GLUCOSE-CAPILLARY: 110 mg/dL — AB (ref 65–99)
Glucose-Capillary: 101 mg/dL — ABNORMAL HIGH (ref 65–99)
Glucose-Capillary: 97 mg/dL (ref 65–99)

## 2016-09-27 MED ORDER — FUROSEMIDE 20 MG PO TABS: 20.0000 mg | ORAL_TABLET | Freq: Every day | ORAL | Status: DC

## 2016-09-27 NOTE — Progress Notes (Signed)
Occupational Therapy Session Note  Patient Details  Name: Jodi Collins MRN: [id number] Date of Birth: 22-Nov-1950  Today's Date: 09/27/2016 OT Individual Time: 6222-9798 OT Individual Time Calculation (min): 58 min    Short Term Goals: Week 1:  OT Short Term Goal 1 (Week 1): STGs equal to LTGs set at modified independent overall.   Skilled Therapeutic Interventions/Progress Updates:    Pt received supine in bed, declining OOB/EOB activity but agreeable to bed level treatment session. Education provided on benefits of OOB activity with Pt verbalizing understanding. +2 assist to scoot Pt to [place], with HOB elevated Pt completed bil UE therapeutic exercise and RLE gentle AAROM. Pt completing there ex using level 1 theraband for 10 reps each exercise, alternating between each UE. Exercises included shoulder flexion/ext, abd/adduction, elbow flex/ext with min verbal cues for technique throughout. Pt completed bil uE therex using 1lb dowel rod including shoulder flex/ext, horizontal abd/adduction, elbow flex/ext, and minA to facilitate trunk flexion while reaching forward with dowel rod. Pt completed R LE AAROM knee flex/ext, ankle pumps and ankle rolls for 10 reps each x 3 sets throughout session (alternating between UE/LE). Pt requires frequent rest breaks between each exercise due to fatigue, however reports feeling better after session completion. Pt left supine in bed, call bell and needs within reach.   Therapy Documentation Precautions:  Precautions Precautions: Fall Precaution Comments: L AKA, monitor O2 and HR, low HGB Restrictions Weight Bearing Restrictions: Yes LLE Weight Bearing: Non weight bearing   Pain: Pain Assessment Pain Assessment: Faces Pain Score: 0-No pain Faces Pain Scale: Hurts little more Pain Type: Acute pain Pain Location: Leg Pain Orientation: Right Pain Descriptors / Indicators: Aching Pain Frequency: Constant Pain Onset: On-going Patients Stated  Pain Goal: 0 Pain Intervention(s): Repositioned;Rest  See Function Navigator for Current Functional Status.   Therapy/Group: Individual Therapy  Raymondo Band 09/27/2016, 5:21 PM

## 2016-09-27 NOTE — Plan of Care (Signed)
Problem: RH BOWEL ELIMINATION Goal: RH STG MANAGE BOWEL WITH ASSISTANCE STG Manage Bowel with  Mod Assistance.   Outcome: Progressing Total assistance

## 2016-09-27 NOTE — Progress Notes (Signed)
Occupational Therapy Session Note  Patient Details  Name: Jodi Collins MRN: [id number] Date of Birth: May 18, 1950  Today's Date: 09/27/2016 OT Individual Time: 0900-1003 OT Individual Time Calculation (min): 63 min    Short Term Goals: Week 1:  OT Short Term Goal 1 (Week 1): STGs equal to LTGs set at modified independent overall.   Skilled Therapeutic Interventions/Progress Updates:    Pt still with increased edema in BLEs at this time, with weeping noted out of the left residual limb.  Pt needing mod assist for transition from supine to sit EOB.  Once sitting had pt work on bathing and dressing tasks.  Pt with increased dyspnea with some anxiety with sitting EOB.  She needed increased time to complete bathing tasks for UB.  She was unable to reach down past her upper leg on the right side secondary to limited flexibility and increased edema.  Attempted sit to stand X 2 during session with first attempt requiring total assist in order for therapist to assist with washing buttocks.  Attempted to stand on second attempt but she was unable.  Transitioned to supine in order to complete washing and donning pants with total assist.  Nursing made aware of pt's current condition with increased edema an weeping out of the left wound and upper leg.  Nursing and pt re-wrapped LLE with pt left resting in bed with call button and phone in reach.    Therapy Documentation Precautions:  Precautions Precautions: Fall Precaution Comments: L AKA, monitor O2 and HR, low HGB Restrictions Weight Bearing Restrictions: Yes LLE Weight Bearing: Non weight bearing  Pain: Pain Assessment Pain Assessment: Faces Faces Pain Scale: Hurts little more Pain Type: Acute pain Pain Location: Leg Pain Orientation: Right Pain Descriptors / Indicators: Burning Pain Onset: With Activity Pain Intervention(s): Repositioned;Medication (See eMAR) ADL:  See Function Navigator for Current Functional  Status.   Therapy/Group: Individual Therapy  Akeria Hedstrom OTR/L 09/27/2016, 12:45 PM

## 2016-09-27 NOTE — Progress Notes (Signed)
Physical Therapy Session Note  Patient Details  Name: Jodi Collins MRN: [id number] Date of Birth: [birth year]  Today's Date: 09/27/2016 PT Individual Time: 3086-5784 PT Individual Time Calculation (min): 24 min   Short Term Goals: Week 1:  PT Short Term Goal 1 (Week 1): Pt will ambulate 25 ft with supervision and RW. PT Short Term Goal 2 (Week 1): Pt will perform supine to sit and sit to supine with mod I. PT Short Term Goal 3 (Week 1): Pt will perform sit to stand and bed to chair with supervision.  Skilled Therapeutic Interventions/Progress Updates:    PT supine asleep upon PT arrival, easily aroused but reported that she felt to tired to do therapy this session. PT asked if she could at least try to sit edge of the bed, pt agreeable. Pt transferred from supine to sitting EOB with moderate assist in order to help lift/lower LEs and to help bring trunk forward. Worked on upright sitting tolerance x 5 min, verbal cues for breathing. Pt stated she needed to lie down. Pt transferred from sitting EOB to supine with Mod assist to lift LEs. PT asked if she could try some LE exercises in bed, pt was able to perform x 2 heels slides with her R LE but then stated her legs feel like "lead" and are to heavy, she also reported pain 6/10. Pt requested to rest. Pt left supine in bed with call bell in reach.   Therapy Documentation Precautions:  Precautions Precautions: Fall Precaution Comments: L AKA, monitor O2 and HR, low HGB Restrictions Weight Bearing Restrictions: Yes LLE Weight Bearing: Non weight bearing General: PT Amount of Missed Time (min): 36 Minutes PT Missed Treatment Reason: Patient fatigue;Patient unwilling to participate   See Function Navigator for Current Functional Status.   Therapy/Group: Individual Therapy  Netta Corrigan, PT, DPT 09/27/2016, 11:38 AM

## 2016-09-27 NOTE — Progress Notes (Signed)
[facility]     PROGRESS NOTE  Subjective/Complaints:  Pt seen laying in bed this AM.  She slept well overnight.    ROS: Denies CP, SOB, N/V/D.  Objective: Vital Signs: Blood pressure (!) 96/56, pulse (!) 102, temperature (!) 97.5 F (36.4 C), temperature source Oral, resp. rate 18, height 5\' 2"  (1.575 m), weight 70.3 kg (154 lb 15.7 oz), SpO2 100 %. Dg Chest 2 View  Result Date: 09/25/2016 CLINICAL DATA:  Body edema. EXAM: CHEST  2 VIEW COMPARISON:  Chest x-ray dated 09/18/2016 FINDINGS: Heart size and pulmonary vascularity are normal. Right lung is clear. Slight compressive atelectasis in the left lung due to large diaphragmatic hernia. Power port in place, unchanged. Bones are normal. IMPRESSION: No acute abnormality. 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 Electronically Signed   By: Lorriane Shire M.D.   On: 09/25/2016 08:33   US Renal  Result Date: 09/26/2016 CLINICAL DATA:  Urinary retention. EXAM: RENAL / URINARY TRACT ULTRASOUND COMPLETE COMPARISON:  CT scan dated 08/28/2016 FINDINGS: Right Kidney: Length: 8.3 cm. Echogenicity within normal limits. No mass or hydronephrosis visualized. Left Kidney: Length: 9.6 cm. Echogenicity within normal limits. No mass or hydronephrosis visualized. Bladder: The bladder is empty with a Foley catheter in place. Incidental note is made of ascites. Extensive sludge in the gallbladder. IMPRESSION: 1. Normal appearing kidneys. 2. The bladder is empty with a Foley catheter in place. 3. Ascites. 4. Sludge in the gallbladder. Electronically Signed   By: Lorriane Shire M.D.   On: 09/26/2016 11:06    Recent Labs  09/26/16 0442 09/27/16 0415  WBC 12.8* 12.2*  HGB 7.2* 6.4*  HCT 23.1* 20.0*  PLT 113* 120*    Recent Labs  09/26/16 0442 09/27/16 0415  NA 129* 127*  K 5.1 5.2*  CL 102 100*  GLUCOSE 102* 107*  BUN 47* 56*  CREATININE 1.15* 1.45*  CALCIUM 8.2*  7.9*   CBG (last 3)   Recent Labs  09/26/16 1650 09/26/16 2051 09/27/16 0637  GLUCAP 99 101* 101*    Wt Readings from Last 3 Encounters:  09/26/16 70.3 kg (154 lb 15.7 oz)  09/18/16 63.5 kg (140 lb)  09/12/16 63.5 kg (140 lb)    Physical Exam:  BP (!) 96/56 (BP Location: Left Arm)   Pulse (!) 102   Temp (!) 97.5 F (36.4 C) (Oral)   Resp 18   Ht 5\' 2"  (1.575 m)   Wt 70.3 kg (154 lb 15.7 oz)   LMP  (LMP Unknown)   SpO2 100%   BMI 28.35 kg/m  Constitutional: She appears well-developed. NAD. HENT: Normocephalic. Atraumatic Eyes: EOM are normal. No discharge.  Cardiovascular: +Tachycardia. Regular rhythm. No JVD.  Respiratory: CTA Bilaterally. Increased WOB.   GI: Soft. Bowel sounds are normal.  Musc: Edema. No tenderness Neurological: She is alert and oriented.  Motor: B/L UE 5/5 proximal to distal RLE: 4+/5 proximal to distal LLE: 4/5 HF Psychiatric: Pleasant and cooperative. Normal mood and behavior Skin. Amputation intact with staples c/d/i  Assessment/Plan: 1. 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 Physiatrist is providing close team supervision and 24 hour management of active medical problems listed below. Physiatrist and rehab team continue to assess barriers to discharge/monitor patient progress toward functional and medical goals.  Function:  Bathing Bathing position   Position: Sitting EOB  Bathing parts  Body parts bathed by patient: Right arm, Left arm, Chest, Abdomen, Right upper leg, Left upper leg, Front perineal area Body parts bathed by helper: Right lower leg, Buttocks, Back  Bathing assist Assist Level:  (78%, min assist)      Upper Body Dressing/Undressing Upper body dressing   What is the patient wearing?: Pull over shirt/dress     Pull over shirt/dress - Perfomed by patient: Put head through opening          Upper body assist  Assist Level:  (1/4, 25%, max assist)      Lower Body Dressing/Undressing Lower body dressing   What is the patient wearing?: Pants     Pants- Performed by patient: Thread/unthread left pants leg Pants- Performed by helper: Thread/unthread right pants leg, Pull pants up/down   Non-skid slipper socks- Performed by helper: Don/doff right sock                  Lower body assist Assist for lower body dressing: Touching or steadying assistance (Pt > 75%)      Toileting Toileting Toileting activity did not occur: Safety/medical concerns Toileting steps completed by patient: Adjust clothing prior to toileting Toileting steps completed by helper: Adjust clothing prior to toileting, Performs perineal hygiene, Adjust clothing after toileting Toileting Assistive Devices: Toilet aid  Toileting assist Assist level: Two helpers   Transfers Chair/bed transfer Chair/bed transfer activity did not occur: Safety/medical concerns Chair/bed transfer method: Stand pivot Chair/bed transfer assist level: Moderate assist (Pt 50 - 74%/lift or lower) Chair/bed transfer assistive device: Armrests, Medical sales representative     Max distance: 6' Assist level: Touching or steadying assistance (Pt > 75%)   Wheelchair   Type: Manual Max wheelchair distance: 125 ft Assist Level: Supervision or verbal cues  Cognition Comprehension Comprehension assist level: Follows complex conversation/direction with no assist  Expression Expression assist level: Expresses complex ideas: With extra time/assistive device  Social Interaction Social Interaction assist level: Interacts appropriately with others - No medications needed.  Problem Solving Problem solving assist level: Solves complex problems: Recognizes & self-corrects  Memory Memory assist level: Recognizes or recalls 90% of the time/requires cueing < 10% of the time    Medical Problem List and Plan:  1. Decreased functional mobility secondary  to left AKA 09/18/2016   Cont CIR 2. DVT Prophylaxis/Anticoagulation: SCD RLE. 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 Patient does have an IVC filter.  3. Pain Management: Hydrocodone as needed  4. Mood: Provide emotional support  5. Neuropsych: This patient is capable of making decisions on her own behalf.  6. Skin/Wound Care: Routine skin checks  7. Fluids/Electrolytes/Nutrition: Routine I&Os 8. Acute blood loss anemia.   hgb 6.4 on 7/11  Cont iron supplementation   Will consult Heme/Onc, likely transfusion today  May need GI consult as well 9. Hyperkalemia. Resolved.  10. History of gastric/stomach cancer. Follow-up outpatient Dr. Mila Merry  11. Leukocytosis  WBCs 12.2 on 7/11  Cont to monitor 12. Hyperglycemia  Likely stress induced  Overall controlled 7/11  Cont to monitor 13. Hyponatremia  Na 127 on 7/11  Cont to monitor 14. Thrombocytopenia  Plts 120 on 7/11  Cont to monitor 15. Urinary retention   Unable to remove fluid with I/O, foley ordered, d/ced 7/11  CXR reviewed, unremarkable for fluid  Cont Lasix, decreased 7/11  BNP WNL  Kidney U/S showing ascities, gall bladder sludge 16. AKI  Cr.  1.45 on 7/11  Likely secondary to diuretic, decreased 7/11  IVF started, increased  Encourage fluids 17. Ascites  Likely related to #10  Likely will need Tap  Wil consult Heme/Onc   LOS (Days) 6 A FACE TO FACE EVALUATION WAS PERFORMED  Araf Clugston Lorie Phenix 09/27/2016 8:01 AM

## 2016-09-28 ENCOUNTER — Inpatient Hospital Stay (HOSPITAL_COMMUNITY): Payer: 59 | Admitting: Occupational Therapy

## 2016-09-28 ENCOUNTER — Encounter (HOSPITAL_COMMUNITY): Payer: Self-pay | Admitting: Emergency Medicine

## 2016-09-28 ENCOUNTER — Other Ambulatory Visit: Payer: Self-pay | Admitting: Oncology

## 2016-09-28 ENCOUNTER — Inpatient Hospital Stay (HOSPITAL_COMMUNITY): Payer: 59

## 2016-09-28 ENCOUNTER — Inpatient Hospital Stay (HOSPITAL_COMMUNITY)
Admission: AD | Admit: 2016-09-28 | Discharge: 2016-10-18 | DRG: 682 | Disposition: E | Payer: 59 | Source: Ambulatory Visit | Attending: Pulmonary Disease | Admitting: Pulmonary Disease

## 2016-09-28 DIAGNOSIS — R402363 Coma scale, best motor response, obeys commands, at hospital admission: Secondary | ICD-10-CM | POA: Diagnosis present

## 2016-09-28 DIAGNOSIS — R0603 Acute respiratory distress: Secondary | ICD-10-CM

## 2016-09-28 DIAGNOSIS — D62 Acute posthemorrhagic anemia: Secondary | ICD-10-CM | POA: Diagnosis present

## 2016-09-28 DIAGNOSIS — E871 Hypo-osmolality and hyponatremia: Secondary | ICD-10-CM | POA: Diagnosis present

## 2016-09-28 DIAGNOSIS — F4323 Adjustment disorder with mixed anxiety and depressed mood: Secondary | ICD-10-CM | POA: Diagnosis not present

## 2016-09-28 DIAGNOSIS — D6869 Other thrombophilia: Secondary | ICD-10-CM | POA: Diagnosis present

## 2016-09-28 DIAGNOSIS — Z66 Do not resuscitate: Secondary | ICD-10-CM | POA: Diagnosis not present

## 2016-09-28 DIAGNOSIS — Z89612 Acquired absence of left leg above knee: Secondary | ICD-10-CM

## 2016-09-28 DIAGNOSIS — G9340 Encephalopathy, unspecified: Secondary | ICD-10-CM | POA: Diagnosis not present

## 2016-09-28 DIAGNOSIS — I2609 Other pulmonary embolism with acute cor pulmonale: Secondary | ICD-10-CM | POA: Diagnosis not present

## 2016-09-28 DIAGNOSIS — E1151 Type 2 diabetes mellitus with diabetic peripheral angiopathy without gangrene: Secondary | ICD-10-CM | POA: Diagnosis present

## 2016-09-28 DIAGNOSIS — R402253 Coma scale, best verbal response, oriented, at hospital admission: Secondary | ICD-10-CM | POA: Diagnosis present

## 2016-09-28 DIAGNOSIS — N170 Acute kidney failure with tubular necrosis: Secondary | ICD-10-CM | POA: Diagnosis present

## 2016-09-28 DIAGNOSIS — E875 Hyperkalemia: Secondary | ICD-10-CM | POA: Diagnosis present

## 2016-09-28 DIAGNOSIS — E43 Unspecified severe protein-calorie malnutrition: Secondary | ICD-10-CM | POA: Diagnosis present

## 2016-09-28 DIAGNOSIS — Z86718 Personal history of other venous thrombosis and embolism: Secondary | ICD-10-CM

## 2016-09-28 DIAGNOSIS — C786 Secondary malignant neoplasm of retroperitoneum and peritoneum: Secondary | ICD-10-CM | POA: Diagnosis present

## 2016-09-28 DIAGNOSIS — Z8249 Family history of ischemic heart disease and other diseases of the circulatory system: Secondary | ICD-10-CM

## 2016-09-28 DIAGNOSIS — N179 Acute kidney failure, unspecified: Secondary | ICD-10-CM | POA: Diagnosis present

## 2016-09-28 DIAGNOSIS — Z6827 Body mass index (BMI) 27.0-27.9, adult: Secondary | ICD-10-CM

## 2016-09-28 DIAGNOSIS — R0600 Dyspnea, unspecified: Secondary | ICD-10-CM | POA: Diagnosis not present

## 2016-09-28 DIAGNOSIS — Z86711 Personal history of pulmonary embolism: Secondary | ICD-10-CM | POA: Diagnosis not present

## 2016-09-28 DIAGNOSIS — J810 Acute pulmonary edema: Secondary | ICD-10-CM | POA: Diagnosis not present

## 2016-09-28 DIAGNOSIS — J9600 Acute respiratory failure, unspecified whether with hypoxia or hypercapnia: Secondary | ICD-10-CM | POA: Diagnosis not present

## 2016-09-28 DIAGNOSIS — E8770 Fluid overload, unspecified: Secondary | ICD-10-CM | POA: Diagnosis present

## 2016-09-28 DIAGNOSIS — J9690 Respiratory failure, unspecified, unspecified whether with hypoxia or hypercapnia: Secondary | ICD-10-CM | POA: Diagnosis not present

## 2016-09-28 DIAGNOSIS — I959 Hypotension, unspecified: Secondary | ICD-10-CM | POA: Diagnosis present

## 2016-09-28 DIAGNOSIS — Z9289 Personal history of other medical treatment: Secondary | ICD-10-CM

## 2016-09-28 DIAGNOSIS — R601 Generalized edema: Secondary | ICD-10-CM | POA: Diagnosis not present

## 2016-09-28 DIAGNOSIS — D696 Thrombocytopenia, unspecified: Secondary | ICD-10-CM | POA: Diagnosis present

## 2016-09-28 DIAGNOSIS — K922 Gastrointestinal hemorrhage, unspecified: Secondary | ICD-10-CM | POA: Diagnosis present

## 2016-09-28 DIAGNOSIS — D500 Iron deficiency anemia secondary to blood loss (chronic): Secondary | ICD-10-CM | POA: Diagnosis present

## 2016-09-28 DIAGNOSIS — D630 Anemia in neoplastic disease: Secondary | ICD-10-CM

## 2016-09-28 DIAGNOSIS — E860 Dehydration: Secondary | ICD-10-CM | POA: Diagnosis present

## 2016-09-28 DIAGNOSIS — R402143 Coma scale, eyes open, spontaneous, at hospital admission: Secondary | ICD-10-CM | POA: Diagnosis present

## 2016-09-28 DIAGNOSIS — Z833 Family history of diabetes mellitus: Secondary | ICD-10-CM

## 2016-09-28 DIAGNOSIS — J9601 Acute respiratory failure with hypoxia: Secondary | ICD-10-CM | POA: Diagnosis not present

## 2016-09-28 DIAGNOSIS — Z4781 Encounter for orthopedic aftercare following surgical amputation: Secondary | ICD-10-CM | POA: Diagnosis not present

## 2016-09-28 DIAGNOSIS — C169 Malignant neoplasm of stomach, unspecified: Secondary | ICD-10-CM | POA: Diagnosis present

## 2016-09-28 DIAGNOSIS — I2782 Chronic pulmonary embolism: Secondary | ICD-10-CM | POA: Diagnosis not present

## 2016-09-28 DIAGNOSIS — Z800 Family history of malignant neoplasm of digestive organs: Secondary | ICD-10-CM

## 2016-09-28 DIAGNOSIS — R180 Malignant ascites: Secondary | ICD-10-CM | POA: Diagnosis not present

## 2016-09-28 DIAGNOSIS — Z515 Encounter for palliative care: Secondary | ICD-10-CM | POA: Diagnosis not present

## 2016-09-28 DIAGNOSIS — E11649 Type 2 diabetes mellitus with hypoglycemia without coma: Secondary | ICD-10-CM | POA: Diagnosis present

## 2016-09-28 LAB — SODIUM, URINE, RANDOM: Sodium, Ur: 10 mmol/L

## 2016-09-28 LAB — BASIC METABOLIC PANEL
ANION GAP: 11 (ref 5–15)
ANION GAP: 10 (ref 5–15)
BUN: 62 mg/dL — ABNORMAL HIGH (ref 6–20)
BUN: 62 mg/dL — ABNORMAL HIGH (ref 6–20)
CALCIUM: 7.8 mg/dL — AB (ref 8.9–10.3)
CALCIUM: 7.9 mg/dL — AB (ref 8.9–10.3)
CO2: 17 mmol/L — ABNORMAL LOW (ref 22–32)
CO2: 17 mmol/L — AB (ref 22–32)
CREATININE: 1.87 mg/dL — AB (ref 0.44–1.00)
CREATININE: 1.86 mg/dL — AB (ref 0.44–1.00)
Chloride: 100 mmol/L — ABNORMAL LOW (ref 101–111)
Chloride: 102 mmol/L (ref 101–111)
GFR, EST AFRICAN AMERICAN: 32 mL/min — AB (ref >60)
GFR, EST AFRICAN AMERICAN: 32 mL/min — AB (ref >60)
GFR, EST NON AFRICAN AMERICAN: 27 mL/min — AB (ref >60)
GFR, EST NON AFRICAN AMERICAN: 28 mL/min — AB (ref >60)
GLUCOSE: 97 mg/dL (ref 65–99)
Glucose, Bld: 127 mg/dL — ABNORMAL HIGH (ref 65–99)
Potassium: 5.3 mmol/L — ABNORMAL HIGH (ref 3.5–5.1)
Potassium: 5.6 mmol/L — ABNORMAL HIGH (ref 3.5–5.1)
SODIUM: 129 mmol/L — AB (ref 135–145)
Sodium: 128 mmol/L — ABNORMAL LOW (ref 135–145)

## 2016-09-28 LAB — CREATININE, URINE, RANDOM: CREATININE, URINE: 142.36 mg/dL

## 2016-09-28 LAB — BLOOD GAS, ARTERIAL
ACID-BASE DEFICIT: 9.2 mmol/L — AB (ref 0.0–2.0)
Acid-base deficit: 9.2 mmol/L — ABNORMAL HIGH (ref 0.0–2.0)
BICARBONATE: 17.2 mmol/L — AB (ref 20.0–28.0)
Bicarbonate: 15.5 mmol/L — ABNORMAL LOW (ref 20.0–28.0)
Drawn by: 24486
Drawn by: 511841
FIO2: 100
FIO2: 1
LHR: 16 {breaths}/min
O2 Saturation: 99.4 %
O2 Saturation: 93.2 %
PATIENT TEMPERATURE: 97.5
PCO2 ART: 29.2 mmHg — AB (ref 32.0–48.0)
PCO2 ART: 43.6 mmHg (ref 32.0–48.0)
PEEP/CPAP: 5 cmH2O
PH ART: 7.341 — AB (ref 7.350–7.450)
PO2 ART: 79.9 mmHg — AB (ref 83.0–108.0)
Patient temperature: 97.6
VT: 420 mL
pH, Arterial: 7.217 — ABNORMAL LOW (ref 7.350–7.450)
pO2, Arterial: 184 mmHg — ABNORMAL HIGH (ref 83.0–108.0)

## 2016-09-28 LAB — CBC WITH DIFFERENTIAL/PLATELET
BASOS ABS: 0 10*3/uL (ref 0.0–0.1)
BASOS PCT: 0 %
EOS PCT: 0 %
Eosinophils Absolute: 0 10*3/uL (ref 0.0–0.7)
HCT: 20.2 % — ABNORMAL LOW (ref 36.0–46.0)
Hemoglobin: 6.3 g/dL — CL (ref 12.0–15.0)
Lymphocytes Relative: 12 %
Lymphs Abs: 1.6 10*3/uL (ref 0.7–4.0)
MCH: 26.4 pg (ref 26.0–34.0)
MCHC: 31.2 g/dL (ref 30.0–36.0)
MCV: 84.5 fL (ref 78.0–100.0)
MONO ABS: 0.5 10*3/uL (ref 0.1–1.0)
Monocytes Relative: 4 %
Neutro Abs: 11 10*3/uL — ABNORMAL HIGH (ref 1.7–7.7)
Neutrophils Relative %: 84 %
PLATELETS: 119 10*3/uL — AB (ref 150–400)
RBC: 2.39 MIL/uL — ABNORMAL LOW (ref 3.87–5.11)
RDW: 18.1 % — AB (ref 11.5–15.5)
WBC: 13.1 10*3/uL — ABNORMAL HIGH (ref 4.0–10.5)

## 2016-09-28 LAB — CBC
HEMATOCRIT: 31.7 % — AB (ref 36.0–46.0)
HEMOGLOBIN: 10.2 g/dL — AB (ref 12.0–15.0)
MCH: 27.3 pg (ref 26.0–34.0)
MCHC: 32.2 g/dL (ref 30.0–36.0)
MCV: 84.8 fL (ref 78.0–100.0)
Platelets: 89 10*3/uL — ABNORMAL LOW (ref 150–400)
RBC: 3.74 MIL/uL — AB (ref 3.87–5.11)
RDW: 16.7 % — ABNORMAL HIGH (ref 11.5–15.5)
WBC: 18.8 10*3/uL — ABNORMAL HIGH (ref 4.0–10.5)

## 2016-09-28 LAB — PROTEIN / CREATININE RATIO, URINE
Creatinine, Urine: 48.52 mg/dL
Protein Creatinine Ratio: 1.07 mg/mg{Cre} — ABNORMAL HIGH (ref 0.00–0.15)
TOTAL PROTEIN, URINE: 52 mg/dL

## 2016-09-28 LAB — URINALYSIS, ROUTINE W REFLEX MICROSCOPIC
Bilirubin Urine: NEGATIVE
GLUCOSE, UA: NEGATIVE mg/dL
KETONES UR: NEGATIVE mg/dL
NITRITE: NEGATIVE
PH: 5 (ref 5.0–8.0)
Protein, ur: 30 mg/dL — AB
SPECIFIC GRAVITY, URINE: 1.021 (ref 1.005–1.030)

## 2016-09-28 LAB — PREPARE RBC (CROSSMATCH)

## 2016-09-28 LAB — GLUCOSE, CAPILLARY
GLUCOSE-CAPILLARY: 124 mg/dL — AB (ref 65–99)
Glucose-Capillary: 102 mg/dL — ABNORMAL HIGH (ref 65–99)
Glucose-Capillary: 116 mg/dL — ABNORMAL HIGH (ref 65–99)
Glucose-Capillary: 94 mg/dL (ref 65–99)

## 2016-09-28 LAB — MRSA PCR SCREENING: MRSA by PCR: NEGATIVE

## 2016-09-28 LAB — PROCALCITONIN: Procalcitonin: 6.39 ng/mL

## 2016-09-28 LAB — OSMOLALITY, URINE: Osmolality, Ur: 550 mOsm/kg (ref 300–900)

## 2016-09-28 LAB — CK: CK TOTAL: 212 U/L (ref 38–234)

## 2016-09-28 LAB — CORTISOL: Cortisol, Plasma: 64.4 ug/dL

## 2016-09-28 LAB — OSMOLALITY: OSMOLALITY: 298 mosm/kg — AB (ref 275–295)

## 2016-09-28 MED ORDER — VANCOMYCIN HCL 10 G IV SOLR
1500.0000 mg | Freq: Once | INTRAVENOUS | Status: AC
  Administered 2016-09-28: 1500 mg via INTRAVENOUS
  Filled 2016-09-28: qty 1500

## 2016-09-28 MED ORDER — FUROSEMIDE 10 MG/ML IJ SOLN: 40.0000 mg | Freq: Once | INTRAMUSCULAR | Status: DC

## 2016-09-28 MED ORDER — LORAZEPAM 0.5 MG PO TABS
0.5000 mg | ORAL_TABLET | Freq: Four times a day (QID) | ORAL | Status: DC | PRN
  Filled 2016-09-28: qty 1

## 2016-09-28 MED ORDER — POLYETHYLENE GLYCOL 3350 17 GM/SCOOP PO POWD
17.0000 g | Freq: Every day | ORAL | Status: DC
  Filled 2016-09-28: qty 255

## 2016-09-28 MED ORDER — ORAL CARE MOUTH RINSE
15.0000 mL | Freq: Four times a day (QID) | OROMUCOSAL | Status: DC
  Administered 2016-09-28 – 2016-09-29 (×3): 15 mL via OROMUCOSAL

## 2016-09-28 MED ORDER — SENNOSIDES-DOCUSATE SODIUM 8.6-50 MG PO TABS
1.0000 | ORAL_TABLET | Freq: Every evening | ORAL | Status: DC | PRN
  Filled 2016-09-28: qty 1

## 2016-09-28 MED ORDER — OXYCODONE-ACETAMINOPHEN 5-325 MG PO TABS: 1.0000 | ORAL_TABLET | ORAL | Status: DC | PRN

## 2016-09-28 MED ORDER — PIPERACILLIN-TAZOBACTAM 3.375 G IVPB
3.3750 g | Freq: Three times a day (TID) | INTRAVENOUS | Status: DC
  Administered 2016-09-28 – 2016-09-29 (×2): 3.375 g via INTRAVENOUS
  Filled 2016-09-28 (×3): qty 50

## 2016-09-28 MED ORDER — CHLORHEXIDINE GLUCONATE 0.12% ORAL RINSE (MEDLINE KIT)
15.0000 mL | Freq: Two times a day (BID) | OROMUCOSAL | Status: DC
  Administered 2016-09-28 – 2016-09-29 (×2): 15 mL via OROMUCOSAL

## 2016-09-28 MED ORDER — FENTANYL CITRATE (PF) 100 MCG/2ML IJ SOLN
INTRAMUSCULAR | Status: AC
  Administered 2016-09-28: 17:00:00
  Filled 2016-09-28: qty 2

## 2016-09-28 MED ORDER — FUROSEMIDE 10 MG/ML IJ SOLN
80.0000 mg | Freq: Two times a day (BID) | INTRAMUSCULAR | Status: DC
  Administered 2016-09-28 – 2016-09-29 (×2): 80 mg via INTRAVENOUS
  Filled 2016-09-28 (×4): qty 8

## 2016-09-28 MED ORDER — POLYETHYLENE GLYCOL 3350 17 G PO PACK
17.0000 g | PACK | Freq: Every day | ORAL | Status: DC
  Administered 2016-09-29: 17 g via ORAL
  Filled 2016-09-28: qty 1

## 2016-09-28 MED ORDER — SODIUM CHLORIDE 0.9% FLUSH
3.0000 mL | Freq: Two times a day (BID) | INTRAVENOUS | Status: DC
  Administered 2016-09-29: 3 mL via INTRAVENOUS

## 2016-09-28 MED ORDER — NALOXONE HCL 0.4 MG/ML IJ SOLN
INTRAMUSCULAR | Status: AC
Start: 2016-09-28 — End: 2016-09-28
  Administered 2016-09-28: 17:00:00
  Filled 2016-09-28: qty 1

## 2016-09-28 MED ORDER — ENSURE ENLIVE PO LIQD: 237.0000 mL | Freq: Two times a day (BID) | ORAL | Status: DC

## 2016-09-28 MED ORDER — VANCOMYCIN HCL IN DEXTROSE 750-5 MG/150ML-% IV SOLN
750.0000 mg | INTRAVENOUS | Status: DC
  Filled 2016-09-28: qty 150

## 2016-09-28 MED ORDER — SODIUM CHLORIDE 0.9 % IV SOLN: Freq: Once | INTRAVENOUS | Status: DC

## 2016-09-28 MED ORDER — NALOXONE HCL 0.4 MG/ML IJ SOLN
INTRAMUSCULAR | Status: AC
  Administered 2016-09-28: 17:00:00
  Filled 2016-09-28: qty 1

## 2016-09-28 MED ORDER — NALOXONE HCL 2 MG/2ML IJ SOSY
1.0000 mg/h | PREFILLED_SYRINGE | INTRAVENOUS | Status: DC
  Filled 2016-09-28 (×2): qty 4

## 2016-09-28 MED ORDER — SODIUM POLYSTYRENE SULFONATE 15 GM/60ML PO SUSP: 30.0000 g | Freq: Once | ORAL | Status: DC

## 2016-09-28 MED ORDER — HYDROCORTISONE NA SUCCINATE PF 100 MG IJ SOLR
50.0000 mg | Freq: Four times a day (QID) | INTRAMUSCULAR | Status: DC
  Administered 2016-09-28 – 2016-09-29 (×4): 50 mg via INTRAVENOUS
  Filled 2016-09-28: qty 1
  Filled 2016-09-28: qty 2
  Filled 2016-09-28 (×2): qty 1
  Filled 2016-09-28: qty 2
  Filled 2016-09-28: qty 1

## 2016-09-28 MED ORDER — SODIUM CHLORIDE 0.9 % IV SOLN
Freq: Once | INTRAVENOUS | Status: AC
  Administered 2016-09-28: 19:00:00 via INTRAVENOUS

## 2016-09-28 MED ORDER — ONDANSETRON HCL 4 MG/2ML IJ SOLN: 4.0000 mg | Freq: Four times a day (QID) | INTRAMUSCULAR | Status: DC | PRN

## 2016-09-28 MED ORDER — MIDAZOLAM HCL 2 MG/2ML IJ SOLN
INTRAMUSCULAR | Status: AC
  Administered 2016-09-28: 17:00:00
  Filled 2016-09-28: qty 2

## 2016-09-28 MED ORDER — SODIUM POLYSTYRENE SULFONATE 15 GM/60ML PO SUSP
30.0000 g | Freq: Once | ORAL | Status: DC
  Filled 2016-09-28: qty 120

## 2016-09-28 MED ORDER — ACETAMINOPHEN 650 MG RE SUPP: 650.0000 mg | Freq: Four times a day (QID) | RECTAL | Status: DC | PRN

## 2016-09-28 MED ORDER — ACETAMINOPHEN 325 MG PO TABS: 650.0000 mg | ORAL_TABLET | Freq: Four times a day (QID) | ORAL | Status: DC | PRN

## 2016-09-28 MED ORDER — FENTANYL CITRATE (PF) 100 MCG/2ML IJ SOLN
25.0000 ug | INTRAMUSCULAR | Status: DC | PRN
  Administered 2016-09-29 (×2): 25 ug via INTRAVENOUS
  Filled 2016-09-28 (×2): qty 2

## 2016-09-28 MED ORDER — FUROSEMIDE 10 MG/ML IJ SOLN
40.0000 mg | Freq: Once | INTRAMUSCULAR | Status: AC
  Administered 2016-09-28: 40 mg via INTRAVENOUS
  Filled 2016-09-28: qty 4

## 2016-09-28 MED ORDER — ONDANSETRON HCL 4 MG PO TABS: 4.0000 mg | ORAL_TABLET | Freq: Four times a day (QID) | ORAL | Status: DC | PRN

## 2016-09-28 MED ORDER — MIDAZOLAM HCL 2 MG/2ML IJ SOLN: 1.0000 mg | INTRAMUSCULAR | Status: DC | PRN

## 2016-09-28 MED ORDER — SODIUM CHLORIDE 0.9% FLUSH: 3.0000 mL | Freq: Two times a day (BID) | INTRAVENOUS | Status: DC

## 2016-09-28 NOTE — Progress Notes (Signed)
[place]  Telephone:(336) [place]  DOB: [birth year]  MR#: [id number]  CSN#: [id number]    Requesting Physician: [facility]  Patient Care Team: Glendale Chard, MD as PCP - General (Internal Medicine) Carol Ada, MD as Consulting Physician (Gastroenterology) Ladell Pier, MD as Consulting Physician (Oncology) Magrinat, Virgie Dad, MD as Consulting Physician (Oncology)  Reason for consult: gastric cancer and anemia  History of present illness:  Jodi Collins is a 66 y.o. woman with a recent diagnosis of metastatic gastric cancer, was seen by my colleague Dr. Talbert Cage, I was called to f/u pt in hospital. Pt developed worsening lethargy, dyspnea, was intubated and transferred to MICU when I saw patient. Patient was not able to provide any history. Chart reviewed.   Pt was admitted and diagnosed with PE and DVT in early June 2018, she subsequently presented with severe anemia on Xarelto. An EGD obtained on 08/30/2016 was significant for a large mass with ulcerations. Biopsy showed adenocarcinoma. Her staging workup showed peritoneal metastasis. Xarelto was stopped due to GI bleeding, and she had IVC. Placed. She was seen by Dr. sell in office on 09/06/2016, palliative chemotherapy was offered, but has not started due to her readmission.  She suffered with loss of blood supply to her lower left lower extremity which required amputation on 09/18/2016. She was transferred to rehabilitation a few days ago, we will called to see her in the hospital for follow-up and manage her severe anemia.  Upon my visit, patient was sedated and being intubated.   MEDICAL HISTORY:  Past Medical History:  Diagnosis Date  . Anemia   . DVT (deep venous thrombosis) ([place]) 08/17/2016  . 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 GI bleed   . Hyperkalemia   . Hyponatremia   . PE (pulmonary  thromboembolism) ([employer]) 08/28/2016  . Stomach cancer ([place])    stage IV, dx 08/30/16  . Thrombocytopenia ([place])     SURGICAL HISTORY: Past Surgical History:  Procedure Laterality Date  . AMPUTATION Left 09/18/2016   Procedure: AMPUTATION ABOVE KNEE-LEFT;  Surgeon: Elam Dutch, MD;  Location: [facility] OR;  Service: Vascular;  Laterality: Left;  . CERVICAL CONIZATION W/BX  2012   high grade squamous intraepithelial dysplasia.  Dr Garwin Brothers.   . COLONOSCOPY W/ POLYPECTOMY  10/2006 and 10/2011   Dr Collene Mares.  had adenomatous polyps.    . ESOPHAGOGASTRODUODENOSCOPY N/A 08/30/2016   Procedure: ESOPHAGOGASTRODUODENOSCOPY (EGD);  Surgeon: Carol Ada, MD;  Location: [place];  Service: Gastroenterology;  Laterality: N/A;  . IR FLUORO GUIDE PORT INSERTION RIGHT  09/01/2016  . IR IVC FILTER PLMT / S&I /IMG GUID/MOD SED  08/29/2016  . IR US GUIDE VASC ACCESS RIGHT  09/01/2016    SOCIAL HISTORY: Social History   Social History  . Marital status: Legally Separated    Spouse name: N/A  . Number of children: N/A  . Years of education: N/A   Occupational History  . legal assistant    Social History Main Topics  . Smoking status: Never Smoker  . Smokeless tobacco: Never Used  . Alcohol use Yes     Comment: rarely  . Drug use: No  . Sexual activity: Not Currently   Other Topics Concern  . Not on file   Social History Narrative  . No narrative on file    FAMILY HISTORY: Family History  Problem Relation Age of Onset  .  Hypertension Mother   . Diabetes Mother     ALLERGIES:  is allergic to no known allergies.  MEDICATIONS:  Current Facility-Administered Medications  Medication Dose Route Frequency Provider Last Rate Last Dose  . acetaminophen (TYLENOL) tablet 650 mg  650 mg Oral Q6H PRN Radene Gunning, NP       Or  . acetaminophen (TYLENOL) suppository 650 mg  650 mg Rectal Q6H PRN Black, Lezlie Octave, NP      . chlorhexidine gluconate (MEDLINE KIT) (PERIDEX) 0.12 % solution 15 mL  15 mL  Mouth Rinse BID Rush Farmer, MD      . fentaNYL (SUBLIMAZE) injection 25-50 mcg  25-50 mcg Intravenous Q1H PRN Rush Farmer, MD      . furosemide (LASIX) injection 80 mg  80 mg Intravenous BID Donato Heinz, MD   80 mg at 10/17/2016 1851  . hydrocortisone sodium succinate (SOLU-CORTEF) 100 MG injection 50 mg  50 mg Intravenous Q6H Rush Farmer, MD   50 mg at 10/08/2016 1851  . [START ON 09/29/2016] MEDLINE mouth rinse  15 mL Mouth Rinse QID Rush Farmer, MD      . midazolam (VERSED) injection 1-2 mg  1-2 mg Intravenous Q2H PRN Rush Farmer, MD      . ondansetron [facility]) tablet 4 mg  4 mg Oral Q6H PRN Radene Gunning, NP       Or  . ondansetron [facility]) injection 4 mg  4 mg Intravenous Q6H PRN Radene Gunning, NP      . oxyCODONE-acetaminophen (PERCOCET/ROXICET) 5-325 MG per tablet 1-2 tablet  1-2 tablet Oral Q4H PRN Radene Gunning, NP      . piperacillin-tazobactam (ZOSYN) IVPB 3.375 g  3.375 g Intravenous Q8H Lyndee Leo, RPH 12.5 mL/hr at 09/29/2016 1839 3.375 g at 09/27/2016 1839  . [START ON 09/29/2016] polyethylene glycol (MIRALAX / GLYCOLAX) packet 17 g  17 g Oral Daily Masters, Jake Church, [facility]      . senna-docusate (Senokot-S) tablet 1 tablet  1 tablet Oral QHS PRN Black, Lezlie Octave, NP      . sodium chloride flush (NS) 0.9 % injection 3 mL  3 mL Intravenous Q12H Black, Karen M, NP      . sodium polystyrene (KAYEXALATE) 15 GM/60ML suspension 30 g  30 g Oral Once Radene Gunning, NP      . Derrill Memo ON 09/29/2016] vancomycin (VANCOCIN) IVPB 750 mg/150 ml premix  750 mg Intravenous Q24H Lyndee Leo, RPH        REVIEW OF SYSTEMS:   Not available due to her altered mental status and intubation  PHYSICAL EXAMINATION:  ECOG PERFORMANCE STATUS: 4 - Bedbound  Vitals:   09/23/2016 2100 10/14/2016 2130  BP: 99/81 98/79  Pulse:    Resp: (!) 23 (!) 26  Temp:     Filed Weights   09/25/2016 1800  Weight: 157 lb 10.1 oz (71.5 kg)    GENERAL: Sedated, intubated LUNGS: clear to  auscultation and percussion with normal breathing effort HEART: regular rate & rhythm and no murmurs and no lower extremity edema ABDOMEN:abdomen soft, non-tender and normal bowel sounds Musculoskeletal:s/p left AKA  LABORATORY DATA:  I have reviewed the data as listed Lab Results  Component Value Date   WBC 13.1 (H) 10/11/2016   HGB 6.3 (LL) 10/08/2016   HCT 20.2 (L) 09/26/2016   MCV 84.5 09/18/2016   PLT 119 (L) 10/02/2016    Recent Labs  09/08/16 1412  09/18/16  1035  09/19/16 0252  09/22/16 4854  09/26/16 0442 09/27/16 0415 10/16/2016 0426  NA  --   < > 131*  < > 133*  < > 133*  < > 129* 127* 128*  K  --   < > 6.0*  < > 5.4*  < > 3.8  < > 5.1 5.2* 5.3*  CL  --   < > 104  < > 107  < > 106  < > 102 100* 100*  CO2  --   < > 16*  < > 19*  < > 21*  < > 17* 17* 17*  GLUCOSE  --   < > 117*  < > 105*  < > 91  < > 102* 107* 97  BUN  --   < > 41*  < > 43*  < > 22*  < > 47* 56* 62*  CREATININE  --   < > 1.35*  < > 1.29*  < > 0.65  < > 1.15* 1.45* 1.87*  CALCIUM  --   < > 8.6*  < > 8.0*  < > 8.0*  < > 8.2* 7.9* 7.8*  GFRNONAA  --   < > 41*  < > 43*  < > >60  < > 49* 37* 27*  GFRAA  --   < > 47*  < > 50*  < > >60  < > 57* 43* 32*  PROT 6.1*  < > 6.4*  --  5.2*  --  5.6*  --   --   --   --   ALBUMIN 2.2*  < > 2.0*  --  1.9*  --  1.7*  --   --   --   --   AST 41  < > 36  --  40  --  44*  --   --   --   --   ALT 23  < > 30  --  20  --  29  --   --   --   --   ALKPHOS 77  < > 111  --  82  --  233*  --   --   --   --   BILITOT 0.3  < > 0.5  --  1.3*  --  0.9  --   --   --   --   BILIDIR 0.1  --   --   --   --   --   --   --   --   --   --   IBILI 0.2*  --   --   --   --   --   --   --   --   --   --   < > = values in this interval not displayed.  PATHOLOGY: Diagnosis 08/30/2016 Stomach, biopsy - INVASIVE POORLY DIFFERENTIATED CARCINOMA WITH FOCAL SIGNET RING CELL FEATURES. - SEE COMMENT.  RADIOGRAPHIC STUDIES: I have personally reviewed the radiological images as listed and agreed  with the findings in the report. Dg Chest 2 View  Result Date: 09/25/2016 CLINICAL DATA:  Body edema. EXAM: CHEST  2 VIEW COMPARISON:  Chest x-ray dated 09/18/2016 FINDINGS: Heart size and pulmonary vascularity are normal. Right lung is clear. Slight compressive atelectasis in the left lung due to large diaphragmatic hernia. Power port in place, unchanged. Bones are normal. IMPRESSION: No acute abnormality. 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 Electronically Signed  By: Lorriane Shire M.D.   On: 09/25/2016 08:33   US Renal  Result Date: 09/26/2016 CLINICAL DATA:  Urinary retention. EXAM: RENAL / URINARY TRACT ULTRASOUND COMPLETE COMPARISON:  CT scan dated 08/28/2016 FINDINGS: Right Kidney: Length: 8.3 cm. Echogenicity within normal limits. No mass or hydronephrosis visualized. Left Kidney: Length: 9.6 cm. Echogenicity within normal limits. No mass or hydronephrosis visualized. Bladder: The bladder is empty with a Foley catheter in place. Incidental note is made of ascites. Extensive sludge in the gallbladder. IMPRESSION: 1. Normal appearing kidneys. 2. The bladder is empty with a Foley catheter in place. 3. Ascites. 4. Sludge in the gallbladder. Electronically Signed   By: Lorriane Shire M.D.   On: 09/26/2016 11:06   Ir US Guide Vasc Access Right  Result Date: 09/01/2016 INDICATION: 66 year old female with a history of gastric carcinoma EXAM: IMPLANTED PORT A CATH PLACEMENT WITH ULTRASOUND AND FLUOROSCOPIC GUIDANCE MEDICATIONS: 2.0 g Ancef; The antibiotic was administered within an appropriate time interval prior to skin puncture. ANESTHESIA/SEDATION: Moderate (conscious) sedation was employed during this procedure. A total of Versed 1.0 mg and Fentanyl 25 mcg was administered intravenously. Moderate Sedation Time: 15 minutes. The patient's level of consciousness and vital signs were monitored continuously by radiology nursing throughout the  procedure under my direct supervision. FLUOROSCOPY TIME:  Zero minutes, 6 seconds (1 mGy) COMPLICATIONS: None PROCEDURE: The procedure, risks, benefits, and alternatives were explained to the patient. Questions regarding the procedure were encouraged and answered. The patient understands and consents to the procedure. Ultrasound survey was performed with images stored and sent to PACs. The right neck and chest was prepped with chlorhexidine, and draped in the usual sterile fashion using maximum barrier technique (cap and mask, sterile gown, sterile gloves, large sterile sheet, hand hygiene and cutaneous antiseptic). Antibiotic prophylaxis was provided with 2.0g Ancef administered IV one hour prior to skin incision. Local anesthesia was attained by infiltration with 1% lidocaine without epinephrine. Ultrasound demonstrated patency of the right internal jugular vein, and this was documented with an image. Under real-time ultrasound guidance, this vein was accessed with a 21 gauge micropuncture needle and image documentation was performed. A small dermatotomy was made at the access site with an 11 scalpel. A 0.018" wire was advanced into the SVC and used to estimate the length of the internal catheter. The access needle exchanged for a 31F micropuncture vascular sheath. The 0.018" wire was then removed and a 0.035" wire advanced into the IVC. An appropriate location for the subcutaneous reservoir was selected below the clavicle and an incision was made through the skin and underlying soft tissues. The subcutaneous tissues were then dissected using a combination of blunt and sharp surgical technique and a pocket was formed. A single lumen power injectable portacatheter was then tunneled through the subcutaneous tissues from the pocket to the dermatotomy and the port reservoir placed within the subcutaneous pocket. The venous access site was then serially dilated and a peel away vascular sheath placed over the wire. The  wire was removed and the port catheter advanced into position under fluoroscopic guidance. The catheter tip is positioned in the cavoatrial junction. This was documented with a spot image. The portacatheter was then tested and found to flush and aspirate well. The port was flushed with saline followed by 100 units/mL heparinized saline. The pocket was then closed in two layers using first subdermal inverted interrupted absorbable sutures followed by a running subcuticular suture. The epidermis was then sealed with Dermabond. The dermatotomy at the  venous access site was also seal with Dermabond. Patient tolerated the procedure well and remained hemodynamically stable throughout. No complications encountered and no significant blood loss encountered IMPRESSION: Status post right IJ port catheter placement. Catheter ready for use. Signed, Dulcy Fanny. Earleen Newport, DO [facility] Electronically Signed   By: Corrie Mckusick D.O.   On: 09/01/2016 14:03   Dg Chest Port 1 View  Result Date: 09/21/2016 CLINICAL DATA:  66 y/o  F; dyspnea. EXAM: PORTABLE CHEST 1 VIEW COMPARISON:  10/14/2016 chest radiograph FINDINGS: Massive left-sided diaphragmatic hernia. Endotracheal tube is 2.7 cm from carina. Enteric tube coils in the lower esophagus and tip is probably in the region of the proximal stomach. Stable cardiac silhouette given projection and technique. Increasing left-sided pleural effusion and atelectasis of left lung. No acute osseous abnormality is evident. IVC filter noted. Prominent bowel loops in upper abdomen partially visualize. IMPRESSION: 1. Endotracheal tube tip 2.7 cm from carina. 2. Enteric tube coronal zone lower esophagus with tip projecting over proximal stomach. Consider advancement. 3. Increasing left pleural effusion and left lung atelectasis. Electronically Signed   By: Kristine Garbe M.D.   On: 09/18/2016 17:40   Portable Chest 1  View  Result Date: 10/10/2016 CLINICAL DATA:  66 year old female with history of deep venous thrombosis, pulmonary thromboembolism, anemia and thrombocytopenia. IVC filter placement. EXAM: PORTABLE CHEST 1 VIEW COMPARISON:  Chest x-ray 09/25/2016. FINDINGS: Persistent elevation of the left hemidiaphragm. Opacities at the base of the left hemithorax may reflect underlying atelectasis and/or airspace consolidation, with superimposed small left pleural effusion. Right lung appears clear. Small right pleural effusion also suspected. No definite evidence of pulmonary edema. No pneumothorax. Heart size appears normal, although partially obscured. Upper mediastinal contours are distorted by patient positioning, but there does appear to be right paratracheal soft tissue fullness concerning for lymphadenopathy. Right internal jugular single-lumen power porta cath with tip terminating at the superior cavoatrial junction. IMPRESSION: 1. Trace right and small left pleural effusions. 2. Left-sided diaphragmatic hernia again noted with probable atelectasis in the left lung base. 3. Right paratracheal soft tissue fullness concerning for lymphadenopathy. Electronically Signed   By: Vinnie Langton M.D.   On: 10/11/2016 09:40   Dg Chest Port 1 View  Result Date: 09/18/2016 CLINICAL DATA:  Postoperative radiograph, for generalized chest pain, acute onset. Initial encounter. 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 The lungs are otherwise grossly clear. No pleural effusion or pneumothorax is seen. The cardiomediastinal silhouette is normal in size. A right-sided chest port is noted ending about the mid SVC. No acute osseous abnormalities are identified. IMPRESSION: Large left-sided diaphragmatic hernia again noted, with associated atelectasis. Lungs otherwise grossly clear. Electronically Signed    By: Garald Balding M.D.   On: 09/18/2016 21:41   Dg Chest Port 1 View  Result Date: 09/08/2016 CLINICAL DATA:  66 year old female with a history of leg swelling EXAM: PORTABLE CHEST 1 VIEW COMPARISON:  CT 08/28/2016, port catheter 09/01/2016 FINDINGS: Cardiomediastinal silhouette unchanged with left heart border partially obscured by overlying diaphragmatic hernia. 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 No pneumothorax. No large pleural effusion. No confluent airspace disease. Retrocardiac region not well evaluated. Asymmetric elevation of the left hemidiaphragm in this patient with known diaphragmatic hernia better demonstrated on prior CT. IMPRESSION: Re- demonstration of left-sided diaphragmatic hernia, with no definite evidence of  lobar pneumonia. 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 Electronically Signed   By: Corrie Mckusick D.O.   On: 09/08/2016 14:55   Ir Fluoro Guide Port Insertion Right  Result Date: 09/01/2016 INDICATION: 66 year old female with a history of gastric carcinoma EXAM: IMPLANTED PORT A CATH PLACEMENT WITH ULTRASOUND AND FLUOROSCOPIC GUIDANCE MEDICATIONS: 2.0 g Ancef; The antibiotic was administered within an appropriate time interval prior to skin puncture. ANESTHESIA/SEDATION: Moderate (conscious) sedation was employed during this procedure. A total of Versed 1.0 mg and Fentanyl 25 mcg was administered intravenously. Moderate Sedation Time: 15 minutes. The patient's level of consciousness and vital signs were monitored continuously by radiology nursing throughout the procedure under my direct supervision. FLUOROSCOPY TIME:  Zero minutes, 6 seconds (1 mGy) COMPLICATIONS: None PROCEDURE: The procedure, risks, benefits, and alternatives were explained to the patient. Questions regarding the procedure were encouraged and answered. The patient understands and consents to the procedure.  Ultrasound survey was performed with images stored and sent to PACs. The right neck and chest was prepped with chlorhexidine, and draped in the usual sterile fashion using maximum barrier technique (cap and mask, sterile gown, sterile gloves, large sterile sheet, hand hygiene and cutaneous antiseptic). Antibiotic prophylaxis was provided with 2.0g Ancef administered IV one hour prior to skin incision. Local anesthesia was attained by infiltration with 1% lidocaine without epinephrine. Ultrasound demonstrated patency of the right internal jugular vein, and this was documented with an image. Under real-time ultrasound guidance, this vein was accessed with a 21 gauge micropuncture needle and image documentation was performed. A small dermatotomy was made at the access site with an 11 scalpel. A 0.018" wire was advanced into the SVC and used to estimate the length of the internal catheter. The access needle exchanged for a 82F micropuncture vascular sheath. The 0.018" wire was then removed and a 0.035" wire advanced into the IVC. An appropriate location for the subcutaneous reservoir was selected below the clavicle and an incision was made through the skin and underlying soft tissues. The subcutaneous tissues were then dissected using a combination of blunt and sharp surgical technique and a pocket was formed. A single lumen power injectable portacatheter was then tunneled through the subcutaneous tissues from the pocket to the dermatotomy and the port reservoir placed within the subcutaneous pocket. The venous access site was then serially dilated and a peel away vascular sheath placed over the wire. The wire was removed and the port catheter advanced into position under fluoroscopic guidance. The catheter tip is positioned in the cavoatrial junction. This was documented with a spot image. The portacatheter was then tested and found to flush and aspirate well. The port was flushed with saline followed by 100 units/mL  heparinized saline. The pocket was then closed in two layers using first subdermal inverted interrupted absorbable sutures followed by a running subcuticular suture. The epidermis was then sealed with Dermabond. The dermatotomy at the venous access site was also seal with Dermabond. Patient tolerated the procedure well and remained hemodynamically stable throughout. No complications encountered and no significant blood loss encountered IMPRESSION: Status post right IJ port catheter placement. Catheter ready for use. Signed, Dulcy Fanny. Earleen Newport, DO [facility] Electronically Signed   By: Corrie Mckusick D.O.   On: 09/01/2016 14:03    ASSESSMENT & PLAN: 66 year old female  1. Gastric Cancer with peritoneal metastasis  2. Severe Anemia secondary to GI bleeding from tumor and malignancy  3.  Respiratory failure, required intubation 4.  History of DVT, off a/c due to GI bleeding, s/p IVC filter placement  5. AKI  6. PVD s/p left AKA  Recommendations: -not sure what trigger her respiratory failure, she has volume overload/anasarca on exam, new PE is also high on differential, giving her recent history of PE/DVT (although had IVC filter placed) and hypercoagulopathy from her underlying malignancy -Given the aggressive nature of metastatic gastric cancer, her overall prognosis is extremely poor, due to her multiple organ failure  -I think the chance of her recovery from this event is very low, I do not foresee she will recover well enough to be a candidate for chemotherapy -I would recommend comfort care and hospice. Her sister is coming down from [place], need to address this with her sister.  -continue supportive care including blood transfusion for now.  -I will be happy to participate the family meeting or talk to her sister.  -will follow up       Truitt Merle, MD 10/08/2016 10:34 PM

## 2016-09-28 NOTE — Consult Note (Signed)
Reason for Consult: Anemia and gastric cancer Referring Physician: [facility]  Jodi Collins HPI: This is a 66 year old female with a recent diagnosis of gastric cancer after she presented with severe anemia on Xarelto.  She was started on the medication for findings of a DVT and PE.  The EGD performed on 08/30/2016 was significant for a large mass with ulcerations.  Oncology evaluated the patient and further plans were to see her as an outpatient.  Unfortunately she suffered with loss of blood supply to her left lower extremity and she required an AKA on 09/18/2016.  She was in rehab, but her HGB dropped by two grams and GI was requested to provide further recommendations.  Past Medical History:  Diagnosis Date  . Anemia   . DVT (deep venous thrombosis) ([place]) 08/17/2016  . 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 GI bleed   . Hyperkalemia   . Hyponatremia   . PE (pulmonary thromboembolism) ([place]) 08/28/2016  . Stomach cancer ([place])    stage IV, dx 08/30/16  . Thrombocytopenia ([place])     Past Surgical History:  Procedure Laterality Date  . AMPUTATION Left 09/18/2016   Procedure: AMPUTATION ABOVE KNEE-LEFT;  Surgeon: Jodi Dutch, MD;  Location: [facility] OR;  Service: Vascular;  Laterality: Left;  . CERVICAL CONIZATION W/BX  2012   high grade squamous intraepithelial dysplasia.  Dr Jodi Collins.   . COLONOSCOPY W/ POLYPECTOMY  10/2006 and 10/2011   Dr Jodi Collins.  had adenomatous polyps.    . ESOPHAGOGASTRODUODENOSCOPY N/A 08/30/2016   Procedure: ESOPHAGOGASTRODUODENOSCOPY (EGD);  Surgeon: Jodi Ada, MD;  Location: [facility];  Service: Gastroenterology;  Laterality: N/A;  . IR FLUORO GUIDE PORT INSERTION RIGHT  09/01/2016  . IR IVC FILTER PLMT / S&I /IMG GUID/MOD SED  08/29/2016  . IR US GUIDE VASC ACCESS RIGHT  09/01/2016    Family History  Problem Relation Age of Onset  . Hypertension Mother   . Diabetes Mother     Social History:  reports that she has never  smoked. She has never used smokeless tobacco. She reports that she drinks alcohol. She reports that she does not use drugs.  Allergies:  Allergies  Allergen Reactions  . No Known Allergies     Medications:  Scheduled: . furosemide  40 mg Intravenous Once  . [START ON 09/29/2016] polyethylene glycol  17 g Oral Daily  . sodium chloride flush  3 mL Intravenous Q12H  . sodium polystyrene  30 g Oral Once   Continuous: . sodium chloride      Results for orders placed or performed during the hospital encounter of 09/29/2016 (from the past 24 hour(s))  Prepare RBC     Status: None   Collection Time: 09/27/2016 10:37 AM  Result Value Ref Range   Order Confirmation ORDER PROCESSED BY BLOOD BANK   MRSA PCR Screening     Status: None   Collection Time: 10/03/2016 12:35 PM  Result Value Ref Range   MRSA by PCR NEGATIVE NEGATIVE     Portable Chest 1 View  Result Date: 10/02/2016 CLINICAL DATA:  66 year old female with history of deep venous thrombosis, pulmonary thromboembolism, anemia and thrombocytopenia. IVC filter placement. EXAM: PORTABLE CHEST 1 VIEW COMPARISON:  Chest x-ray 09/25/2016. FINDINGS: Persistent elevation of the left hemidiaphragm. Opacities at the base of the left hemithorax may reflect underlying atelectasis and/or airspace consolidation, with superimposed small left pleural effusion. Right lung appears clear. Small right pleural effusion also  suspected. No definite evidence of pulmonary edema. No pneumothorax. Heart size appears normal, although partially obscured. Upper mediastinal contours are distorted by patient positioning, but there does appear to be right paratracheal soft tissue fullness concerning for lymphadenopathy. Right internal jugular single-lumen power porta cath with tip terminating at the superior cavoatrial junction. IMPRESSION: 1. Trace right and small left pleural effusions. 2. Left-sided diaphragmatic hernia again noted with probable atelectasis in the left lung  base. 3. Right paratracheal soft tissue fullness concerning for lymphadenopathy. Electronically Signed   By: Jodi Collins M.D.   On: 10/11/2016 09:40    ROS:  As stated above in the HPI otherwise negative.  Blood pressure 128/71, pulse (!) 104, temperature 97.9 F (36.6 C), temperature source Oral, resp. rate (!) 30, SpO2 99 %.    PE: Gen: Uncomfortable with having a bowel movement, Alert and Oriented  Assessment/Plan: 1) Gastric adenocarcinoma. 2) Left AKA. 3) Anemia.   There is very little that an be provided from a GI standpoint.  She has a large ulcerated mass in the proximal stomach, which is prone to bleed, even without anticoagulation.  The best way to address the bleeding is to treat the cancer, however, in her case bleeding will always be a potential issue.  She has Stage IV gastric cancer and it is not curable.  Currently she is receiving blood transfusions.  Plan: 1) Agree with the blood transfusions. 2) No GI interventions possible in her case. 3) Signing off.  Please call with any questions.  Jodi Collins D 10/02/2016, 3:31 PM

## 2016-09-28 NOTE — Patient Care Conference (Signed)
Inpatient RehabilitationTeam Conference and Plan of Care Update Date: 09/27/2016   Time: 2:10 PM    Patient Name: Jodi Collins      Medical Record Number: [id number]  Date of Birth: 06/10/1950 Sex: Female         Room/Bed: 4M02C/4M02C-01 Payor Info: Payor: Theme park manager / Plan: [place] / Product Type: *No Product type* /    Admitting Diagnosis: L AKA  Admit Date/Time:  09/21/2016  1:11 PM Admission Comments: No comment available   Primary Diagnosis:  S/P AKA (above knee amputation) unilateral, left (HCC) Principal Problem: S/P AKA (above knee amputation) unilateral, left [facility])  Patient Active Problem List   Diagnosis Date Noted  . Anasarca   . Malignant ascites   . AKI (acute kidney injury) ([place])   . Adjustment disorder with mixed anxiety and depressed mood   . Fluid collection (edema) in the arms, legs, hands and feet   . Hyperglycemia   . Urinary retention   . History of DVT (deep vein thrombosis)   . Post-operative pain   . Above knee amputation status, left ([place]) 09/21/2016  . Acute blood loss anemia   . Thrombocytopenia ([place])   . Stage 3 chronic kidney disease   . Acute kidney injury ([place]) 09/18/2016  . S/P AKA (above knee amputation) unilateral, left ([place]) 09/18/2016  . Leukocytosis 09/18/2016  . Hyponatremia   . GI bleed   . Post-op pain   . Sepsis ([place]) 09/08/2016  . Left leg DVT ([place]) 09/08/2016  . Hyperkalemia 09/08/2016  . Acute renal failure (ARF) ([place]) 09/08/2016  . Gastric cancer ([place]) 09/06/2016  . Pulmonary emboli ([place]) 08/28/2016  . Anemia 08/28/2016    Expected Discharge Date: Expected Discharge Date: 10/11/2016  Team Members Present: Physician leading conference: Dr. Delice Lesch Social Worker Present: Ovidio Kin, LCSW Nurse Present: Rozetta Nunnery, RN PT Present: Other (comment) Maia Breslow) OT Present: Clyda Greener, OT SLP Present: Windell Moulding, SLP PPS Coordinator present : Daiva Nakayama, RN, CRRN     Current  Status/Progress Goal Weekly Team Focus  Medical   Decreased functional mobility secondary to left AKA 09/18/2016   Improve mobility, safety, multiple medical issues  See above   Bowel/Bladder   Patient is continent of bowel, last BM 09/26/2016, patient has Foley catheter  To remove Foley catheter, to be continent of bowel and bladder with minimum assistance  Assess needs for bathroom to urinate and move bowel    Swallow/Nutrition/ Hydration             ADL's   Pt currently supervision for UB selfcare and total assist for LB selfcare sit to stand.  increased edema is noted in the LEs limiting flexibility.  Pt also with decreased overall endurance noted for all tasks, needing increased rest.  modified independent overall  selfcare retraining, endurance training, balance retraining, DME education, AE education, therapeutic exercise   Mobility   min A transfers, short distance gait with RW, supervision w/c  mod I w/c level  activity tolerance, strengthening, transfers, w/c   Communication             Safety/Cognition/ Behavioral Observations            Pain   patient is not complaining of pain tonight  To keep pain under control  assess for pain evry shift and prn   Skin   left stump with staples intact, no redness or signs of infection but drained lrge amount of serous clear drainage, no other skin  issues noted  To remain free of skin infection and breakdown  Assess skin and wound every shift and prn      *See Care Plan and progress notes for long and short-term goals.     Barriers to Discharge  Current Status/Progress Possible Resolutions Date Resolved   Physician              Therapies, GI/Heme/Onc consult, transfusion     09/20/2016   Nursing                    PT                      OT                    SLP                  SW                Discharge Planning/Teaching Needs:  Sister to come back to be there for a short time with pt for the transition home from  hospital      Team Discussion:  Medical issues-declining in therapies and her progress. GI and Oncology contacted to consult and come see pt. Hemoglobin dropping. Await input from consulting MD's.  Revisions to Treatment Plan:  Medical issues    Continued Need for Acute Rehabilitation Level of Care: The patient requires daily medical management by a physician with specialized training in physical medicine and rehabilitation for the following conditions: Daily direction of a multidisciplinary physical rehabilitation program to ensure safe treatment while eliciting the highest outcome that is of practical value to the patient.: Yes Daily medical management of patient stability for increased activity during participation in an intensive rehabilitation regime.: Yes Daily analysis of laboratory values and/or radiology reports with any subsequent need for medication adjustment of medical intervention for : Post surgical problems;Wound care problems;Other  Jodi Collins, Jodi Collins 10/07/2016, 8:45 AM

## 2016-09-28 NOTE — Progress Notes (Signed)
FMLA papers filled out for Jodi Collins.

## 2016-09-28 NOTE — Progress Notes (Signed)
[facility] PHYSICAL MEDICINE & REHABILITATION     PROGRESS NOTE  Subjective/Complaints:  Pt seen laying in bed this AM.  She states she slept well overnight and denies complaints.  However, she visibility looks more uncomfortable.  She was not seen by GI or Heme/Onc yesterday.  ROS: Denies CP, SOB, N/V/D.  Objective: Vital Signs: Blood pressure (!) 93/53, pulse 100, temperature 97.9 F (36.6 C), temperature source Oral, resp. rate 20, height 5\' 2"  (1.575 m), weight 70.3 kg (154 lb 15.7 oz), SpO2 95 %. US Renal  Result Date: 09/26/2016 CLINICAL DATA:  Urinary retention. EXAM: RENAL / URINARY TRACT ULTRASOUND COMPLETE COMPARISON:  CT scan dated 08/28/2016 FINDINGS: Right Kidney: Length: 8.3 cm. Echogenicity within normal limits. No mass or hydronephrosis visualized. Left Kidney: Length: 9.6 cm. Echogenicity within normal limits. No mass or hydronephrosis visualized. Bladder: The bladder is empty with a Foley catheter in place. Incidental note is made of ascites. Extensive sludge in the gallbladder. IMPRESSION: 1. Normal appearing kidneys. 2. The bladder is empty with a Foley catheter in place. 3. Ascites. 4. Sludge in the gallbladder. Electronically Signed   By: Lorriane Shire M.D.   On: 09/26/2016 11:06    Recent Labs  09/27/16 0415 09/27/2016 0426  WBC 12.2* 13.1*  HGB 6.4* 6.3*  HCT 20.0* 20.2*  PLT 120* PENDING    Recent Labs  09/27/16 0415 09/29/2016 0426  NA 127* 128*  K 5.2* 5.3*  CL 100* 100*  GLUCOSE 107* 97  BUN 56* 62*  CREATININE 1.45* 1.87*  CALCIUM 7.9* 7.8*   CBG (last 3)   Recent Labs  09/27/16 1701 09/27/16 2123 10/17/2016 0656  GLUCAP 110* 97 102*    Wt Readings from Last 3 Encounters:  09/26/16 70.3 kg (154 lb 15.7 oz)  09/18/16 63.5 kg (140 lb)  09/12/16 63.5 kg (140 lb)    Physical Exam:  BP (!) 93/53 (BP Location: Right Arm)   Pulse 100   Temp 97.9 F (36.6 C) (Oral)   Resp 20   Ht 5\' 2"  (1.575 m)   Wt 70.3 kg (154 lb 15.7 oz)   LMP  (LMP  Unknown)   SpO2 95%   BMI 28.35 kg/m  Constitutional: She appears well-developed. +Distressed. HENT: Normocephalic. Atraumatic Eyes: EOM are normal. No discharge.  Cardiovascular: Regular rate and rhythm. No JVD.  Respiratory: CTA Bilaterally. Increased WOB.   GI: Soft. Bowel sounds are normal.  Musc: Anasarca. No tenderness Neurological: She is alert and oriented.  Motor: B/L UE 4/5 proximal to distal RLE: 4-/5 proximal to distal LLE: 4-/5 HF Psychiatric: Pleasant and cooperative. Normal mood and behavior Skin. Amputation intact with staples c/d/i  Assessment/Plan: 1. 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 Physiatrist is providing close team supervision and 24 hour management of active medical problems listed below. Physiatrist and rehab team continue to assess barriers to discharge/monitor patient progress toward functional and medical goals.  Function:  Bathing Bathing position   Position: Sitting EOB  Bathing parts Body parts bathed by patient: Right arm, Left arm, Chest, Abdomen, Left upper leg, Right upper leg Body parts bathed by helper: Buttocks, Front perineal area, Right lower leg, Back  Bathing assist Assist Level:  (78%, min assist)      Upper Body Dressing/Undressing Upper body dressing   What is the patient wearing?: Pull over shirt/dress     Pull over shirt/dress - Perfomed by patient: Thread/unthread right  sleeve, Thread/unthread left sleeve, Put head through opening, Pull shirt over trunk          Upper body assist Assist Level: Supervision or verbal cues      Lower Body Dressing/Undressing Lower body dressing   What is the patient wearing?: Pants, Non-skid slipper socks     Pants- Performed by patient: Thread/unthread left pants leg Pants- Performed by helper: Thread/unthread right pants leg, Thread/unthread left pants leg, Pull pants  up/down Non-skid slipper socks- Performed by patient: Don/doff right sock Non-skid slipper socks- Performed by helper: Don/doff right sock                  Lower body assist Assist for lower body dressing: Touching or steadying assistance (Pt > 75%)      Toileting Toileting Toileting activity did not occur: Safety/medical concerns Toileting steps completed by patient: Adjust clothing prior to toileting Toileting steps completed by helper: Adjust clothing prior to toileting, Performs perineal hygiene, Adjust clothing after toileting Toileting Assistive Devices: Toilet aid  Toileting assist Assist level: Two helpers   Transfers Chair/bed transfer Chair/bed transfer activity did not occur: Safety/medical concerns Chair/bed transfer method: Stand pivot Chair/bed transfer assist level: Moderate assist (Pt 50 - 74%/lift or lower) Chair/bed transfer assistive device: Armrests, Medical sales representative     Max distance: 6' Assist level: Touching or steadying assistance (Pt > 75%)   Wheelchair   Type: Manual Max wheelchair distance: 125 ft Assist Level: Supervision or verbal cues  Cognition Comprehension Comprehension assist level: Follows complex conversation/direction with no assist  Expression Expression assist level: Expresses complex ideas: With extra time/assistive device  Social Interaction Social Interaction assist level: Interacts appropriately 90% of the time - Needs monitoring or encouragement for participation or interaction.  Problem Solving Problem solving assist level: Solves complex problems: Recognizes & self-corrects  Memory Memory assist level: Recognizes or recalls 90% of the time/requires cueing < 10% of the time    Medical Problem List and Plan:  1. Decreased functional mobility secondary to left AKA 09/18/2016   Cont CIR, pt with poor activity tolerance now, may need to be transferred, will await Heme/Onc 2. DVT Prophylaxis/Anticoagulation: SCD  RLE. 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 Patient does have an IVC filter.  3. Pain Management: Hydrocodone as needed  4. Mood: Provide emotional support  5. Neuropsych: This patient is capable of making decisions on her own behalf.  6. Skin/Wound Care: Routine skin checks  7. Fluids/Electrolytes/Nutrition: Routine I&Os 8. Acute blood loss anemia.   hgb 6.3 on 7/12  Cont iron supplementation   Awaiting consult Heme/Onc, will transfuse  May need GI consult as well 9. Hyperkalemia. Resolved.  10. History of gastric/stomach cancer. Follow-up outpatient Dr. Mila Merry  11. Leukocytosis  WBCs 13.1 on 7/12  Cont to monitor 12. Hyperglycemia  Likely stress induced  Overall controlled 7/12  Cont to monitor 13. Hyponatremia  Na 128 on 7/12  Cont to monitor 14. Thrombocytopenia  Plts pending on 7/12  Cont to monitor 15. Urinary retention   Unable to remove fluid with I/O, foley ordered, d/ced 7/11  CXR reviewed, unremarkable for fluid  Cont Lasix, decreased 7/11  BNP WNL  Renal U/S showing ascities, gall bladder sludge 16. AKI - severe  Cr. 1.87 on 7/12  Likely secondary to diuretic, decreased 7/11 + ?organ failure  IVF started, increased  Encourage fluids 17. Anasarca  Likely related to #10  Await consult Heme/Onc 18. Hyperkalemia  K+ 5.3 on 7/12  Cont to monitor   LOS (Days) 7 A FACE TO FACE EVALUATION WAS PERFORMED  Lashauna Arpin Lorie Phenix 09/29/2016 7:34 AM

## 2016-09-28 NOTE — H&P (Signed)
History and Physical    Jodi Collins KZL:[id number] DOB: [birth year] DOA: 09/21/2016  PCP: Glendale Chard, MD Patient coming from: Rehab unit [phone]  Chief Complaint: Acute renal failure/acute blood loss anemia/shortness of breath  HPI: Jodi Collins is a very pleasant 66 y.o. female with medical history significant for DVT/pulmonary emboli May 2018 placed on Xarelto which was discontinued when she developed GI bleed and status post IVC filter, gastric/stomach cancer confirmed by EGD June 2018. She was seen by oncology June 2018 and plans for chemotherapy were made. 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 He had a hemoglobin of 6.6 postoperatively and transfuse 2 units. She had hyperkalemia and was given Kayexalate. She was transferred/admitted to inpatient rehabilitation January 5. Today [facility] was called for transfer to medicine service as patient has developed acute renal failure, hyponatremia hyperkalemia, anasarca and increased work of breathing likely related to acute blood loss anemia. Patient denies any pain headache dizziness syncope or near-syncope. She does report a feeling of "tightness" in her leg. She denies abdominal pain nausea vomiting diarrhea constipation melena bright red blood per rectum. She does report some increased work of breathing but denies any blood tinged sputum.     Review of Systems: As per HPI otherwise all other systems reviewed and are negative.   Ambulatory Status: Early works with physical therapy daily on the inpatient rehabilitation status post above-the-knee amputation on the left therefore wheelchair/bed bound  Past Medical History:  Diagnosis Date  . Anemia   . DVT (deep venous thrombosis) ([place]) 08/17/2016  . 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 GI bleed   . Hyperkalemia   . Hyponatremia   . PE (pulmonary thromboembolism) ([place])  08/28/2016  . 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 Thrombocytopenia ([place])     Past Surgical History:  Procedure Laterality Date  . AMPUTATION Left 09/18/2016   Procedure: AMPUTATION ABOVE KNEE-LEFT;  Surgeon: Elam Dutch, MD;  Location: [facility] OR;  Service: Vascular;  Laterality: Left;  . CERVICAL CONIZATION W/BX  2012   high grade squamous intraepithelial dysplasia.  Dr Garwin Brothers.   . COLONOSCOPY W/ POLYPECTOMY  10/2006 and 10/2011   Dr Collene Mares.  had adenomatous polyps.    . ESOPHAGOGASTRODUODENOSCOPY N/A 08/30/2016   Procedure: ESOPHAGOGASTRODUODENOSCOPY (EGD);  Surgeon: Carol Ada, MD;  Location: [place];  Service: Gastroenterology;  Laterality: N/A;  . IR FLUORO GUIDE PORT INSERTION RIGHT  09/01/2016  . IR IVC FILTER PLMT / S&I /IMG GUID/MOD SED  08/29/2016  . IR US GUIDE VASC ACCESS RIGHT  09/01/2016    Social History   Social History  . Marital status: Legally Separated    Spouse name: N/A  . Number of children: N/A  . Years of education: N/A   Occupational History  . legal assistant    Social History Main Topics  . Smoking status: Never Smoker  . Smokeless tobacco: Never Used  . Alcohol use Yes     Comment: rarely  . Drug use: No  . Sexual activity: Not Currently   Other Topics Concern  . Not on file   Social History Narrative  . No narrative on file    Allergies  Allergen Reactions  . No Known Allergies     Family History  Problem Relation Age of Onset  . Hypertension Mother   . Diabetes Mother     Prior to  Admission medications   Medication Sig Start Date End Date Taking? Authorizing Provider  dexamethasone (DECADRON) 4 MG tablet Take 2 tablets (8 mg total) by mouth daily. Start the day after chemotherapy for 2 days. Take with food. 09/11/16   Twana First, MD  feeding supplement, ENSURE ENLIVE, (ENSURE ENLIVE) LIQD Take 237 mLs by mouth 2 (two) times daily between meals. 09/21/16   Aline August, MD  fluorouracil CALGB 58099 in sodium  chloride 0.9 % 150 mL Inject into the vein over 96 hr. Every 2 weeks, wears pump for 46 hours    [provider]  leucovorin in dextrose 5 % 250 mL Inject into the vein once. Every 2 weeks    [provider]  lidocaine-prilocaine (EMLA) cream Apply to affected area once 09/07/16   Twana First, MD  Misc. Devices [place] Wheelchair 09/12/16   Twana First, MD  ondansetron (ZOFRAN) 8 MG tablet Take 1 tablet (8 mg total) by mouth 2 (two) times daily as needed for refractory nausea / vomiting. Start on day 3 after chemotherapy. 09/07/16   Twana First, MD  OXALIPLATIN IV Inject into the vein. Every 2 weeks    [provider]  oxyCODONE-acetaminophen (PERCOCET/ROXICET) 5-325 MG tablet Take 1-2 tablets by mouth every 4 (four) hours as needed for severe pain. 09/07/16   Twana First, MD  polyethylene glycol powder [facility]) powder 1 capful daily as needed Patient taking differently: Take 17 g by mouth daily.  09/11/16   Twana First, MD  prochlorperazine (COMPAZINE) 10 MG tablet Take 1 tablet (10 mg total) by mouth every 6 (six) hours as needed (Nausea or vomiting). 09/07/16   Twana First, MD    Physical Exam: Vitals:   09/26/16 1416 09/27/16 0500 09/27/16 1500 10/08/2016 0539  BP: 93/64 (!) 96/56 (!) 93/58 (!) 93/53  Pulse: 100 (!) 102 100 100  Resp: 18 18 18 20   Temp: 98.1 F (36.7 C) (!) 97.5 F (36.4 C) 97.6 F (36.4 C) 97.9 F (36.6 C)  TempSrc: Axillary Oral Oral Oral  SpO2: 98% 100% 97% 95%  Weight:      Height:         General:  Appears calm and comfortable Mild increased work of breathing and not in distress Eyes:  PERRL, EOMI, normal lids, iris ENT:  grossly normal hearing, lips & tongue, because membranes of her mouth are pink but dry Neck:  no LAD, masses or thyromegaly Cardiovascular:  Tachycardic, no m/r/g. 2+ pitting edema up to lumbar spine  Respiratory:  Mild increased work of breathing. Fine crackles auscultated bilateral bases. I hear no wheeze  or rhonchi Abdomen:  soft, ntnd, positive but sluggish bowel sounds. No guarding or rebounding Skin:  Status post left above-the-knee amputation incision line clean and dry moderate clear drainage no erythema, central line site without erythema swelling. Otherwise skin without rash or lesions Musculoskeletal:  Left above-the-knee amputation otherwise joints without swelling/erythema Psychiatric:  grossly normal mood and affect, speech fluent and appropriate, AOx3 Neurologic:  CN 2-12 grossly intact, moves all extremities in coordinated fashion, sensation intact speech clear facial symmetry  Labs on Admission: I have personally reviewed following labs and imaging studies  CBC:  Recent Labs Lab 09/22/16 0646 09/25/16 0411 09/26/16 0442 09/27/16 0415 10/10/2016 0426  WBC 15.3* 12.3* 12.8* 12.2* 13.1*  NEUTROABS 13.8*  --  11.0* 10.2* 11.0*  HGB 8.8* 8.0* 7.2* 6.4* 6.3*  HCT 27.5* 25.0* 23.1* 20.0* 20.2*  MCV 86.8 87.4 86.2 85.5 84.5  PLT 78* 106*  113* 120* 782*   Basic Metabolic Panel:  Recent Labs Lab 09/22/16 0646 09/25/16 0411 09/26/16 0442 09/27/16 0415 10/02/2016 0426  NA 133* 131* 129* 127* 128*  K 3.8 4.7 5.1 5.2* 5.3*  CL 106 103 102 100* 100*  CO2 21* 19* 17* 17* 17*  GLUCOSE 91 113* 102* 107* 97  BUN 22* 36* 47* 56* 62*  CREATININE 0.65 0.86 1.15* 1.45* 1.87*  CALCIUM 8.0* 8.3* 8.2* 7.9* 7.8*   GFR: Estimated Creatinine Clearance: 27.9 mL/min (A) (by C-G formula based on SCr of 1.87 mg/dL (H)). Liver Function Tests:  Recent Labs Lab 09/22/16 0646  AST 44*  ALT 29  ALKPHOS 233*  BILITOT 0.9  PROT 5.6*  ALBUMIN 1.7*   No results for input(s): LIPASE, AMYLASE in the last 168 hours. No results for input(s): AMMONIA in the last 168 hours. Coagulation Profile: No results for input(s): INR, PROTIME in the last 168 hours. Cardiac Enzymes: No results for input(s): CKTOTAL, CKMB, CKMBINDEX, TROPONINI in the last 168 hours. BNP (last 3 results) No results for  input(s): PROBNP in the last 8760 hours. HbA1C: No results for input(s): HGBA1C in the last 72 hours. CBG:  Recent Labs Lab 09/27/16 0637 09/27/16 1135 09/27/16 1701 09/27/16 2123 09/24/2016 0656  GLUCAP 101* 104* 110* 97 102*   Lipid Profile: No results for input(s): CHOL, HDL, LDLCALC, TRIG, CHOLHDL, LDLDIRECT in the last 72 hours. Thyroid Function Tests: No results for input(s): TSH, T4TOTAL, FREET4, T3FREE, THYROIDAB in the last 72 hours. Anemia Panel: No results for input(s): VITAMINB12, FOLATE, FERRITIN, TIBC, IRON, RETICCTPCT in the last 72 hours. Urine analysis:    Component Value Date/Time   COLORURINE YELLOW 09/19/2016 1045   APPEARANCEUR CLEAR 09/19/2016 1045   LABSPEC 1.025 09/19/2016 1045   PHURINE 5.0 09/19/2016 1045   GLUCOSEU NEGATIVE 09/19/2016 1045   HGBUR NEGATIVE 09/19/2016 [place] 09/19/2016 [place] 09/19/2016 1045   PROTEINUR NEGATIVE 09/19/2016 1045   NITRITE NEGATIVE 09/19/2016 1045   LEUKOCYTESUR NEGATIVE 09/19/2016 1045    Creatinine Clearance: Estimated Creatinine Clearance: 27.9 mL/min (A) (by C-G formula based on SCr of 1.87 mg/dL (H)).  Sepsis Labs: @LABRCNTIP (procalcitonin:4,lacticidven:4) ) Recent Results (from the past 240 hour(s))  Culture, blood (Routine X 2) w Reflex to ID Panel     Status: None   Collection Time: 09/19/16  9:37 AM  Result Value Ref Range Status   Specimen Description BLOOD LEFT ARM  Final   Special Requests IN PEDIATRIC BOTTLE Blood Culture adequate volume  Final   Culture NO GROWTH 5 DAYS  Final   Report Status 09/24/2016 FINAL  Final  Culture, blood (Routine X 2) w Reflex to ID Panel     Status: None   Collection Time: 09/19/16  9:37 AM  Result Value Ref Range Status   Specimen Description BLOOD RIGHT HAND  Final   Special Requests IN PEDIATRIC BOTTLE Blood Culture adequate volume  Final   Culture NO GROWTH 5 DAYS  Final   Report Status 09/24/2016 FINAL  Final  Urine  Culture     Status: Abnormal   Collection Time: 09/19/16 10:45 AM  Result Value Ref Range Status   Specimen Description URINE, CLEAN CATCH  Final   Special Requests NONE  Final   Culture MULTIPLE SPECIES PRESENT, SUGGEST RECOLLECTION (A)  Final   Report Status 09/20/2016 FINAL  Final     Radiological Exams on Admission: US Renal  Result Date: 09/26/2016 CLINICAL DATA:  Urinary retention.  EXAM: RENAL / URINARY TRACT ULTRASOUND COMPLETE COMPARISON:  CT scan dated 08/28/2016 FINDINGS: Right Kidney: Length: 8.3 cm. Echogenicity within normal limits. No mass or hydronephrosis visualized. Left Kidney: Length: 9.6 cm. Echogenicity within normal limits. No mass or hydronephrosis visualized. Bladder: The bladder is empty with a Foley catheter in place. Incidental note is made of ascites. Extensive sludge in the gallbladder. IMPRESSION: 1. Normal appearing kidneys. 2. The bladder is empty with a Foley catheter in place. 3. Ascites. 4. Sludge in the gallbladder. Electronically Signed   By: Lorriane Shire M.D.   On: 09/26/2016 11:06   Portable Chest 1 View  Result Date: 10/07/2016 CLINICAL DATA:  66 year old female with history of deep venous thrombosis, pulmonary thromboembolism, anemia and thrombocytopenia. IVC filter placement. EXAM: PORTABLE CHEST 1 VIEW COMPARISON:  Chest x-ray 09/25/2016. FINDINGS: Persistent elevation of the left hemidiaphragm. Opacities at the base of the left hemithorax may reflect underlying atelectasis and/or airspace consolidation, with superimposed small left pleural effusion. Right lung appears clear. Small right pleural effusion also suspected. No definite evidence of pulmonary edema. No pneumothorax. Heart size appears normal, although partially obscured. Upper mediastinal contours are distorted by patient positioning, but there does appear to be right paratracheal soft tissue fullness concerning for lymphadenopathy. Right internal jugular single-lumen power porta cath with tip  terminating at the superior cavoatrial junction. IMPRESSION: 1. Trace right and small left pleural effusions. 2. Left-sided diaphragmatic hernia again noted with probable atelectasis in the left lung base. 3. Right paratracheal soft tissue fullness concerning for lymphadenopathy. Electronically Signed   By: Vinnie Langton M.D.   On: 09/29/2016 09:40    EKG: Independently reviewed pending.  Assessment/Plan Principal Problem:   Acute renal failure (ARF) (HCC) Active Problems:   Gastric cancer (HCC)   Hyperkalemia   Hyponatremia   S/P AKA (above knee amputation) unilateral, left (HCC)   Leukocytosis   Acute blood loss anemia   Above knee amputation status, left (HCC)   History of DVT (deep vein thrombosis)   Urinary retention   Anasarca   Dyspnea   #1. Acute renal failure. Likely related to ATN status post above-the-knee amputation in the setting of GI bleed and hypotension and acute blood loss anemia. Creatinine 0.65 6 days ago Creatinine this morning 1.87. Hemoglobin 6.3 this morning down from 8.8 6 days ago. chart review indicates systolic blood pressure dropped to 88 -92 range over the last 24 hours.  -Admit to step down -Transfuse 2 units packed red blood cells -Hold nephrotoxins -Obtain a 2-D echo tomorrow -Monitor urine output -Lasix between units  -Recheck this afternoon  #2. Acute blood loss anemia in the setting of recent above-the-knee amputation and recent diagnosis of GI bleed related to stomach/gastric cancer in the setting of chronic iron deficiency anemia.. Chemotherapy is planned once she has recovered from surgery. Hemoglobin 6.7 postoperatively and she received 2 units of packed red blood cells. She was given additional unit PRBC's 2 days later for hg 6.6. She was given 1 dose of IV feraheme. Chart review indicates her hemoglobin increased to 8.8 but has been drifting downward for the last 6 days. -Transfuse 2 units of packed red blood cells -Incidental GI consult  and/or hematoma on consult  #3. Increased work of breathing/dyspnea. Likely related to volume overload in the setting of acute blood loss anemia. Fine crackles noted bilateral bases. Patient developed anasarca oxygen saturation level greater than 90% on room air. -Obtain chest x-ray -Obtain a 2-D echo tomorrow -IV Lasix as noted above -  Monitor oxygen saturation level -Oxygen supplementation as indicated  #4. Hyperkalemia/hyponatremia. Potassium level 5.3 and sodium level 128. Related to dehydration in setting of  #1 and #2 and anasarca. Sodium level within the limits of normal 6 days ago. Chart review indicates potassium level baseline high end of normal. She received 2 doses of Kayexalate and potassium remained slightly elevated. -kayexelate -urine creatinine -urine sodium and -Transfusion as noted above -recheck this afternoon  5. Leukocytosis. WBC 13.1. Down from 15.3 6 days ago. Etiology uncertain. She did have gangrenous foot pre-opertively Suture line clean without drainage or erythema. Central line site unremarkable. She is nontoxic appearing. -Obtain chest x-ray -obtain rectal temp -Obtain a urinalysis -Monitor  6. History of GI bleed/gastric cancer. Recent diagnosis of DVT placed on Xarelto which was stopped after several days due to melena. She had an upper endoscopy June 13 revealing 4 cm hiatal hernia large fungating mass. Biopsies were taken. Evaluated by oncology chemotherapy was scheduled to start last week however this was put on hold until she is recovered from. -Serial CBCs -Consider GI/heme oncology consult -consider palliative care consult  7. Status post left above-the-knee amputation secondary to left foot ischemia -She underwent left AKA 09/18/2016 -Per vascular surgery He was on anticoagulation as noted above which was discontinued secondary GI bleed in the setting of gastric cancer #8. History of left lower extremity DVT. Status post IVC filter.  #8.  Hypotension. Chart review indicates patient's blood pressure baseline low end of normal. -monitor.  -transfuse as noted above   DVT prophylaxis: scd Code Status: full  Family Communication: communication  Disposition Plan: snf  Consults called: none  Admission status: inpatient    Radene Gunning MD [facility]  If 7PM-7AM, please contact night-coverage [email or web address] Password [facility]  10/15/2016, 9:44 AM

## 2016-09-28 NOTE — H&P (Signed)
History and Physical    Jodi Collins YBO:[id number] DOB: 1950-10-14 DOA: 09/29/2016  PCP: Glendale Chard, MD Patient coming from: Rehab unit [id number]  Chief Complaint: Acute renal failure/acute blood loss anemia/shortness of breath  HPI: Jodi Collins is a very pleasant 66 y.o. female with medical history significant for DVT/pulmonary emboli May 2018 placed on Xarelto which was discontinued when she developed GI bleed and status post IVC filter, gastric/stomach cancer confirmed by EGD June 2018. She was seen by oncology June 2018 and plans for chemotherapy were made. 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 He had a hemoglobin of 6.6 postoperatively and transfuse 2 units. She had hyperkalemia and was given Kayexalate. She was transferred/admitted to inpatient rehabilitation July 5. Today [facility] was called for transfer to medicine service as patient has developed acute renal failure, hyponatremia hyperkalemia, anasarca and increased work of breathing likely related to acute blood loss anemia. Patient denies any pain headache dizziness syncope or near-syncope. She does report a feeling of "tightness" in her leg. She denies abdominal pain nausea vomiting diarrhea constipation melena bright red blood per rectum. She does report some increased work of breathing but denies any blood tinged sputum.     Review of Systems: As per HPI otherwise all other systems reviewed and are negative.   Ambulatory Status: Early works with physical therapy daily on the inpatient rehabilitation status post above-the-knee amputation on the left therefore wheelchair/bed bound  Past Medical History:  Diagnosis Date  . Anemia   . DVT (deep venous thrombosis) ([place]) 08/17/2016  . 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 GI bleed   . Hyperkalemia   . Hyponatremia   . PE (pulmonary thromboembolism) ([place])  08/28/2016  . 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 Thrombocytopenia ([place])     Past Surgical History:  Procedure Laterality Date  . AMPUTATION Left 09/18/2016   Procedure: AMPUTATION ABOVE KNEE-LEFT;  Surgeon: Elam Dutch, MD;  Location: [facility] OR;  Service: Vascular;  Laterality: Left;  . CERVICAL CONIZATION W/BX  2012   high grade squamous intraepithelial dysplasia.  Dr Garwin Brothers.   . COLONOSCOPY W/ POLYPECTOMY  10/2006 and 10/2011   Dr Collene Mares.  had adenomatous polyps.    . ESOPHAGOGASTRODUODENOSCOPY N/A 08/30/2016   Procedure: ESOPHAGOGASTRODUODENOSCOPY (EGD);  Surgeon: Carol Ada, MD;  Location: [facility];  Service: Gastroenterology;  Laterality: N/A;  . IR FLUORO GUIDE PORT INSERTION RIGHT  09/01/2016  . IR IVC FILTER PLMT / S&I /IMG GUID/MOD SED  08/29/2016  . IR US GUIDE VASC ACCESS RIGHT  09/01/2016    Social History   Social History  . Marital status: Legally Separated    Spouse name: N/A  . Number of children: N/A  . Years of education: N/A   Occupational History  . legal assistant    Social History Main Topics  . Smoking status: Never Smoker  . Smokeless tobacco: Never Used  . Alcohol use Yes     Comment: rarely  . Drug use: No  . Sexual activity: Not Currently   Other Topics Concern  . Not on file   Social History Narrative  . No narrative on file    Allergies  Allergen Reactions  . No Known Allergies     Family History  Problem Relation Age of Onset  . Hypertension Mother   . Diabetes Mother     Prior to  Admission medications   Medication Sig Start Date End Date Taking? Authorizing Provider  dexamethasone (DECADRON) 4 MG tablet Take 2 tablets (8 mg total) by mouth daily. Start the day after chemotherapy for 2 days. Take with food. 09/11/16   Twana First, MD  feeding supplement, ENSURE ENLIVE, (ENSURE ENLIVE) LIQD Take 237 mLs by mouth 2 (two) times daily between meals. 09/21/16   Aline August, MD  fluorouracil CALGB 68127 in sodium  chloride 0.9 % 150 mL Inject into the vein over 96 hr. Every 2 weeks, wears pump for 46 hours    [provider]  leucovorin in dextrose 5 % 250 mL Inject into the vein once. Every 2 weeks    [provider]  lidocaine-prilocaine (EMLA) cream Apply to affected area once 09/07/16   Twana First, MD  Misc. Devices [place] Wheelchair 09/12/16   Twana First, MD  ondansetron (ZOFRAN) 8 MG tablet Take 1 tablet (8 mg total) by mouth 2 (two) times daily as needed for refractory nausea / vomiting. Start on day 3 after chemotherapy. 09/07/16   Twana First, MD  OXALIPLATIN IV Inject into the vein. Every 2 weeks    [provider]  oxyCODONE-acetaminophen (PERCOCET/ROXICET) 5-325 MG tablet Take 1-2 tablets by mouth every 4 (four) hours as needed for severe pain. 09/07/16   Twana First, MD  polyethylene glycol powder [facility]) powder 1 capful daily as needed Patient taking differently: Take 17 g by mouth daily.  09/11/16   Twana First, MD  prochlorperazine (COMPAZINE) 10 MG tablet Take 1 tablet (10 mg total) by mouth every 6 (six) hours as needed (Nausea or vomiting). 09/07/16   Twana First, MD    Physical Exam: Vitals:   09/27/2016 1211  BP: 100/68  Temp: 98.4 F (36.9 C)  TempSrc: Oral     General:  Appears calm and comfortable Mild increased work of breathing and not in distress Eyes:  PERRL, EOMI, normal lids, iris ENT:  grossly normal hearing, lips & tongue, because membranes of her mouth are pink but dry Neck:  no LAD, masses or thyromegaly Cardiovascular:  Tachycardic, no m/r/g. 2+ pitting edema up to lumbar spine  Respiratory:  Mild increased work of breathing. Fine crackles auscultated bilateral bases. I hear no wheeze or rhonchi Abdomen:  soft, ntnd, positive but sluggish bowel sounds. No guarding or rebounding Skin:  Status post left above-the-knee amputation incision line clean and dry moderate clear drainage no erythema, central line site without erythema  swelling. Otherwise skin without rash or lesions Musculoskeletal:  Left above-the-knee amputation otherwise joints without swelling/erythema Psychiatric:  grossly normal mood and affect, speech fluent and appropriate, AOx3 Neurologic:  CN 2-12 grossly intact, moves all extremities in coordinated fashion, sensation intact speech clear facial symmetry  Labs on Admission: I have personally reviewed following labs and imaging studies  CBC:  Recent Labs Lab 09/22/16 0646 09/25/16 0411 09/26/16 0442 09/27/16 0415 09/20/2016 0426  WBC 15.3* 12.3* 12.8* 12.2* 13.1*  NEUTROABS 13.8*  --  11.0* 10.2* 11.0*  HGB 8.8* 8.0* 7.2* 6.4* 6.3*  HCT 27.5* 25.0* 23.1* 20.0* 20.2*  MCV 86.8 87.4 86.2 85.5 84.5  PLT 78* 106* 113* 120* 517*   Basic Metabolic Panel:  Recent Labs Lab 09/22/16 0646 09/25/16 0411 09/26/16 0442 09/27/16 0415 09/19/2016 0426  NA 133* 131* 129* 127* 128*  K 3.8 4.7 5.1 5.2* 5.3*  CL 106 103 102 100* 100*  CO2 21* 19* 17* 17* 17*  GLUCOSE 91 113* 102* 107* 97  BUN  22* 36* 47* 56* 62*  CREATININE 0.65 0.86 1.15* 1.45* 1.87*  CALCIUM 8.0* 8.3* 8.2* 7.9* 7.8*   GFR: Estimated Creatinine Clearance: 27.9 mL/min (A) (by C-G formula based on SCr of 1.87 mg/dL (H)). Liver Function Tests:  Recent Labs Lab 09/22/16 0646  AST 44*  ALT 29  ALKPHOS 233*  BILITOT 0.9  PROT 5.6*  ALBUMIN 1.7*   No results for input(s): LIPASE, AMYLASE in the last 168 hours. No results for input(s): AMMONIA in the last 168 hours. Coagulation Profile: No results for input(s): INR, PROTIME in the last 168 hours. Cardiac Enzymes: No results for input(s): CKTOTAL, CKMB, CKMBINDEX, TROPONINI in the last 168 hours. BNP (last 3 results) No results for input(s): PROBNP in the last 8760 hours. HbA1C: No results for input(s): HGBA1C in the last 72 hours. CBG:  Recent Labs Lab 09/27/16 0637 09/27/16 1135 09/27/16 1701 09/27/16 2123 09/27/2016 0656  GLUCAP 101* 104* 110* 97 102*   Lipid  Profile: No results for input(s): CHOL, HDL, LDLCALC, TRIG, CHOLHDL, LDLDIRECT in the last 72 hours. Thyroid Function Tests: No results for input(s): TSH, T4TOTAL, FREET4, T3FREE, THYROIDAB in the last 72 hours. Anemia Panel: No results for input(s): VITAMINB12, FOLATE, FERRITIN, TIBC, IRON, RETICCTPCT in the last 72 hours. Urine analysis:    Component Value Date/Time   COLORURINE YELLOW 09/19/2016 1045   APPEARANCEUR CLEAR 09/19/2016 1045   LABSPEC 1.025 09/19/2016 1045   PHURINE 5.0 09/19/2016 1045   GLUCOSEU NEGATIVE 09/19/2016 1045   HGBUR NEGATIVE 09/19/2016 [place] 09/19/2016 [place] 09/19/2016 1045   PROTEINUR NEGATIVE 09/19/2016 1045   NITRITE NEGATIVE 09/19/2016 1045   LEUKOCYTESUR NEGATIVE 09/19/2016 1045    Creatinine Clearance: Estimated Creatinine Clearance: 27.9 mL/min (A) (by C-G formula based on SCr of 1.87 mg/dL (H)).  Sepsis Labs: @LABRCNTIP (procalcitonin:4,lacticidven:4) ) Recent Results (from the past 240 hour(s))  Culture, blood (Routine X 2) w Reflex to ID Panel     Status: None   Collection Time: 09/19/16  9:37 AM  Result Value Ref Range Status   Specimen Description BLOOD LEFT ARM  Final   Special Requests IN PEDIATRIC BOTTLE Blood Culture adequate volume  Final   Culture NO GROWTH 5 DAYS  Final   Report Status 09/24/2016 FINAL  Final  Culture, blood (Routine X 2) w Reflex to ID Panel     Status: None   Collection Time: 09/19/16  9:37 AM  Result Value Ref Range Status   Specimen Description BLOOD RIGHT HAND  Final   Special Requests IN PEDIATRIC BOTTLE Blood Culture adequate volume  Final   Culture NO GROWTH 5 DAYS  Final   Report Status 09/24/2016 FINAL  Final  Urine Culture     Status: Abnormal   Collection Time: 09/19/16 10:45 AM  Result Value Ref Range Status   Specimen Description URINE, CLEAN CATCH  Final   Special Requests NONE  Final   Culture MULTIPLE SPECIES PRESENT, SUGGEST RECOLLECTION (A)  Final    Report Status 09/20/2016 FINAL  Final     Radiological Exams on Admission: Portable Chest 1 View  Result Date: 10/08/2016 CLINICAL DATA:  66 year old female with history of deep venous thrombosis, pulmonary thromboembolism, anemia and thrombocytopenia. IVC filter placement. EXAM: PORTABLE CHEST 1 VIEW COMPARISON:  Chest x-ray 09/25/2016. FINDINGS: Persistent elevation of the left hemidiaphragm. Opacities at the base of the left hemithorax may reflect underlying atelectasis and/or airspace consolidation, with superimposed small left pleural effusion. Right lung appears clear. Small  right pleural effusion also suspected. No definite evidence of pulmonary edema. No pneumothorax. Heart size appears normal, although partially obscured. Upper mediastinal contours are distorted by patient positioning, but there does appear to be right paratracheal soft tissue fullness concerning for lymphadenopathy. Right internal jugular single-lumen power porta cath with tip terminating at the superior cavoatrial junction. IMPRESSION: 1. Trace right and small left pleural effusions. 2. Left-sided diaphragmatic hernia again noted with probable atelectasis in the left lung base. 3. Right paratracheal soft tissue fullness concerning for lymphadenopathy. Electronically Signed   By: Vinnie Langton M.D.   On: 10/09/2016 09:40    EKG: Independently reviewed pending.  Assessment/Plan Active Problems:   Acute renal failure ([place])   #1. Acute renal failure. Likely related to ATN status post above-the-knee amputation in the setting of GI bleed and hypotension and acute blood loss anemia. Creatinine 0.65 6 days ago Creatinine this morning 1.87. Hemoglobin 6.3 this morning down from 8.8 6 days ago. chart review indicates systolic blood pressure dropped to 88 -92 range over the last 24 hours.  -Admit to step down -Transfuse 2 units packed red blood cells -Hold nephrotoxins -Obtain a 2-D echo tomorrow -Monitor urine  output -Lasix after 2 units  -Recheck this afternoon  #2. Acute blood loss anemia in the setting of recent above-the-knee amputation and recent diagnosis of GI bleed related to stomach/gastric cancer in the setting of chronic iron deficiency anemia.. Chemotherapy is planned once she has recovered from surgery. Hemoglobin 6.7 postoperatively and she received 2 units of packed red blood cells. She was given additional unit PRBC's 2 days later for hg 6.6. She was given 1 dose of IV feraheme. Chart review indicates her hemoglobin increased to 8.8 but has been drifting downward for the last 6 days. -Transfuse 2 units of packed red blood cells -Consider GI consult and/or hem/onc consult  #3. Increased work of breathing/dyspnea. Likely related to volume overload in the setting of acute blood loss anemia. Fine crackles noted bilateral bases. Patient developed anasarca oxygen saturation level greater than 90% on room air. -Obtain chest x-ray -Obtain a 2-D echo tomorrow -IV Lasix as noted above -Monitor oxygen saturation level -Oxygen supplementation as indicated  #4. Hyperkalemia/hyponatremia. Potassium level 5.3 and sodium level 128. Related to dehydration in setting of  #1 and #2 and anasarca. Sodium level within the limits of normal 6 days ago. Chart review indicates potassium level baseline high end of normal. She received 2 doses of Kayexalate and potassium remained slightly elevated. -kayexelate -urine creatinine -urine sodium and -Transfusion as noted above -recheck this afternoon  5. Leukocytosis. WBC 13.1. Down from 15.3 6 days ago. Etiology uncertain. She did have gangrenous foot pre-opertively Suture line clean without drainage or erythema. Central line site unremarkable. She is nontoxic appearing. -Obtain chest x-ray -obtain rectal temp -Obtain a urinalysis -Monitor  6. History of GI bleed/gastric cancer. Recent diagnosis of DVT placed on Xarelto which was stopped after several days  due to melena. She had an upper endoscopy June 13 revealing 4 cm hiatal hernia large fungating mass. Biopsies were taken. Evaluated by oncology chemotherapy was scheduled to start last week however this was put on hold until she is recovered from. -Serial CBCs -Consider GI/heme oncology consult -consider palliative care consult  7. 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 History  of left lower extremity DVT. Status post IVC filter.  9. Hypotension. Chart review indicates patient's blood pressure baseline low end of normal. -monitor.  -transfuse as noted above   DVT prophylaxis: scd Code Status: full  Family Communication: communication  Disposition Plan: snf  Consults called: none  Admission status: inpatient   Black, Lezlie Octave NP [facility]   Attending MD note attesation:  Patient was seen, examined,treatment plan was discussed with the  Advance Practice Provider.  I have personally reviewed the clinical findings, lab,EKG, imaging studies and management of this patient in detail.I have also reviewed the orders written for this patient which were under my direction. I agree with the documentation, as recorded by the Advance Practice Provider.   See the italicized  for edits. Note entered on IP rehab side. I transferred documentation over.   Elwin Mocha, MD Family Medicine See Amion for pager # [facility]     Elwin Mocha MD [facility]  If 7PM-7AM, please contact night-coverage [email or web address] Password [facility]  10/16/2016, 12:55 PM

## 2016-09-28 NOTE — Progress Notes (Signed)
Patient denies needs/concerns at this time.  Safety maintained.

## 2016-09-28 NOTE — Progress Notes (Signed)
RT note; RT called to rapid response patient to protect airway per MD request, ABG drawn, assisted MD with intubation.

## 2016-09-28 NOTE — Discharge Summary (Signed)
Discharge summary job # [id number]

## 2016-09-28 NOTE — Progress Notes (Addendum)
Put in orders verbal read-back from [facility], run 2nd unit of blood then administer IV Lasix, place foley catheter.    Patient was complaining of anxiety and increased work of breathing, got order for 0.5mg  Ativan at 1620.  Went to give patient Ativan and found her unresponsive, tachypneic, performed sternal rub with no response. Increased patient's O2, placed patient on nonrebreather, called rapid response and respiratory. Gave 40mg  Lasix IV.   Patient was placed on BiPap by respiratory and was given Narcan 0.4 x2 doses. O2 saturations unable to read.  Patient continued to be unarousable and was intubated by respiratory. Patient was transferred to [facility] ICU.

## 2016-09-28 NOTE — Discharge Summary (Signed)
Jodi Collins, Jodi Collins      ACCOUNT NO.:  [id number]  MEDICAL RECORD NO.:  [id number]  LOCATION:                                 FACILITY:  PHYSICIAN:  Delice Lesch, MD        DATE OF BIRTH:  05/31/1950  DATE OF ADMISSION:  09/21/2016 DATE OF DISCHARGE:  09/27/2016                              DISCHARGE SUMMARY   DISCHARGE DIAGNOSES: 1. Decreased functional mobility secondary to left AKA on September 18, 2016. 2. SCDs for DVT prophylaxis. 3. Acute blood loss anemia with history of GI bleed. 4. History of deep vein thrombosis with IVC filter. 5. Hyperkalemia. 6. Leukocytosis. 7. Hyponatremia. 8. Thrombocytopenia. 9. Urinary retention. 10.Acute kidney injury. 11.Anasarca.  HISTORY OF PRESENT ILLNESS:  This is a 66 year old right-handed female, lives alone, independent prior to admission.  Works full time.  Noted history of DVT, pulmonary emboli in May of 2018, placed on Xarelto, discontinued due to melena.  Recently seen in the emergency room, shortness of breath, left lower extremity swelling, found to have right- sided segmental and subsegmental emboli, started on heparin therapy, discontinued after anemia, possible GI bleed.  She was transfused.  CT of abdomen and pelvis showed gastric stomach cancer, confirmed by EGD on August 30, 2016. She did receive an IVC filter.  Referrals made to Oncology Services.  On September 08, 2016, increasing leg pain, swelling. Noted necrotic changes of left lower extremity.  No Doppler signals. Underwent left AKA on September 18, 2016, per Dr. Ruta Hinds.  Latest hemoglobin 6.6, again transfused.  Hyperkalemia, received Kayexalate. Slow progressive gains.  The patient was admitted for comprehensive rehab program.  PAST MEDICAL HISTORY:  See discharge diagnoses.  SOCIAL HISTORY:  Lives alone, independent prior to admission.  FUNCTIONAL STATUS UPON ADMISSION TO REHAB SERVICES:  Minimal assist, sit to stand, minimal assist stand pivot  transfers, min to mod assist activities daily living.  PHYSICAL EXAMINATION:  VITAL SIGNS:  Blood pressure 96/61, pulse 99, temperature 97, respirations 18. GENERAL:  Alert female in no acute distress, pleasant, cooperative. HEENT:  EOMs intact. NECK:  Supple, nontender.  No JVD. CARDIAC:  Regular rate and rhythm without murmur. ABDOMEN:  Soft, nontender.  Good bowel sounds. EXTREMITIES:  She did have some lower extremity edema.  Amputation site was dressed, appropriately tender.  REHABILITATION HOSPITAL COURSE:  The patient was admitted to Inpatient Rehab Services with therapies initiated on a 3-hour daily basis, consisting of physical therapy, occupational therapy, and rehabilitation nursing.  The following issues were addressed during the patient's rehabilitation stay.  Pertaining to Mrs. Clarke's left AKA, site was dressed.  She would follow up with Vascular Surgery, Dr. Oneida Alar.  SCDs to right lower extremity for DVT prophylaxis.  Noted history of DVT, pulmonary emboli in the past.  She had been on Xarelto, discontinued due to GI bleed.  She now had an IVC filter placed.  Hydrocodone for pain. Acute blood loss anemia with history of GI bleed.  She was transfused. Hematology had followed the patient in the past.  Felt that anemia most likely due to recent surgery as well as history of gastric cancer, which was followed by Oncology Services and had been contacted prior to the  patient's departure from the rehab unit to be addressed as far as plan of care.  Hyponatremia at 128, thrombocytopenia.  She was on IV fluids. Monitored closely, She had some urinary retention.  Placed on low-dose Lasix.  Renal ultrasound showed some ascites.  Question plan to be tapped as well as gallbladder sludge.  AKI, creatinine 1.87, monitored closely while on IV fluids.  Noted anasarca, related to her history of gastric stomach cancer.  Again waiting plan from Oncology Services. During the patient's  rehab stay, she showed a generalized decline in her functional mobility, moderate assist for bed mobility, exercise limited tolerance.  Due to ongoing issues medically, multiorgan failure, felt the patient needed to be discharged to [place] which took place on September 28, 2016,  [place] MEDICATIONS AT TIME OF DICTATION:  Included Lasix 20 mg daily, hydrocodone 1-2 tablets every 4 hours as needed pain, 0.9% IV fluids 75 mL an hour.  At the time of discharge, the patient's condition was guarded.     Lauraine Rinne, P.A.   ______________________________ Delice Lesch, MD    DA/MEDQ  D:  09/20/2016  T:  10/09/2016  Job:  [id number]  cc:   Ladell Pier, M.D. Theda Belfast. [facility], M.D. Tory Emerald Benson [place], MD

## 2016-09-28 NOTE — Procedures (Signed)
Intubation Note  Patient in respiratory failure and AMS.  Patient given 2 mg IV versed, 100 mcg fentanyl, 20 mg of IV etomidate and 50 mg of IV rocuronium.  Sedated and paralyzed.  Size 7.5 ETT, 23 at the lips using a size 4 glide.  CXR ordered and pending.  Rush Farmer, M.D. [facility] Pulmonary/Critical [facility]. Pager: [phone]. After hours pager: [phone].

## 2016-09-28 NOTE — Consult Note (Signed)
PULMONARY / CRITICAL CARE MEDICINE   Name: Jodi Collins MRN: [id number] DOB: 21-Apr-1950    ADMISSION DATE:  10/12/2016 CONSULTATION DATE:  7/12  REFERRING MD:  Aggie Moats ([facility])   CHIEF COMPLAINT:  Respiratory failure   HISTORY OF PRESENT ILLNESS:   66yo female with hx DVT/PE in may 2018 for which she was started on xarelto, then discontinued in June r/t GI bleed and IVC filter was placed.  W/u for her GI bleeding confirmed gastric cancer. She was being followed by oncology with plans for chemotherapy.  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  Post op she was tx to CIR.  On 7/12 she was tx back to inpatient SDU with AKI, hyperkalemia, anasarca and respiratory failure thought r/t blood loss anemia.  She was started on bipap but continued to deteriorate.  PCCM consulted for ICU tx and pt was urgently intubated.   PAST MEDICAL HISTORY :  She  has a past medical history of Anemia; DVT (deep venous thrombosis) ([place]) (08/17/2016); Gangrene ([place]); GI bleed; Hyperkalemia; Hyponatremia; PE (pulmonary thromboembolism) ([place]) (08/28/2016); Stomach cancer ([place]); and Thrombocytopenia ([place]).  PAST SURGICAL HISTORY: She  has a past surgical history that includes IR IVC FILTER PLMT / S&I /IMG GUID/MOD SED (08/29/2016); Colonoscopy w/ polypectomy (10/2006 and 10/2011); Cervical conization w/bx (2012); Esophagogastroduodenoscopy (N/A, 08/30/2016); IR FLUORO GUIDE PORT INSERTION RIGHT (09/01/2016); IR US Guide Vasc Access Right (09/01/2016); and Amputation (Left, 09/18/2016).  Allergies  Allergen Reactions  . No Known Allergies     No current facility-administered medications on file prior to encounter.    Current Outpatient Prescriptions on File Prior to Encounter  Medication Sig  . dexamethasone (DECADRON) 4 MG tablet Take 2 tablets (8 mg total) by mouth daily. Start the day after chemotherapy for 2 days. Take with  food.  . feeding supplement, ENSURE ENLIVE, (ENSURE ENLIVE) LIQD Take 237 mLs by mouth 2 (two) times daily between meals.  . fluorouracil CALGB 42353 in sodium chloride 0.9 % 150 mL Inject into the vein over 96 hr. Every 2 weeks, wears pump for 46 hours  . leucovorin in dextrose 5 % 250 mL Inject into the vein once. Every 2 weeks  . lidocaine-prilocaine (EMLA) cream Apply to affected area once  . Misc. Devices [place] Wheelchair  . ondansetron (ZOFRAN) 8 MG tablet Take 1 tablet (8 mg total) by mouth 2 (two) times daily as needed for refractory nausea / vomiting. Start on day 3 after chemotherapy.  . OXALIPLATIN IV Inject into the vein. Every 2 weeks  . oxyCODONE-acetaminophen (PERCOCET/ROXICET) 5-325 MG tablet Take 1-2 tablets by mouth every 4 (four) hours as needed for severe pain.  . polyethylene glycol powder (GLYCOLAX/MIRALAX) powder 1 capful daily as needed (Patient taking differently: Take 17 g by mouth daily. )  . prochlorperazine (COMPAZINE) 10 MG tablet Take 1 tablet (10 mg total) by mouth every 6 (six) hours as needed (Nausea or vomiting).    FAMILY HISTORY:  Her indicated that her mother is alive. She indicated that her father is deceased. She indicated that all of her three sisters are alive.    SOCIAL HISTORY: She  reports that she has never smoked. She has never used smokeless tobacco. She reports that she drinks alcohol. She reports that she does not use drugs.  REVIEW OF SYSTEMS:   Unable. As per HPI.   SUBJECTIVE:    VITAL SIGNS: BP 106/68 (BP Location: Right Arm)  Pulse (!) 103   Temp 97.6 F (36.4 C) (Oral)   Resp (!) 30   LMP  (LMP Unknown)   SpO2 100%   HEMODYNAMICS:    VENTILATOR SETTINGS: Vent Mode: PCV;BIPAP FiO2 (%):  [100 %] 100 % Set Rate:  [15 bmp] 15 bmp  INTAKE / OUTPUT: No intake/output data recorded.  PHYSICAL EXAMINATION: General:  Acutely ill appearing female, near agonal breathing  Neuro:  Lethargic, minimal response  HEENT:  Mm moist,  bipap  Cardiovascular:  s1s2 rrr, tachy  Lungs:  resps near agonal, coarse, on bipap  Abdomen:  Distended, hypoactive bs  Musculoskeletal:  L AKA, RLE 2+ edema   LABS:  BMET  Recent Labs Lab 09/26/16 0442 09/27/16 0415 10/11/2016 0426  NA 129* 127* 128*  K 5.1 5.2* 5.3*  CL 102 100* 100*  CO2 17* 17* 17*  BUN 47* 56* 62*  CREATININE 1.15* 1.45* 1.87*  GLUCOSE 102* 107* 97    Electrolytes  Recent Labs Lab 09/26/16 0442 09/27/16 0415 09/27/2016 0426  CALCIUM 8.2* 7.9* 7.8*    CBC  Recent Labs Lab 09/26/16 0442 09/27/16 0415 10/09/2016 0426  WBC 12.8* 12.2* 13.1*  HGB 7.2* 6.4* 6.3*  HCT 23.1* 20.0* 20.2*  PLT 113* 120* 119*    Coag's No results for input(s): APTT, INR in the last 168 hours.  Sepsis Markers No results for input(s): LATICACIDVEN, PROCALCITON, O2SATVEN in the last 168 hours.  ABG  Recent Labs Lab 09/23/2016 1630  PHART 7.341*  PCO2ART 29.2*  PO2ART 184*    Liver Enzymes  Recent Labs Lab 09/22/16 0646  AST 44*  ALT 29  ALKPHOS 233*  BILITOT 0.9  ALBUMIN 1.7*    Cardiac Enzymes No results for input(s): TROPONINI, PROBNP in the last 168 hours.  Glucose  Recent Labs Lab 09/26/16 2051 09/27/16 0637 09/27/16 1135 09/27/16 1701 09/27/16 2123 10/07/2016 0656  GLUCAP 101* 101* 104* 110* 97 102*    Imaging Portable Chest 1 View  Result Date: 10/05/2016 CLINICAL DATA:  66 year old female with history of deep venous thrombosis, pulmonary thromboembolism, anemia and thrombocytopenia. IVC filter placement. EXAM: PORTABLE CHEST 1 VIEW COMPARISON:  Chest x-ray 09/25/2016. FINDINGS: Persistent elevation of the left hemidiaphragm. Opacities at the base of the left hemithorax may reflect underlying atelectasis and/or airspace consolidation, with superimposed small left pleural effusion. Right lung appears clear. Small right pleural effusion also suspected. No definite evidence of pulmonary edema. No pneumothorax. Heart size appears normal,  although partially obscured. Upper mediastinal contours are distorted by patient positioning, but there does appear to be right paratracheal soft tissue fullness concerning for lymphadenopathy. Right internal jugular single-lumen power porta cath with tip terminating at the superior cavoatrial junction. IMPRESSION: 1. Trace right and small left pleural effusions. 2. Left-sided diaphragmatic hernia again noted with probable atelectasis in the left lung base. 3. Right paratracheal soft tissue fullness concerning for lymphadenopathy. Electronically Signed   By: Vinnie Langton M.D.   On: 09/18/2016 09:40     STUDIES:  Renal u/s 7/10>>> 1. Normal appearing kidneys. 2. The bladder is empty with a Foley catheter in place. 3. Ascites. 4. Sludge in the gallbladder.  CULTURES: Blood 7/12>>> Urine 7/12>>> Sputum 7/12>>>  ANTIBIOTICS: Vanc 7/12>>> Zosyn 7/12>>>  SIGNIFICANT EVENTS: 7/12>> tx from rehab, resp failure, intubated   LINES/TUBES: ETT 7/12>>>   DISCUSSION: 66 year old female with extensive PMH including incurable stage 4 gastric adeno.  Now with anasarca, respiratory failure, anemia and acute renal failure now requiring intubation.  Needs palliative and family meeting with sister arrives.    ASSESSMENT / PLAN:  PULMONARY A: Acute hypoxic respiratory failure Hx of PE (previously on xarelto, s/p IVC filter 6/18) P:   Intubate now PRVC 8 cc/kg CXR now ABG in one hour CXR in am ABG now  CARDIOVASCULAR A:  Was hypotensive in the past but resolved now P:  ICU monitoring If hypotensive then will start levo.  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  The patient was admitted to SDU and was transfused.  Subsequently developed fluid overload and respiratory failure with AMS.  PCCM was called on consultation for respiratory failure.  Upon evaluation, patient was on BiPAP, completely unresponsive with coarse BS diffusely.  I reviewed CXR myself, pulmonary edema noted.  Diffuse anasarca on exam.  Will emergently intubate.  Full vent support.  GI, renal and H/O consults called.  Dr. Ulyses Amor report noted, no active bleeding at this time but very prone to it given anatomy but there is little that can be done about this.  If rebleeds then IR or surgery but doubt is a surgical candidate.  Being transfused one unit right now, will check CBC prior to writing for additional blood transfusion.  Hold all anti-coagulation (IVC filter in place).  Will need to address code status but sister is out of state and wishes for full code status.  Will need palliative care involved in AM.  The patient is critically ill with multiple organ systems failure and requires high complexity decision making for assessment and support, frequent evaluation and titration of therapies, application of advanced monitoring technologies and  extensive interpretation of multiple databases.   Critical Care Time devoted to patient care services described in this note is  35  Minutes. This time reflects time of care of this signee Dr Jennet Maduro. This critical care time does not reflect procedure time, or teaching time or supervisory time of PA/NP/Med student/Med Resident etc but could involve care discussion time.  Rush Farmer, M.D. [facility] Pulmonary/Critical Care Medicine. Pager: [phone]. After hours pager: [phone].  09/27/2016, 5:07 PM

## 2016-09-28 NOTE — Progress Notes (Signed)
Patient was discharged from our floor and readmitted on room 2c17, and gave report to [facility]

## 2016-09-28 NOTE — Consult Note (Signed)
Reason for Consult: AKI and hyperkalemia Referring Physician: Nelda Marseille, MD  Jodi Collins is an 66 y.o. female.  HPI: Ms. Height is a 66yo AAF with a PMH significant for DVT/pulmonary emboli in May 2018 who was placed on Xarelto until she developed a GIB and blood thinners were stopped and she underwent IVC filter placement.  She then had an EGD in June 2018 which revealed gastric cancer and was planning to start chemo therapy but developed left leg pain and swelling that rapidly progressed to necrosis related to arterial insufficiency and subsequently had a left AKA on 09/18/16.  She was transferred to inpatient rehab on 09/21/16 but her clinical course has worsened over the last week and today was readmitted to [facility] ICU after developing ABLA, AKI, anasarca, and worsening SOB.  She was intubated by PCCM due to acute respiratory failure and transferred to the ICU.  She has since been noted to have a cold right leg with mottled appearance and inability to obtain doppler flow in that extremity as well as rectal bleeding.  We were consulted to help evaluate and manage her AKI and hyperkalemia.  The trend in Scr is seen below.  Of note, she developed AKI during her last hospitalization when she presented with an ischemic left leg but her renal function returned to baseline following surgery.    Trend in Creatinine: Creatinine, Ser  Date/Time Value Ref Range Status  09/25/2016 04:26 AM 1.87 (H) 0.44 - 1.00 mg/dL Final  09/27/2016 04:15 AM 1.45 (H) 0.44 - 1.00 mg/dL Final  09/26/2016 04:42 AM 1.15 (H) 0.44 - 1.00 mg/dL Final  09/25/2016 04:11 AM 0.86 0.44 - 1.00 mg/dL Final  09/22/2016 06:46 AM 0.65 0.44 - 1.00 mg/dL Final  09/21/2016 03:16 AM 0.82 0.44 - 1.00 mg/dL Final  09/20/2016 02:43 AM 0.96 0.44 - 1.00 mg/dL Final  09/19/2016 02:52 AM 1.29 (H) 0.44 - 1.00 mg/dL Final  09/18/2016 09:49 PM 1.35 (H) 0.44 - 1.00 mg/dL Final  09/18/2016 10:35 AM 1.35 (H) 0.44 - 1.00 mg/dL Final  09/11/2016  08:17 PM 1.24 (H) 0.44 - 1.00 mg/dL Final  09/11/2016 08:52 AM 1.35 (H) 0.44 - 1.00 mg/dL Final  09/10/2016 05:36 AM 2.57 (H) 0.44 - 1.00 mg/dL Final  09/09/2016 06:42 AM 3.93 (H) 0.44 - 1.00 mg/dL Final  09/08/2016 06:34 PM 5.98 (H) 0.44 - 1.00 mg/dL Final  09/08/2016 12:47 PM 6.76 (H) 0.44 - 1.00 mg/dL Final  09/06/2016 02:10 PM 4.16 (H) 0.44 - 1.00 mg/dL Final  09/01/2016 04:10 AM 0.86 0.44 - 1.00 mg/dL Final  08/30/2016 12:01 AM 0.99 0.44 - 1.00 mg/dL Final  08/29/2016 11:43 AM 0.99 0.44 - 1.00 mg/dL Final  08/28/2016 03:47 PM 1.14 (H) 0.44 - 1.00 mg/dL Final    PMH:   Past Medical History:  Diagnosis Date  . Anemia   . DVT (deep venous thrombosis) ([place]) 08/17/2016  . 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 GI bleed   . Hyperkalemia   . Hyponatremia   . PE (pulmonary thromboembolism) ([place]) 08/28/2016  . Stomach cancer ([place])    stage IV, dx 08/30/16  . Thrombocytopenia ([facility])     PSH:   Past Surgical History:  Procedure Laterality Date  . AMPUTATION Left 09/18/2016   Procedure: AMPUTATION ABOVE KNEE-LEFT;  Surgeon: Elam Dutch, MD;  Location: [facility] OR;  Service: Vascular;  Laterality: Left;  . CERVICAL CONIZATION W/BX  2012   high grade squamous intraepithelial dysplasia.  Dr Garwin Brothers.   Marland Kitchen  COLONOSCOPY W/ POLYPECTOMY  10/2006 and 10/2011   Dr Mann.  had adenomatous polyps.    . ESOPHAGOGASTRODUODENOSCOPY N/A 08/30/2016   Procedure: ESOPHAGOGASTRODUODENOSCOPY (EGD);  Surgeon: Hung, Patrick, MD;  Location: [facility] ENDOSCOPY;  Service: Gastroenterology;  Laterality: N/A;  . IR FLUORO GUIDE PORT INSERTION RIGHT  09/01/2016  . IR IVC FILTER PLMT / S&I /IMG GUID/MOD SED  08/29/2016  . IR US GUIDE VASC ACCESS RIGHT  09/01/2016    Allergies:  Allergies  Allergen Reactions  . No Known Allergies     Medications:   Prior to Admission medications   Medication Sig Start Date End Date Taking? Authorizing Provider  dexamethasone (DECADRON) 4 MG tablet Take 2 tablets (8  mg total) by mouth daily. Start the day after chemotherapy for 2 days. Take with food. 09/11/16   Zhou, Louise, MD  feeding supplement, ENSURE ENLIVE, (ENSURE ENLIVE) LIQD Take 237 mLs by mouth 2 (two) times daily between meals. 09/21/16   Alekh, Kshitiz, MD  fluorouracil CALGB 80702 in sodium chloride 0.9 % 150 mL Inject into the vein over 96 hr. Every 2 weeks, wears pump for 46 hours    [provider]  leucovorin in dextrose 5 % 250 mL Inject into the vein once. Every 2 weeks    [provider]  lidocaine-prilocaine (EMLA) cream Apply to affected area once 09/07/16   Zhou, Louise, MD  Misc. Devices [place] Wheelchair 09/12/16   Zhou, Louise, MD  ondansetron (ZOFRAN) 8 MG tablet Take 1 tablet (8 mg total) by mouth 2 (two) times daily as needed for refractory nausea / vomiting. Start on day 3 after chemotherapy. 09/07/16   Zhou, Louise, MD  OXALIPLATIN IV Inject into the vein. Every 2 weeks    [provider]  oxyCODONE-acetaminophen (PERCOCET/ROXICET) 5-325 MG tablet Take 1-2 tablets by mouth every 4 (four) hours as needed for severe pain. 09/07/16   Zhou, Louise, MD  polyethylene glycol powder (GLYCOLAX/MIRALAX) powder 1 capful daily as needed Patient taking differently: Take 17 g by mouth daily.  09/11/16   Zhou, Louise, MD  prochlorperazine (COMPAZINE) 10 MG tablet Take 1 tablet (10 mg total) by mouth every 6 (six) hours as needed (Nausea or vomiting). 09/07/16   Zhou, Louise, MD    Inpatient medications: . chlorhexidine gluconate (MEDLINE KIT)  15 mL Mouth Rinse BID  . hydrocortisone sodium succinate  50 mg Intravenous Q6H  . [START ON 09/29/2016] mouth rinse  15 mL Mouth Rinse QID  . [START ON 09/29/2016] polyethylene glycol  17 g Oral Daily  . sodium chloride flush  3 mL Intravenous Q12H  . sodium polystyrene  30 g Oral Once    Discontinued Meds:   Medications Discontinued During This Encounter  Medication Reason  . polyethylene glycol powder (GLYCOLAX/MIRALAX)  container 17 g Duplicate  . oxyCODONE-acetaminophen (PERCOCET/ROXICET) 5-325 MG per tablet 1-2 tablet Duplicate  . furosemide (LASIX) injection 40 mg   . polyethylene glycol powder (GLYCOLAX/MIRALAX) container 17 g   . naloxone (NARCAN) 4 mg in dextrose 5 % 250 mL infusion   . LORazepam (ATIVAN) tablet 0.5 mg     Social History:  reports that she has never smoked. She has never used smokeless tobacco. She reports that she drinks alcohol. She reports that she does not use drugs.  Family History:   Family History  Problem Relation Age of Onset  . Hypertension Mother   . Diabetes Mother     Review of systems not obtained due to patient factors. Weight change:    No intake or output data in the 24 hours ending 10/03/2016 1757 BP 106/68 (BP Location: Right Arm)   Pulse (!) 103   Temp (!) 97.5 F (36.4 C) (Oral)   Resp (!) 30   Ht 5' 3" (1.6 m)   LMP  (LMP Unknown)   SpO2 100%  Vitals:   09/19/2016 1541 09/20/2016 1638 09/25/2016 1700 09/18/2016 1715  BP: 106/68     Pulse: (!) 103     Resp:      Temp: 97.6 F (36.4 C)   (!) 97.5 F (36.4 C)  TempSrc: Oral   Oral  SpO2: 98% 100% 100%   Height:   5' 3" (1.6 m)      General appearance: Intubated and sedated Head: Normocephalic, without obvious abnormality, atraumatic Eyes: negative findings: lids and lashes normal, conjunctivae and sclerae normal and corneas clear Resp: clear to auscultation bilaterally Cardio: no rub and tachycardic at 120 bpm GI: hypoactive bowel sounds, soft Extremities: edema 3+ edema of lower extremities and s/p left AKA, cold, cyanotic right foot not able to palpate pulse or obtain by doppler  Labs: Basic Metabolic Panel:  Recent Labs Lab 09/22/16 0646 09/25/16 0411 09/26/16 0442 09/27/16 0415 10/07/2016 0426  NA 133* 131* 129* 127* 128*  K 3.8 4.7 5.1 5.2* 5.3*  CL 106 103 102 100* 100*  CO2 21* 19* 17* 17* 17*  GLUCOSE 91 113* 102* 107* 97  BUN 22* 36* 47* 56* 62*  CREATININE 0.65 0.86 1.15* 1.45*  1.87*  ALBUMIN 1.7*  --   --   --   --   CALCIUM 8.0* 8.3* 8.2* 7.9* 7.8*   Liver Function Tests:  Recent Labs Lab 09/22/16 0646  AST 44*  ALT 29  ALKPHOS 233*  BILITOT 0.9  PROT 5.6*  ALBUMIN 1.7*   No results for input(s): LIPASE, AMYLASE in the last 168 hours. No results for input(s): AMMONIA in the last 168 hours. CBC:  Recent Labs Lab 09/22/16 0646 09/25/16 0411 09/26/16 0442 09/27/16 0415 09/27/2016 0426  WBC 15.3* 12.3* 12.8* 12.2* 13.1*  NEUTROABS 13.8*  --  11.0* 10.2* 11.0*  HGB 8.8* 8.0* 7.2* 6.4* 6.3*  HCT 27.5* 25.0* 23.1* 20.0* 20.2*  MCV 86.8 87.4 86.2 85.5 84.5  PLT 78* 106* 113* 120* 119*   PT/INR: @LABRCNTIP(inr:5) Cardiac Enzymes: )No results for input(s): CKTOTAL, CKMB, CKMBINDEX, TROPONINI in the last 168 hours. CBG:  Recent Labs Lab 09/27/16 1701 09/27/16 2123 09/26/2016 0656 09/26/2016 1631 10/06/2016 1712  GLUCAP 110* 97 102* 124* 94    Iron Studies: No results for input(s): IRON, TIBC, TRANSFERRIN, FERRITIN in the last 168 hours.  Xrays/Other Studies: Dg Chest Port 1 View  Result Date: 09/27/2016 CLINICAL DATA:  65 y/o  F; dyspnea. EXAM: PORTABLE CHEST 1 VIEW COMPARISON:  09/30/2016 chest radiograph FINDINGS: Massive left-sided diaphragmatic hernia. Endotracheal tube is 2.7 cm from carina. Enteric tube coils in the lower esophagus and tip is probably in the region of the proximal stomach. Stable cardiac silhouette given projection and technique. Increasing left-sided pleural effusion and atelectasis of left lung. No acute osseous abnormality is evident. IVC filter noted. Prominent bowel loops in upper abdomen partially visualize. IMPRESSION: 1. Endotracheal tube tip 2.7 cm from carina. 2. Enteric tube coronal zone lower esophagus with tip projecting over proximal stomach. Consider advancement. 3. Increasing left pleural effusion and left lung atelectasis. Electronically Signed   By: Lance  Furusawa-Stratton M.D.   On: 09/24/2016 17:40    Portable Chest 1 View  Result   Date: 09/27/2016 CLINICAL DATA:  66 year old female with history of deep venous thrombosis, pulmonary thromboembolism, anemia and thrombocytopenia. IVC filter placement. EXAM: PORTABLE CHEST 1 VIEW COMPARISON:  Chest x-ray 09/25/2016. FINDINGS: Persistent elevation of the left hemidiaphragm. Opacities at the base of the left hemithorax may reflect underlying atelectasis and/or airspace consolidation, with superimposed small left pleural effusion. Right lung appears clear. Small right pleural effusion also suspected. No definite evidence of pulmonary edema. No pneumothorax. Heart size appears normal, although partially obscured. Upper mediastinal contours are distorted by patient positioning, but there does appear to be right paratracheal soft tissue fullness concerning for lymphadenopathy. Right internal jugular single-lumen power porta cath with tip terminating at the superior cavoatrial junction. IMPRESSION: 1. Trace right and small left pleural effusions. 2. Left-sided diaphragmatic hernia again noted with probable atelectasis in the left lung base. 3. Right paratracheal soft tissue fullness concerning for lymphadenopathy. Electronically Signed   By: Vinnie Langton M.D.   On: 09/17/2016 09:40     Assessment/Plan: 1.  AKI- in setting of hypotension, worsening edema, ABLA, and ischemic right extremitiy.  Urine sodium of 10 consistent with pre-renal likely due to 3rd spacing and hypotension as well as ABLA.  However, she also has a h/o hypercoagulable state and off of blood thinners due to gastric cancer and ongoing GIB so renal vein thrombosis is also possible.  Will also check CK level due to her ischemic limb (this is very similar to her initial presentation of ischemic limb, ABLA, and AKI).  Will hold off on HD for now and give IV lasix to help with volume and hyperkalemia.  Secondary membranous GN is also a consideration given abrupt onset of anasarca over the last  week, await UA results. 2. ABLA- transfuse 3. VDRF- per PCCm 4. Ischemic right leg- will need to consult [facility] again to evaluate 5. Gastric cancer- was to start chemo, however has become critically ill 6. Hyperkalemia- due to AKI and multiple blood products.  Will give IV lasix and follow 7. Anasarca- did not have any proteinuria on UA 09/19/16, need to repeat. 8. Hypercoagulable state due to malignancy with DVT/PE- s/p IVC filter as unable to resume Xarelto with ongoing GIB 9. Hypervolemic hyponatremia- due to anasarca and hypoalbuminemia as well as AKI 10. Severe protein malnutrition 11. Disposition- poor overall prognosis.  Would recommend palliative care consulte to help set goals and limits of care.  Ongoing GIB and likely ischemic limb and inability to treat with blood thinners or even chemo.   Governor Rooks Andalyn Heckstall 10/10/2016, 5:57 PM

## 2016-09-28 NOTE — Progress Notes (Signed)
Notified by RN that patient developed increased work of breathing and lethargy with audible wheezes. Earlier pt given Lasix 40mg  given after 2nd unit PRBC's.   M.D. and NP arrived at bedside. Ordered blood sugar fingerstick. Blood sugar found to be 150s. Place pulse ox on multiple limbs unable to get tracing. Patient's extremities becoming cold.  PE: Gen. Quite lethargic not responding to painful stimulin CV: tachycardia, RLE with some cyanosis and cool. PPP Resp. Extended expiratory phase, using abdominal accessory muscles, audible wheezing Neuro: does not respond to  Painful stimuli  Stat chest x-ray ordered. Intubation was discussed but decision made to try patient on BiPAP. Narcan 4mg  given and patient awakened briefly and was able to follow commands, the patient was then redosed with the response less than before, ordered Narcan drip from pharmacist. ABG checked and patient did not have an acidemia or hypercarbia. Patient made little headway on BiPAP.    PCCM called and proceeded to intubate bedside and transfer to ICU  Dyanne Carrel, NP  See edits above.  Elwin Mocha MD

## 2016-09-28 NOTE — Progress Notes (Signed)
Social Work  Discharge Note  The overall goal for the admission was met for:   Discharge location: No-TRANSFERRING TO ACUTE UNIT  Length of Stay: No-7 DAYS  Discharge activity level: No-MIN-MOD LEVEL  Home/community participation: No  Services provided included: MD, RD, PT, OT, RN, CM, Pharmacy, Neuropsych and SW  Financial Services: Private Insurance: [facility]  Follow-up services arranged: Other: DECLINING IN THERAPIES AND MEDICALLY UNSTABLE TRANSFERRING TO ACUTE SERVICE  Comments (or additional information):PT BEGAN AND 2 DAY DECLINE-PIAN ISSUES, FLUID OVERLOAD, HEMOGLOBLIN DROPPING AND INABILITY TO GO TO THERAPIES, DUE TO THESE ISSUES [employer] HAS BEEN CONTACTED AND INFORMED OF TRANSFER  Patient/Family verbalized understanding of follow-up arrangements: Yes  Individual responsible for coordination of the follow-up plan: SELF & SISTER  Confirmed correct DME delivered: Elease Hashimoto 09/22/2016    Elease Hashimoto

## 2016-09-28 NOTE — Progress Notes (Signed)
   Daily Progress Note   Assessment/Planning:   Metastatic gastric cancer with thrombophilic complications, Non-salvageable R foot   R foot is dying.  Given the early stages of epidermolysis, no intervention is going to save this foot..    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  I don't think R AKA will make any difference in the final outcome.  Consider Palliative Consult for comfort measures   Subjective   Asked to see pt for R foot ischemia   Objective   Vitals:   10/16/2016 1715 10/08/2016 1730 10/15/2016 1745 10/06/2016 1800  BP: 118/80 114/73 110/84 102/75  Pulse: (!) 120  (!) 119 (!) 118  Resp: 18 16 16 20   Temp: (!) 97.5 F (36.4 C)     TempSrc: Oral     SpO2: 100%  95% 100%  Weight:    157 lb 10.1 oz (71.5 kg)  Height:         Intake/Output Summary (Last 24 hours) at 09/29/2016 1909 Last data filed at 10/14/2016 1800  Gross per 24 hour  Intake               10 ml  Output               60 ml  Net              -50 ml    PULM  Intubated on vent  CV  tachycardiac  GI  soft, NTND  VASC R foot cold with skin process of sloughing  NEURO Non-responsive    Laboratory   CBC CBC Latest Ref Rng & Units 10/09/2016 09/27/2016 09/26/2016  WBC 4.0 - 10.5 K/uL 13.1(H) 12.2(H) 12.8(H)  Hemoglobin 12.0 - 15.0 g/dL 6.3(LL) 6.4(LL) 7.2(L)  Hematocrit 36.0 - 46.0 % 20.2(L) 20.0(L) 23.1(L)  Platelets 150 - 400 K/uL 119(L) 120(L) 113(L)    BMET    Component Value Date/Time   NA 128 (L) 10/07/2016 0426   K 5.3 (H) 10/05/2016 0426   CL 100 (L) 10/12/2016 0426   CO2 17 (L) 10/11/2016 0426   GLUCOSE 97 09/29/2016 0426   BUN 62 (H) 10/07/2016 0426   CREATININE 1.87 (H) 10/01/2016 0426   CALCIUM 7.8 (L) 10/04/2016 0426   GFRNONAA 27 (L) 10/06/2016 0426   GFRAA 32 (L) 10/09/2016 0426     Adele Barthel, MD, FACS [facility] Office: [phone] Pager: [phone]  09/20/2016, 7:09 PM

## 2016-09-28 NOTE — Progress Notes (Signed)
Pharmacy Antibiotic Note  Annabella Elford is a 66 y.o. female admitted on 10/06/2016 with pneumonia and sepsis.  Pharmacy has been consulted for vancomycin and zosyn dosing.  Afebrile, wbc 13. Patient in acute renal failure with hypokalemia this morning. Rapid response/ccm called this afternoon for urgent intubation for respiratory failure. New orders to start empiric antibiotics.  Plan: Vancomycin 1500mg  IV x 1 then 750mg   IV every 24 hours hours.  Goal trough 15-20 mcg/mL. Zosyn 3.375g IV q8h (4 hour infusion).  Consider checking random level before redosing vancomycin if creatinine is up in the morning  Height: 5\' 3"  (160 cm) IBW/kg (Calculated) : 52.4  Temp (24hrs), Avg:97.8 F (36.6 C), Min:97.5 F (36.4 C), Max:98.4 F (36.9 C)   Recent Labs Lab 09/22/16 0646 09/25/16 0411 09/26/16 0442 09/27/16 0415 09/29/2016 0426  WBC 15.3* 12.3* 12.8* 12.2* 13.1*  CREATININE 0.65 0.86 1.15* 1.45* 1.87*    Estimated Creatinine Clearance: 28.6 mL/min (A) (by C-G formula based on SCr of 1.87 mg/dL (H)).    Allergies  Allergen Reactions  . No Known Allergies    Thank you for allowing pharmacy to be a part of this patient's care.  Erin Hearing PharmD., BCPS Clinical Pharmacist Pager [phone] 09/25/2016 5:55 PM

## 2016-09-28 NOTE — Significant Event (Signed)
Rapid Response Event Note  Overview:  Called by RN for assistance  Time Called: [id number] Arrival Time: 1625 Event Type: Respiratory  Initial Focused Assessment:  Called by RN for assistance foe patient who is unresponsive with increased WOB.  ON my arrival patient is unresponsive to sternal rub with labored breathing on NRB mask.  HR 120's, RR 30's, BP 122/65 Pulse ox 96% on NRB. Patient has 4+ pitting edema up to her groin bilaterally.     Interventions:  RT called for ABG and ventilator. Dr. Aggie Moats called to come to bedside.  Orders for narcan given, patient with no response.  Dr. Aggie Moats ordered for patient to be placed on BIPAP and a narcan drip.  CCM consulted and called to beside.  Dr. Nelda Marseille to bedside and emergently intubated patient.  Patient was given 2 mg Versed IV, 122mcg Fentanyl IV, 20 mg etomidate, and 50 mg rocuronium IV for intubation.    Plan of Care (if not transferred): Patient transported to ICU via bed with monitor and ventilator with Rapid response, primary RN and RT.    Event Summary:  Family has been updated by CCM.   at      at          [facility], Harlin Rain

## 2016-09-28 NOTE — Progress Notes (Signed)
During the shift, multiple attempts to straight cath patient but w/o success.  Urine would be in the cath tubing but would not come out.  Patient would tense up and bear down and unable to remove urine.  Bladder scanner shows >650 in the bladder.  Patient had foley cath removed 7/11 day shift and patient has had very little urine out since.

## 2016-09-29 ENCOUNTER — Encounter (HOSPITAL_COMMUNITY): Payer: 59

## 2016-09-29 ENCOUNTER — Inpatient Hospital Stay (HOSPITAL_COMMUNITY): Payer: 59

## 2016-09-29 ENCOUNTER — Other Ambulatory Visit (HOSPITAL_COMMUNITY): Payer: 59

## 2016-09-29 DIAGNOSIS — J9600 Acute respiratory failure, unspecified whether with hypoxia or hypercapnia: Secondary | ICD-10-CM

## 2016-09-29 DIAGNOSIS — R0600 Dyspnea, unspecified: Secondary | ICD-10-CM

## 2016-09-29 DIAGNOSIS — J9690 Respiratory failure, unspecified, unspecified whether with hypoxia or hypercapnia: Secondary | ICD-10-CM

## 2016-09-29 LAB — IMMUNOFIXATION, URINE

## 2016-09-29 LAB — COMPREHENSIVE METABOLIC PANEL
ALK PHOS: 129 U/L — AB (ref 38–126)
ALT: 20 U/L (ref 14–54)
ANION GAP: 12 (ref 5–15)
AST: 37 U/L (ref 15–41)
Albumin: 1.8 g/dL — ABNORMAL LOW (ref 3.5–5.0)
BUN: 67 mg/dL — ABNORMAL HIGH (ref 6–20)
CO2: 14 mmol/L — AB (ref 22–32)
CREATININE: 1.91 mg/dL — AB (ref 0.44–1.00)
Calcium: 7.9 mg/dL — ABNORMAL LOW (ref 8.9–10.3)
Chloride: 102 mmol/L (ref 101–111)
GFR, EST AFRICAN AMERICAN: 31 mL/min — AB (ref >60)
GFR, EST NON AFRICAN AMERICAN: 27 mL/min — AB (ref >60)
Glucose, Bld: 107 mg/dL — ABNORMAL HIGH (ref 65–99)
Potassium: 5.6 mmol/L — ABNORMAL HIGH (ref 3.5–5.1)
Sodium: 128 mmol/L — ABNORMAL LOW (ref 135–145)
Total Bilirubin: 1.2 mg/dL (ref 0.3–1.2)
Total Protein: 5.8 g/dL — ABNORMAL LOW (ref 6.5–8.1)

## 2016-09-29 LAB — TYPE AND SCREEN
ABO/RH(D): A POS
ANTIBODY SCREEN: NEGATIVE
UNIT DIVISION: 0
Unit division: 0

## 2016-09-29 LAB — CBC
HCT: 30.9 % — ABNORMAL LOW (ref 36.0–46.0)
HEMOGLOBIN: 10 g/dL — AB (ref 12.0–15.0)
MCH: 27.8 pg (ref 26.0–34.0)
MCHC: 32.4 g/dL (ref 30.0–36.0)
MCV: 85.8 fL (ref 78.0–100.0)
PLATELETS: 85 10*3/uL — AB (ref 150–400)
RBC: 3.6 MIL/uL — AB (ref 3.87–5.11)
RDW: 17.2 % — ABNORMAL HIGH (ref 11.5–15.5)
WBC: 18.6 10*3/uL — AB (ref 4.0–10.5)

## 2016-09-29 LAB — BLOOD GAS, ARTERIAL
Acid-base deficit: 10.6 mmol/L — ABNORMAL HIGH (ref 0.0–2.0)
Bicarbonate: 13.8 mmol/L — ABNORMAL LOW (ref 20.0–28.0)
DRAWN BY: 345601
FIO2: 60
O2 Saturation: 98.9 %
PEEP: 5 cmH2O
Patient temperature: 98.6
RATE: 20 resp/min
VT: 420 mL
pCO2 arterial: 25.1 mmHg — ABNORMAL LOW (ref 32.0–48.0)
pH, Arterial: 7.36 (ref 7.350–7.450)
pO2, Arterial: 128 mmHg — ABNORMAL HIGH (ref 83.0–108.0)

## 2016-09-29 LAB — GLUCOSE, CAPILLARY
GLUCOSE-CAPILLARY: 116 mg/dL — AB (ref 65–99)
Glucose-Capillary: 108 mg/dL — ABNORMAL HIGH (ref 65–99)
Glucose-Capillary: 118 mg/dL — ABNORMAL HIGH (ref 65–99)

## 2016-09-29 LAB — ECHOCARDIOGRAM COMPLETE
Height: 63 in
LDCA: 2.54 cm2
LVOTD: 18 mm
Weight: 2497.37 oz

## 2016-09-29 LAB — MAGNESIUM: MAGNESIUM: 2.1 mg/dL (ref 1.7–2.4)

## 2016-09-29 LAB — BPAM RBC
BLOOD PRODUCT EXPIRATION DATE: 201807192359
Blood Product Expiration Date: 201807192359
ISSUE DATE / TIME: 201807121038
ISSUE DATE / TIME: 201807121351
UNIT TYPE AND RH: 6200
Unit Type and Rh: 6200

## 2016-09-29 LAB — PROCALCITONIN: PROCALCITONIN: 7.37 ng/mL

## 2016-09-29 LAB — PROTIME-INR
INR: 1.38
PROTHROMBIN TIME: 17.1 s — AB (ref 11.4–15.2)

## 2016-09-29 LAB — PHOSPHORUS: PHOSPHORUS: 6.6 mg/dL — AB (ref 2.5–4.6)

## 2016-09-29 MED ORDER — PERFLUTREN LIPID MICROSPHERE
INTRAVENOUS | Status: AC
  Filled 2016-09-29: qty 10

## 2016-09-29 MED ORDER — SCOPOLAMINE 1 MG/3DAYS TD PT72
1.0000 | MEDICATED_PATCH | TRANSDERMAL | Status: DC
  Administered 2016-09-29: 1.5 mg via TRANSDERMAL
  Filled 2016-09-29: qty 1

## 2016-09-29 MED ORDER — PERFLUTREN LIPID MICROSPHERE
1.0000 mL | INTRAVENOUS | Status: AC | PRN
  Administered 2016-09-29: 2 mL via INTRAVENOUS
  Filled 2016-09-29: qty 10

## 2016-09-29 MED ORDER — SODIUM POLYSTYRENE SULFONATE 15 GM/60ML PO SUSP
30.0000 g | Freq: Once | ORAL | Status: AC
  Administered 2016-09-29: 30 g
  Filled 2016-09-29: qty 120

## 2016-09-29 MED ORDER — MIDAZOLAM HCL 2 MG/2ML IJ SOLN
1.0000 mg | INTRAMUSCULAR | Status: DC | PRN
  Administered 2016-09-29: 1 mg via INTRAVENOUS
  Filled 2016-09-29: qty 2

## 2016-09-29 MED ORDER — SODIUM CHLORIDE 0.9 % IV SOLN
0.0000 mg/h | INTRAVENOUS | Status: DC
  Administered 2016-09-29: 2 mg/h via INTRAVENOUS
  Filled 2016-09-29: qty 10

## 2016-09-29 NOTE — Progress Notes (Signed)
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  W/u for her GI bleeding confirmed gastric cancer. She was being followed by oncology with plans for chemotherapy.  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  Post op she was tx to CIR.  On 7/12 she was tx back to inpatient SDU with AKI, hyperkalemia, anasarca and respiratory failure thought r/t blood loss anemia.  She was started on bipap but continued to deteriorate.  PCCM consulted for ICU tx and pt was urgently intubated.    SUBJECTIVE:  Pt. Unresponsive on vent. Appears uncomfortable.  VITAL SIGNS: BP 91/72   Pulse (!) 101   Temp (!) 97.4 F (36.3 C) (Oral)   Resp (!) 25   Ht 5\' 3"  (1.6 m)   Wt 156 lb 1.4 oz (70.8 kg)   LMP  (LMP Unknown)   SpO2 100%   BMI 27.65 kg/m   HEMODYNAMICS:    VENTILATOR SETTINGS: Vent Mode: PRVC FiO2 (%):  [40 %-100 %] 40 % Set Rate:  [15 bmp-20 bmp] 20 bmp Vt Set:  [420 mL] 420 mL PEEP:  [5 cmH20] 5 cmH20 Plateau Pressure:  [15 cmH20-29 cmH20] 29 cmH20  INTAKE / OUTPUT: I/O last 3 completed shifts: In: 120 [I.V.:10; Other:110] Out: 680 [Urine:580; Emesis/NG output:100]  PHYSICAL EXAMINATION: General:  Acutely ill appearing female, intubated and lightly sedated on vent Neuro: Minimally responsive, turns head, moves left side> R  with stimulus, follows no commands HEENT: Normocephalic, atraumatic, L eye droop, ? Left eye deviation Cardiovascular:  s1s2 rrr, tachy, No RMG  Lungs: Coarse throughout, will over breath vent with stimulation Abdomen:   Distended, firm, tender to palpation, hypoactive bs  Musculoskeletal:  L AKA, RLE dark and cold to touch, 2+ edema, pulses with doppler only   LABS:  BMET  Recent Labs Lab 10/13/2016 0426 09/22/2016 2216 09/29/16 0538  NA 128* 129* 128*  K 5.3* 5.6* 5.6*  CL 100* 102 102  CO2 17* 17* 14*  BUN 62* 62* 67*  CREATININE 1.87* 1.86* 1.91*  GLUCOSE 97 127* 107*    Electrolytes  Recent Labs Lab 10/02/2016 0426 09/19/2016 2216 09/29/16 0538  CALCIUM 7.8* 7.9* 7.9*  MG  --   --  2.1  PHOS  --   --  6.6*    CBC  Recent Labs Lab 09/27/2016 0426 09/17/2016 2216 09/29/16 0538  WBC 13.1* 18.8* 18.6*  HGB 6.3* 10.2* 10.0*  HCT 20.2* 31.7* 30.9*  PLT 119* 89* 85*    Coag's  Recent Labs Lab 09/29/16 0538  INR 1.38    Sepsis Markers  Recent Labs Lab 09/22/2016 2216 09/29/16 0538  PROCALCITON 6.39 7.37    ABG  Recent Labs Lab 10/11/2016 1630 09/27/2016 1754 09/29/16 0342  PHART 7.341* 7.217* 7.360  PCO2ART 29.2* 43.6 25.1*  PO2ART 184* 79.9* 128*    Liver Enzymes  Recent Labs Lab 09/29/16 0538  AST 37  ALT 20  ALKPHOS 129*  BILITOT 1.2  ALBUMIN 1.8*    Cardiac Enzymes No results for input(s): TROPONINI, PROBNP in the  last 168 hours.  Glucose  Recent Labs Lab 09/27/2016 0656 10/03/2016 1631 10/05/2016 1712 09/19/2016 2353 09/29/16 0345 09/29/16 0838  GLUCAP 102* 124* 94 116* 116* 108*    Imaging Dg Chest Port 1 View  Result Date: 09/29/2016 CLINICAL DATA:  Hypoxia EXAM: PORTABLE CHEST 1 VIEW COMPARISON:  September 28, 2016 chest radiograph; chest CT August 28, 2016 FINDINGS: Endotracheal tube tip is 2.2 cm above the carina. Nasogastric tube position is unchanged with a portion of the catheter pulled an a likely hiatal hernia with the tip at the level of the right hemidiaphragm, probably in the proximal stomach given a hiatal type hernia. Port-A-Cath tip is in the superior vena cava. There is no evident pneumothorax. There is persistent atelectatic change in the  left base with small left pleural effusion. Right lung is clear. Heart is upper normal in size. The pulmonary vascular is within normal limits. No evident adenopathy. No bone lesions. IMPRESSION: Tube and catheter positions as described without pneumothorax. A porch portion of the nasogastric tube is likely well either within the distal esophagus for at the junction of the distal esophagus and hiatal hernia. Tip and side port are likely within hiatal hernia. It may be prudent to consider withdrawing nasogastric tube in than advancing to eliminate sequela and advanced the nasogastric tube more distally into the stomach. The appearance is stable compared to 1 day prior. There is elevation left hemidiaphragm with left lower lobe region atelectasis and left pleural effusion. Stable cardiac silhouette. Right lung clear. Electronically Signed   By: Lowella Grip III M.D.   On: 09/29/2016 07:18   Dg Chest Port 1 View  Result Date: 09/21/2016 CLINICAL DATA:  66 y/o  F; dyspnea. EXAM: PORTABLE CHEST 1 VIEW COMPARISON:  09/17/2016 chest radiograph FINDINGS: Massive left-sided diaphragmatic hernia. Endotracheal tube is 2.7 cm from carina. Enteric tube coils in the lower esophagus and tip is probably in the region of the proximal stomach. Stable cardiac silhouette given projection and technique. Increasing left-sided pleural effusion and atelectasis of left lung. No acute osseous abnormality is evident. IVC filter noted. Prominent bowel loops in upper abdomen partially visualize. IMPRESSION: 1. Endotracheal tube tip 2.7 cm from carina. 2. Enteric tube coronal zone lower esophagus with tip projecting over proximal stomach. Consider advancement. 3. Increasing left pleural effusion and left lung atelectasis. Electronically Signed   By: Kristine Garbe M.D.   On: 10/05/2016 17:40   Portable Chest 1 View  Result Date: 09/25/2016 CLINICAL DATA:  66 year old female with history of deep venous thrombosis, pulmonary  thromboembolism, anemia and thrombocytopenia. IVC filter placement. EXAM: PORTABLE CHEST 1 VIEW COMPARISON:  Chest x-ray 09/25/2016. FINDINGS: Persistent elevation of the left hemidiaphragm. Opacities at the base of the left hemithorax may reflect underlying atelectasis and/or airspace consolidation, with superimposed small left pleural effusion. Right lung appears clear. Small right pleural effusion also suspected. No definite evidence of pulmonary edema. No pneumothorax. Heart size appears normal, although partially obscured. Upper mediastinal contours are distorted by patient positioning, but there does appear to be right paratracheal soft tissue fullness concerning for lymphadenopathy. Right internal jugular single-lumen power porta cath with tip terminating at the superior cavoatrial junction. IMPRESSION: 1. Trace right and small left pleural effusions. 2. Left-sided diaphragmatic hernia again noted with probable atelectasis in the left lung base. 3. Right paratracheal soft tissue fullness concerning for lymphadenopathy. Electronically Signed   By: Vinnie Langton M.D.   On: 09/18/2016 09:40     STUDIES:  Renal u/s 7/10>>> 1.  Normal appearing kidneys. 2. The bladder is empty with a Foley catheter in place. 3. Ascites. 4. Sludge in the gallbladder.  CULTURES: Blood 7/12>>> Urine 7/12>>> Sputum 7/12>>> GS>> Abundant WBC, PMN and Mononuclear, Rare gram + cocci in pairs  ANTIBIOTICS: Vanc 7/12>>> Zosyn 7/12>>>  SIGNIFICANT EVENTS: 7/12>> tx from rehab, resp failure, intubated    LINES/TUBES: ETT 7/12>>>   DISCUSSION: 66 year old female with extensive PMH including incurable stage 4 gastric adeno.  Now with anasarca, respiratory failure, anemia and acute renal failure now requiring intubation.  Needs palliative and family meeting with sister once they arrive.    ASSESSMENT / PLAN:  PULMONARY A: Acute hypoxic respiratory failure Hx of PE (previously on xarelto, s/p IVC filter  6/18) P:   PRVC 8 cc/kg CXR prn SBT / wean as able after 24 hours VAP precautions Titrate oxygen for saturations > 94   CARDIOVASCULAR A:  Hypotension>> resolving, but BP remains soft P:  ICU monitoring Levo as needed for MAP sustained > 65   RENAL A:   AKI>> creatinine worsening overnight 7/12 Hyperkalemia hypotnatremia P:   Renal consult called Trend BMP / urinary output Replace electrolytes as indicated Avoid nephrotoxic agents, ensure adequate renal perfusion Kayexelate now Re-check K at 1700  GASTROINTESTINAL A:   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 prone to bleed per GI -per GI, no GI interventions possible other than to treat the cancer -[place] bark bloody OG drainage P:   Consider surgical or IR consult if rebleeds, but doubt surgical candidate Transfusing for Hgb goal >7 GI has signed off  HEMATOLOGIC A:   Anemia Thrombocytopenia P: Hold all anticoagulation/ IVC in place  CBC daily Monitor for blood loss  Transfuse for Hgb  <7  INFECTIOUS A:   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 Is critically ill with non-reversible disease process. Right leg now swollen and cold, discolored.Surgical assessment  Is patient is in early stages of dying, likely thrombosing vascular beds. She is not a surgical candidate for R AKA. Pt. Appears uncomfortable with any stimulation. Sister is on her way from out of town to discuss goals of care, and code status. Hopefully she will agree it is time to transition to Palliation and  comfort measures  so we can align goals of care with appropriate plan of care: One way extubation and comfort measures.  - Inter-disciplinary family meet or Palliative Care meeting due by:  day [place], [facility] Page Pager # [phone] 09/29/2016 10:08 am

## 2016-09-29 NOTE — Progress Notes (Signed)
Pt transferred to 6N15. Family, pt's belongings with the patient. Morphine @ 81ml/hr. No distress noted.

## 2016-09-29 NOTE — Progress Notes (Signed)
CSW was consulted for B and E for pt and family. CSW met with pt's family at bedside where support was offered to family during this time. CSW notified pt family that CSW was there to offer support (which they said they didn't at this time) and encouraged family to notify RN if family would like to speak to a Education officer, museum. CSW will continue to assist with needs of pt and family.   Virgie Dad Elia Nunley, MSW, [place] Clinical Social Worker [phone]

## 2016-09-29 NOTE — Progress Notes (Signed)
Patient arrived from [place] to 6n15, unresponsive, on a morphine drip at 34ml/hr. Urinary catheter and rectal foley intact. IV intact and infusing. Patient's sister at bedside and stated she looks comfortable just breathing a little more labored, to which I told her that with the transfer it could be strenuous so we will give her time to adjust and re-assess if we need to titrate morphine drip. Oriented the family member to the room and staff, will continue to monitor.

## 2016-09-29 NOTE — Progress Notes (Signed)
I spoke with Jodi Collins's Sister's ( One on the phone and one present) and close friend regarding Jodi Collins's decline over the last 24 hours . The sisters are HCPOA for Jodi Collins.  We discussed that she is now on a ventilator, and that she is not mentally aware of what is going on. I explained that her right leg is cold and her foot is in the early stages of dying.I explained that the vascular surgeon assessed the leg this morning for amputation, and that she is not a surgical candidate. 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 We discussed that any aggressive care at this Collins would not change outcomes, but would most likely be uncomfortable for Jodi Collins.Both of Jodi Collins sister's have been through this before with their father who also died of gastric cancer.They had discussed this with one another last night after their sisters change in status, and what it meant. They feel they know that Jodi Collins would not want to be kept alive with artificial means, with poor quality of life. We discussed withdrawal of life support, and transitioning goals of care to comfort care  to allow Jodi Collins a peaceful death .I explained that this would include making Jodi Collins a DNR, and removing the ETT once Jodi Collins is comfortable on a Morphine drip. They understand that the ETT will not be replaced, and they are in agreement with this plan. They truly have their sisters best interest and well being at heart.They would like to be present during this process. We discussed that there is no way of knowing how long asystole will take to occur, that everyone is a bit different. I explained that if it is prolonged, we will transfer the patient to a regular room for family comfort.I also explained that the nurse will communicate with them on a regular basis throughout the process. They verbalized understanding and were going to call their mother to explain the  situation. Plan is to transport the patient back to [place] for burial. They have made the arrangements.Dr. Vaughan Browner was made aware of there above and Withdrawal of Life Support Orders have been entered in EPIC.  Magdalen Spatz, AGACNP-BC [facility] Pulmonary/Critical Care Medicine Pager # [phone] .09/29/2016

## 2016-09-29 NOTE — Progress Notes (Addendum)
S:intubated O:BP 91/72   Pulse (!) 101   Temp 97.7 F (36.5 C) (Oral)   Resp (!) 25   Ht 5' 3"  (1.6 m)   Wt 70.8 kg (156 lb 1.4 oz)   LMP  (LMP Unknown)   SpO2 100%   BMI 27.65 kg/m   Intake/Output Summary (Last 24 hours) at 09/29/16 0835 Last data filed at 09/29/16 0600  Gross per 24 hour  Intake              120 ml  Output              680 ml  Net             -560 ml   Weight change:  JHE:RDEYCXKGY CVS:RRR Resp: Decreased BS Lt base Abd: + BS + SQ edema, ND Ext: Lt AKA.  4+ edema of both lower extremities NEURO:intubated   . chlorhexidine gluconate (MEDLINE KIT)  15 mL Mouth Rinse BID  . furosemide  80 mg Intravenous BID  . hydrocortisone sodium succinate  50 mg Intravenous Q6H  . mouth rinse  15 mL Mouth Rinse QID  . polyethylene glycol  17 g Oral Daily  . sodium chloride flush  3 mL Intravenous Q12H  . sodium polystyrene  30 g Oral Once   Dg Chest Port 1 View  Result Date: 09/29/2016 CLINICAL DATA:  Hypoxia EXAM: PORTABLE CHEST 1 VIEW COMPARISON:  September 28, 2016 chest radiograph; chest CT August 28, 2016 FINDINGS: Endotracheal tube tip is 2.2 cm above the carina. Nasogastric tube position is unchanged with a portion of the catheter pulled an a likely hiatal hernia with the tip at the level of the right hemidiaphragm, probably in the proximal stomach given a hiatal type hernia. Port-A-Cath tip is in the superior vena cava. There is no evident pneumothorax. There is persistent atelectatic change in the left base with small left pleural effusion. Right lung is clear. Heart is upper normal in size. The pulmonary vascular is within normal limits. No evident adenopathy. No bone lesions. IMPRESSION: Tube and catheter positions as described without pneumothorax. A porch portion of the nasogastric tube is likely well either within the distal esophagus for at the junction of the distal esophagus and hiatal hernia. Tip and side port are likely within hiatal hernia. It may be prudent to  consider withdrawing nasogastric tube in than advancing to eliminate sequela and advanced the nasogastric tube more distally into the stomach. The appearance is stable compared to 1 day prior. There is elevation left hemidiaphragm with left lower lobe region atelectasis and left pleural effusion. Stable cardiac silhouette. Right lung clear. Electronically Signed   By: Lowella Grip III M.D.   On: 09/29/2016 07:18   Dg Chest Port 1 View  Result Date: 10/15/2016 CLINICAL DATA:  66 y/o  F; dyspnea. EXAM: PORTABLE CHEST 1 VIEW COMPARISON:  10/09/2016 chest radiograph FINDINGS: Massive left-sided diaphragmatic hernia. Endotracheal tube is 2.7 cm from carina. Enteric tube coils in the lower esophagus and tip is probably in the region of the proximal stomach. Stable cardiac silhouette given projection and technique. Increasing left-sided pleural effusion and atelectasis of left lung. No acute osseous abnormality is evident. IVC filter noted. Prominent bowel loops in upper abdomen partially visualize. IMPRESSION: 1. Endotracheal tube tip 2.7 cm from carina. 2. Enteric tube coronal zone lower esophagus with tip projecting over proximal stomach. Consider advancement. 3. Increasing left pleural effusion and left lung atelectasis. Electronically Signed   By: Edgardo Roys.D.  On: 09/29/2016 17:40   Portable Chest 1 View  Result Date: 09/27/2016 CLINICAL DATA:  66 year old female with history of deep venous thrombosis, pulmonary thromboembolism, anemia and thrombocytopenia. IVC filter placement. EXAM: PORTABLE CHEST 1 VIEW COMPARISON:  Chest x-ray 09/25/2016. FINDINGS: Persistent elevation of the left hemidiaphragm. Opacities at the base of the left hemithorax may reflect underlying atelectasis and/or airspace consolidation, with superimposed small left pleural effusion. Right lung appears clear. Small right pleural effusion also suspected. No definite evidence of pulmonary edema. No pneumothorax. Heart  size appears normal, although partially obscured. Upper mediastinal contours are distorted by patient positioning, but there does appear to be right paratracheal soft tissue fullness concerning for lymphadenopathy. Right internal jugular single-lumen power porta cath with tip terminating at the superior cavoatrial junction. IMPRESSION: 1. Trace right and small left pleural effusions. 2. Left-sided diaphragmatic hernia again noted with probable atelectasis in the left lung base. 3. Right paratracheal soft tissue fullness concerning for lymphadenopathy. Electronically Signed   By: Vinnie Langton M.D.   On: 09/20/2016 09:40   BMET    Component Value Date/Time   NA 128 (L) 09/29/2016 0538   K 5.6 (H) 09/29/2016 0538   CL 102 09/29/2016 0538   CO2 14 (L) 09/29/2016 0538   GLUCOSE 107 (H) 09/29/2016 0538   BUN 67 (H) 09/29/2016 0538   CREATININE 1.91 (H) 09/29/2016 0538   CALCIUM 7.9 (L) 09/29/2016 0538   GFRNONAA 27 (L) 09/29/2016 0538   GFRAA 31 (L) 09/29/2016 0538   CBC    Component Value Date/Time   WBC 18.6 (H) 09/29/2016 0538   RBC 3.60 (L) 09/29/2016 0538   HGB 10.0 (L) 09/29/2016 0538   HCT 30.9 (L) 09/29/2016 0538   PLT 85 (L) 09/29/2016 0538   MCV 85.8 09/29/2016 0538   MCH 27.8 09/29/2016 0538   MCHC 32.4 09/29/2016 0538   RDW 17.2 (H) 09/29/2016 0538   LYMPHSABS 1.6 09/20/2016 0426   MONOABS 0.5 10/08/2016 0426   EOSABS 0.0 10/05/2016 0426   BASOSABS 0.0 09/27/2016 0426     Assessment:  1. AKI sec hypotension.  UO sl improved.  Scr only fractionally higher.  UA shows only 41m protein so unlikely underlying glomerulopathy. 2. Gastric cancer 3. Hyperkalemia 4. VDRF 5. Ischemic Rt lower leg  Plan: 1.  Kayexalate per NG tube 2.  Decision should be made on how aggressive to be given underlying cancer.  I think comfort care would be appropriate as prognosis is quite poor.  If not then would need bicarb gtt.  Spoke with Dr MVaughan Browner 3. Cont with lasix for  now  Deandre Stansel T

## 2016-09-29 NOTE — Progress Notes (Signed)
Responded to consult for end of life. Provided emotional/spiritual/grief support, ministry of presence, and prayer in consultation rm to pt's younger sister from [place] (to where pt's body will be returned) and pt's neighbor here [id number]'d picked former up at airport this morning and brought her here)..   Sister had just called all family members, ensuring they all knew before asking nurse to implement comfort care. Sister had arranged for pt's mom in particular to hear the news in presence of her pastor and was checking up on here. Just about 20 yrs ago precisely, pt now dying had called sister now visiting pt now dying to let sister know their dad had died. That was bothering sister mow here as well.   Sister is strong, and feels she needs to be strong for family, but at same time realizes that she didn't deal w/ her dad's death 20 yrs ago at time, but helped all others, so was hit hard several months later when they were ready to move on. She is wise to be conscious of trying to avoid doing that now.   Family is strong in faith, and sister is comforted in that. She knows that chaplin is available for f/u.    09/29/16 1300  Clinical Encounter Type  Visited With Patient;Family;Health care provider  Visit Type Initial;Psychological support;Spiritual support;Social support;Other (Comment) (to be transitioned to comfort care)  Referral From Nurse  Spiritual Encounters  Spiritual Needs Prayer;Emotional;Grief support  Stress Factors  Patient Stress Factors Health changes;Loss of control  Family Stress Factors Family relationships;Health changes;Loss;Loss of control   Gerrit Heck, Chaplain

## 2016-09-29 NOTE — Care Management Note (Addendum)
Case Management Note  Patient Details  Name: Jodi Collins MRN: [id number] Date of Birth: [birth year]  Subjective/Objective:                 Patient recently discharged w [facility] for [facility]. From home w family in [place]. Currently extubated with morphine gtt titrated for comfort.  PCP R Baird Cancer Coverage [facility] Medicare   Action/Plan:   Expected Discharge Date:                  Expected Discharge Plan:  [place]  In-House Referral:     Discharge planning Services  CM Consult  Post Acute Care Choice:    Choice offered to:     DME Arranged:    DME Agency:     HH Arranged:    [place] Agency:     Status of Service:  In process, will continue to follow  If discussed at Long Length of Stay Meetings, dates discussed:    Additional Comments:  Carles Collet, RN 09/29/2016, 3:20 PM

## 2016-09-29 NOTE — Progress Notes (Signed)
Pt extubated. Morphine drip titrated per comfort. Family at the bedside. Emotional support was given.

## 2016-09-29 NOTE — Progress Notes (Signed)
MD spoke to family. Comfort care will be implemented.  VS stable, patient responsive to pain. Does not follow commands. Family at the bedside. Emotional support given. See new orders.

## 2016-09-29 NOTE — Progress Notes (Signed)
*  PRELIMINARY RESULTS* 2D Echocardiogram has been performed.  Jodi Collins 09/29/2016, 11:59 AM

## 2016-09-29 NOTE — Procedures (Signed)
Extubation Procedure Note  Patient Details:   Name: Jodi Collins DOB: 1950-09-20 MRN: [id number]   Airway Documentation:     Evaluation  O2 sats: stable throughout and currently acceptable Complications: Terminal extubation. Patient did tolerate procedure well. Bilateral Breath Sounds: Clear, Diminished   No  Miquel Dunn 09/29/2016, 2:23 PM

## 2016-10-01 LAB — CULTURE, RESPIRATORY W GRAM STAIN

## 2016-10-01 LAB — CULTURE, RESPIRATORY: CULTURE: NORMAL

## 2016-10-02 MED FILL — Perflutren Lipid Microsphere IV Susp 1.1 MG/ML: INTRAVENOUS | Qty: 10 | Status: AC

## 2016-10-04 ENCOUNTER — Encounter (HOSPITAL_COMMUNITY): Payer: Self-pay | Admitting: Emergency Medicine

## 2016-10-04 LAB — CULTURE, BLOOD (ROUTINE X 2)
CULTURE: NO GROWTH
Culture: NO GROWTH
SPECIAL REQUESTS: ADEQUATE
Special Requests: ADEQUATE

## 2016-10-05 ENCOUNTER — Telehealth: Payer: Self-pay

## 2016-10-05 NOTE — Telephone Encounter (Signed)
On 10/05/2016 I received a death certificate from [facility] (original). The death certificate is for burial. The patient is a patient of Doctor Maneem. The death certificate will be taken to Pulmonary Unit @ [facility] this pm.  On 10/10/16 d/c was confirmed that it was mailed to vital records.  [id number] mc

## 2016-10-06 NOTE — Telephone Encounter (Signed)
Picked up D/C at Pulmonary and called [employer] [place] said to mail it to them [place] 10/06/16

## 2016-10-18 NOTE — Progress Notes (Signed)
Shift assessment complete. Patient resting comfortably, respirations even and unlabored. Morphine infusing continuously at 10mg /hr. Patient's sister at bedside, no questions or concerns voiced at this time.

## 2016-10-18 NOTE — Progress Notes (Signed)
I, Barrett Henle, Charge RN, witnessed Manson Allan, Chief Operating Officer 75cc of iv morphine from morphine drip.

## 2016-10-18 NOTE — Discharge Summary (Signed)
Physician Discharge Summary  Patient ID: Jodi Collins MRN: [id number] DOB/AGE: 09/09/1950 66 y.o.  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  W/u for her GI bleeding confirmed gastric cancer. She was being followed by oncology with plans for chemotherapy.  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  Post op she was tx to CIR.  On 7/12 she was tx back to inpatient SDU with AKI, hyperkalemia, anasarca and respiratory failure thought r/t blood loss anemia.  She was started on bipap but continued to deteriorate.  PCCM consulted for ICU tx and pt was urgently intubated.   Noted to have ischemic right foot. Vascular surgery was consulted But the patient was too sick to undergo any intervention. Nephrology was also consulted for acute kidney injury. She remained critically ill with poor prognosis for recovery.This has been discussed with the family and the decision has been made to proceed to comfort measures she was terminally extubated and passed away on 2016/10/26  Consults:  Vascular surgery, Gastroenterology, Nephrology.   Disposition: 20-Expired   Allergies as of 10/26/16      Reactions   No Known Allergies       Medication List    ASK your doctor about these medications   dexamethasone 4 MG tablet Commonly known as:  DECADRON Take 2 tablets (8 mg total) by mouth daily. Start the day after chemotherapy for 2 days. Take with food.   feeding supplement (ENSURE ENLIVE) Liqd Take 237 mLs by mouth 2 (two)  times daily between meals.   fluorouracil CALGB 55208 in sodium chloride 0.9 % 150 mL Inject into the vein over 96 hr. Every 2 weeks, wears pump for 46 hours   leucovorin in dextrose 5 % 250 mL Inject into the vein once. Every 2 weeks   lidocaine-prilocaine cream Commonly known as:  EMLA Apply to affected area once   Misc. Devices Misc Wheelchair   ondansetron 8 MG tablet Commonly known as:  ZOFRAN Take 1 tablet (8 mg total) by mouth 2 (two) times daily as needed for refractory nausea / vomiting. Start on day 3 after chemotherapy.   OXALIPLATIN IV Inject into the vein. Every 2 weeks   oxyCODONE-acetaminophen 5-325 MG tablet Commonly known as:  PERCOCET/ROXICET Take 1-2 tablets by mouth every 4 (four) hours as needed for severe pain.   polyethylene glycol powder powder Commonly known as:  GLYCOLAX/MIRALAX 1 capful daily as needed   prochlorperazine 10 MG tablet Commonly known as:  COMPAZINE Take 1 tablet (10 mg total) by mouth every 6 (six) hours as needed (Nausea or vomiting).        Signed: Katrece Roediger 10/06/2016, 1:25 PM

## 2016-10-18 DEATH — deceased

## 2016-11-01 ENCOUNTER — Other Ambulatory Visit: Payer: Self-pay | Admitting: Nurse Practitioner

## 2018-06-18 IMAGING — DX DG CHEST 2V
2 series · 2 of 2 positions shown · non-contrast
Comparison: Chest x-ray dated 09/18/2016

CLINICAL DATA: Body edema.

EXAM:
CHEST  2 VIEW

[x chest ap]
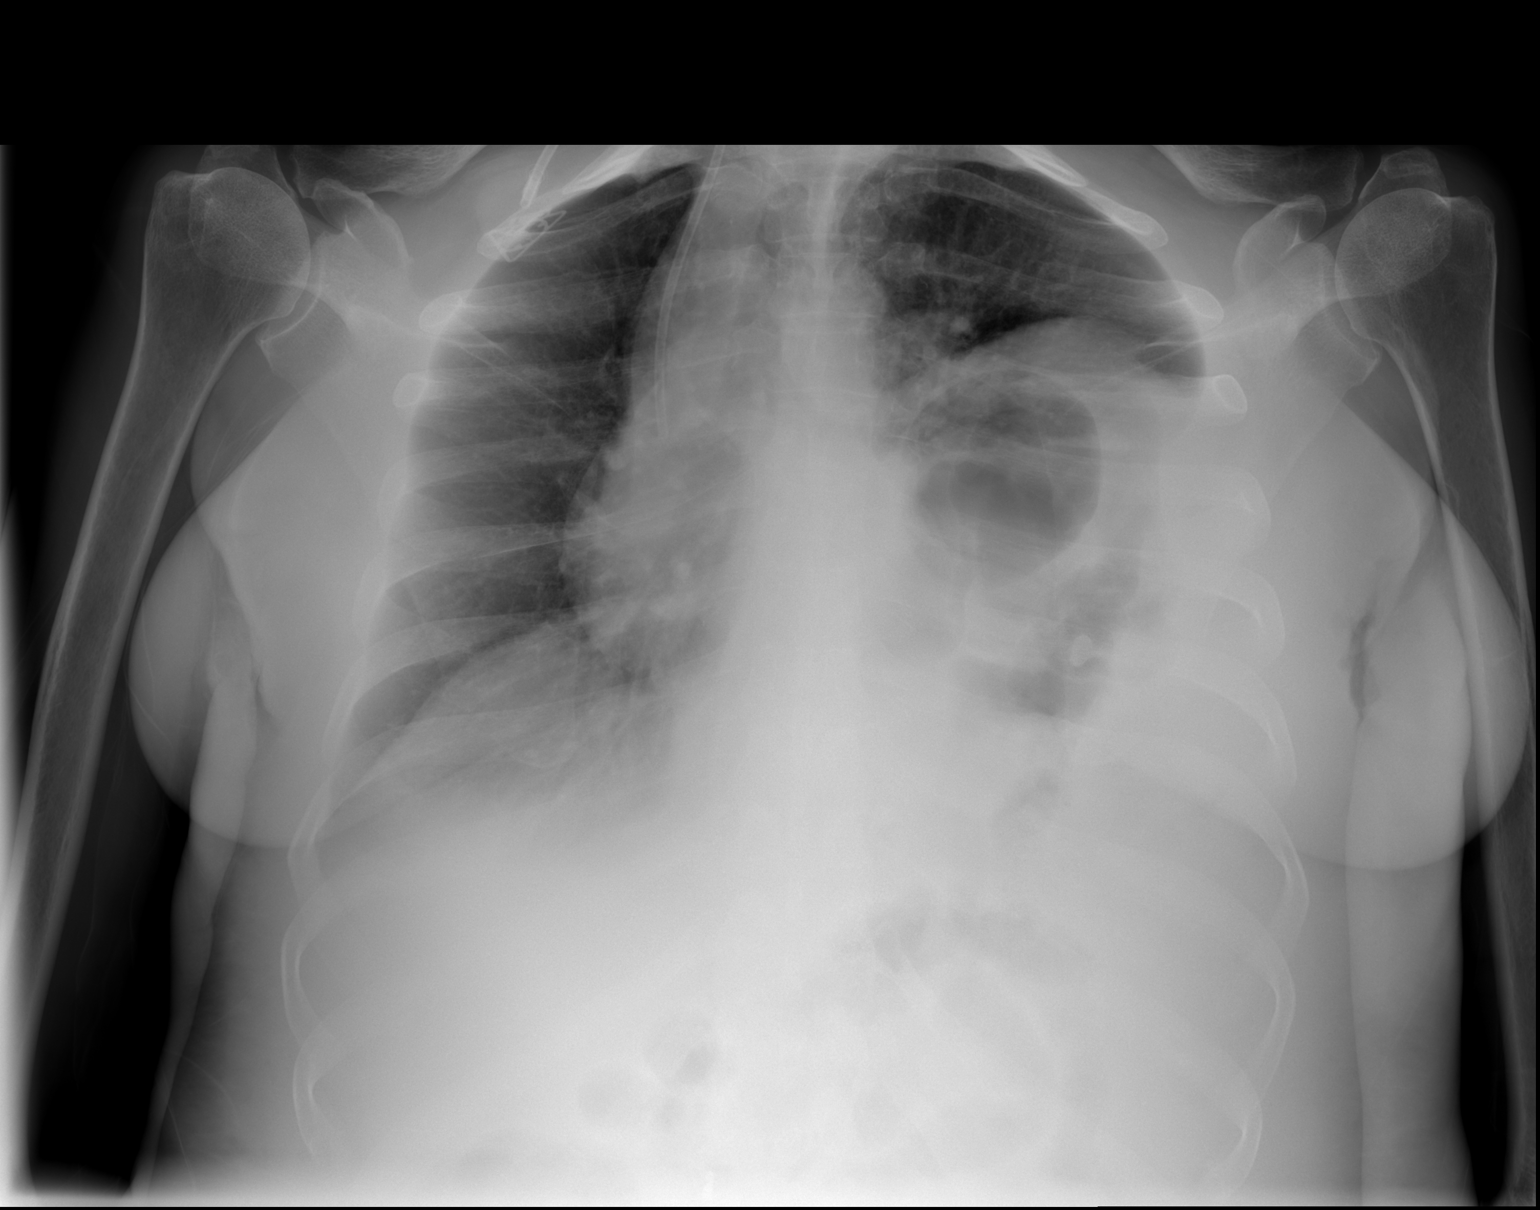

[w chest lat]
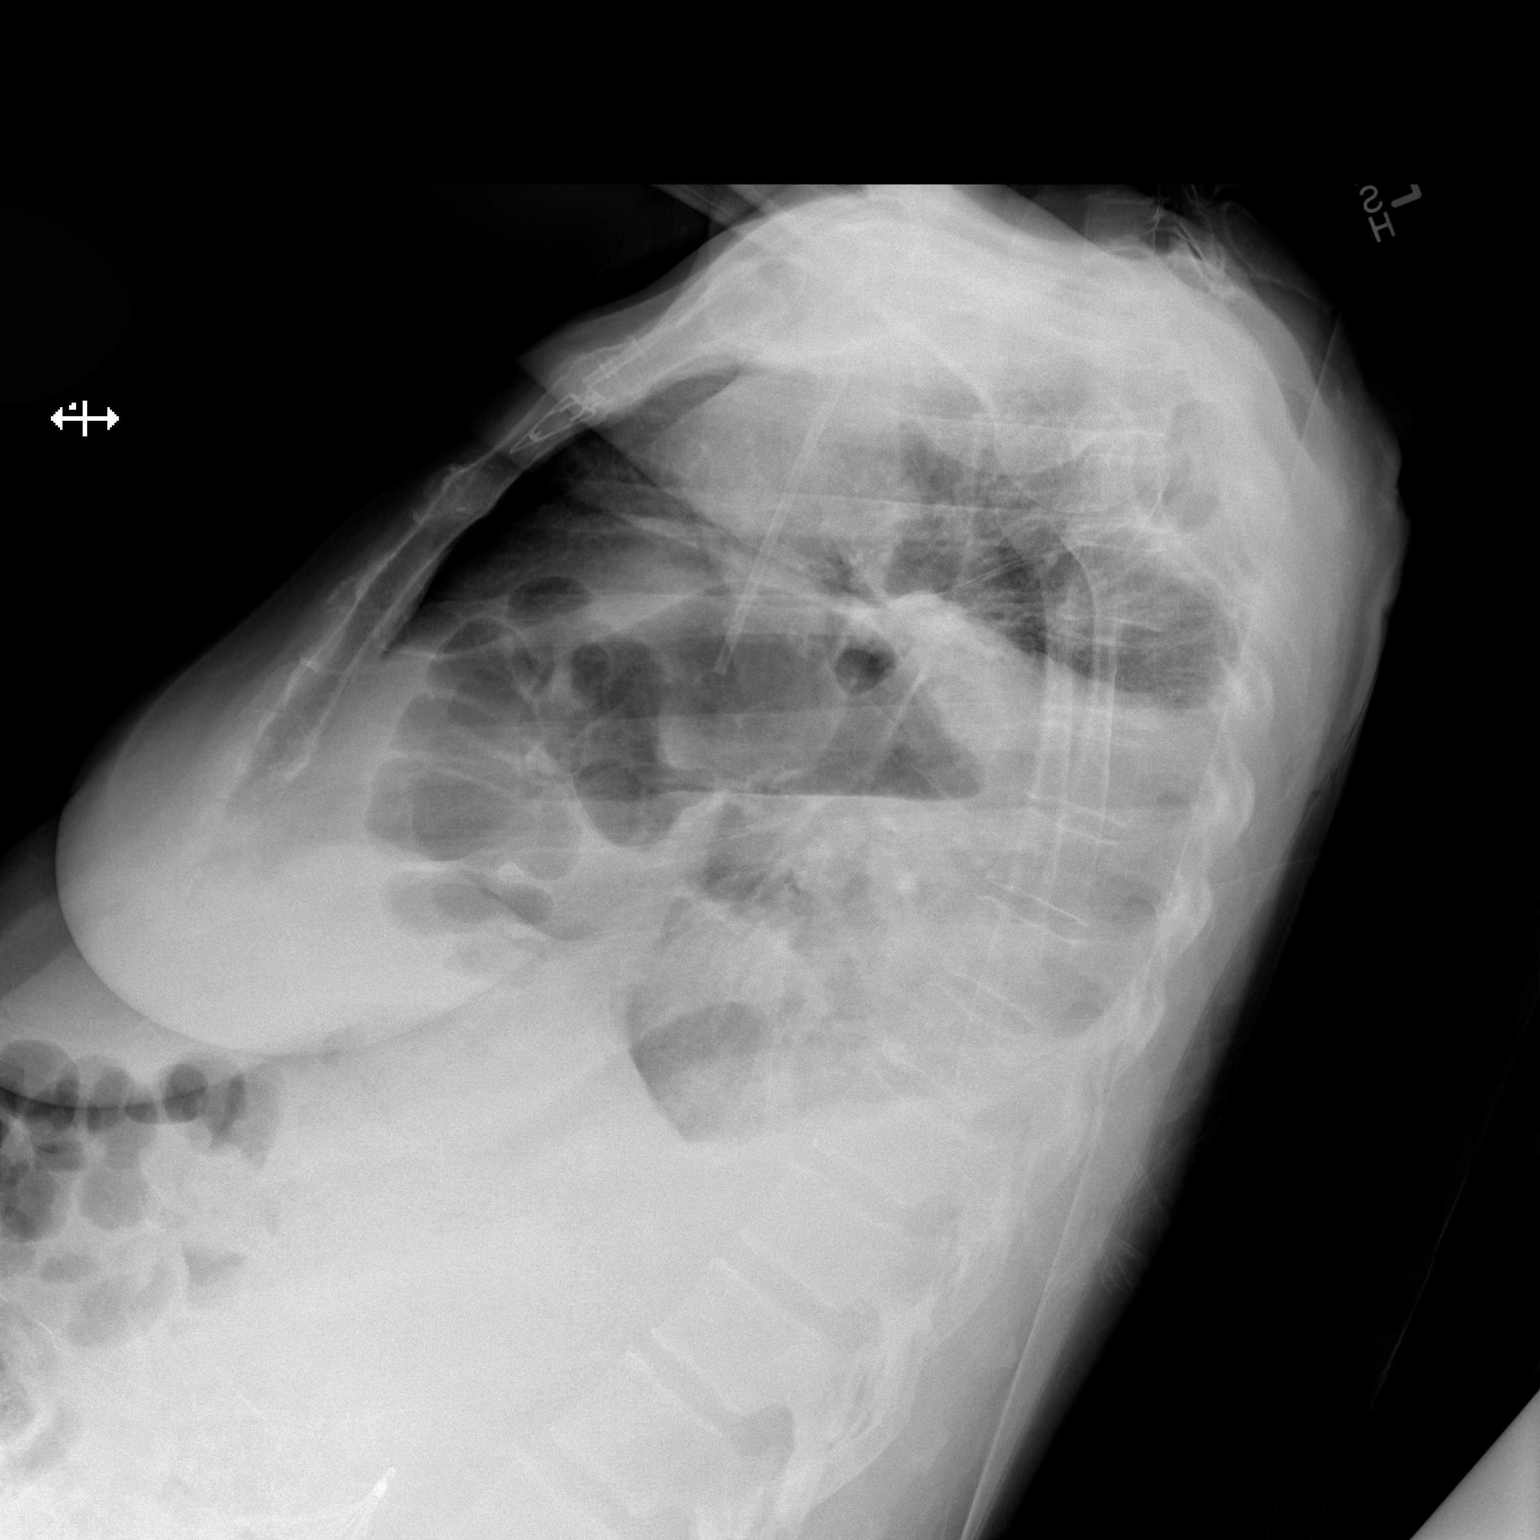

[2 of 2 positions shown; findings below may reference images not displayed]

FINDINGS: Heart size and pulmonary vascularity are normal. Right lung is
clear. Slight compressive atelectasis in the left lung due to large
diaphragmatic hernia. Power port in place, unchanged.

Bones are normal.
IMPRESSION: No acute abnormality. Large diaphragmatic hernia on the left with
secondary compressive atelectasis in the left lung.

## 2018-06-21 IMAGING — DX DG CHEST 1V PORT
1 series · 1 of 1 positions shown · non-contrast
Comparison: Chest x-ray 09/25/2016.

CLINICAL DATA: 65-year-old female with history of deep venous
thrombosis, pulmonary thromboembolism, anemia and thrombocytopenia.
IVC filter placement.

EXAM:
PORTABLE CHEST 1 VIEW

[chest ap]
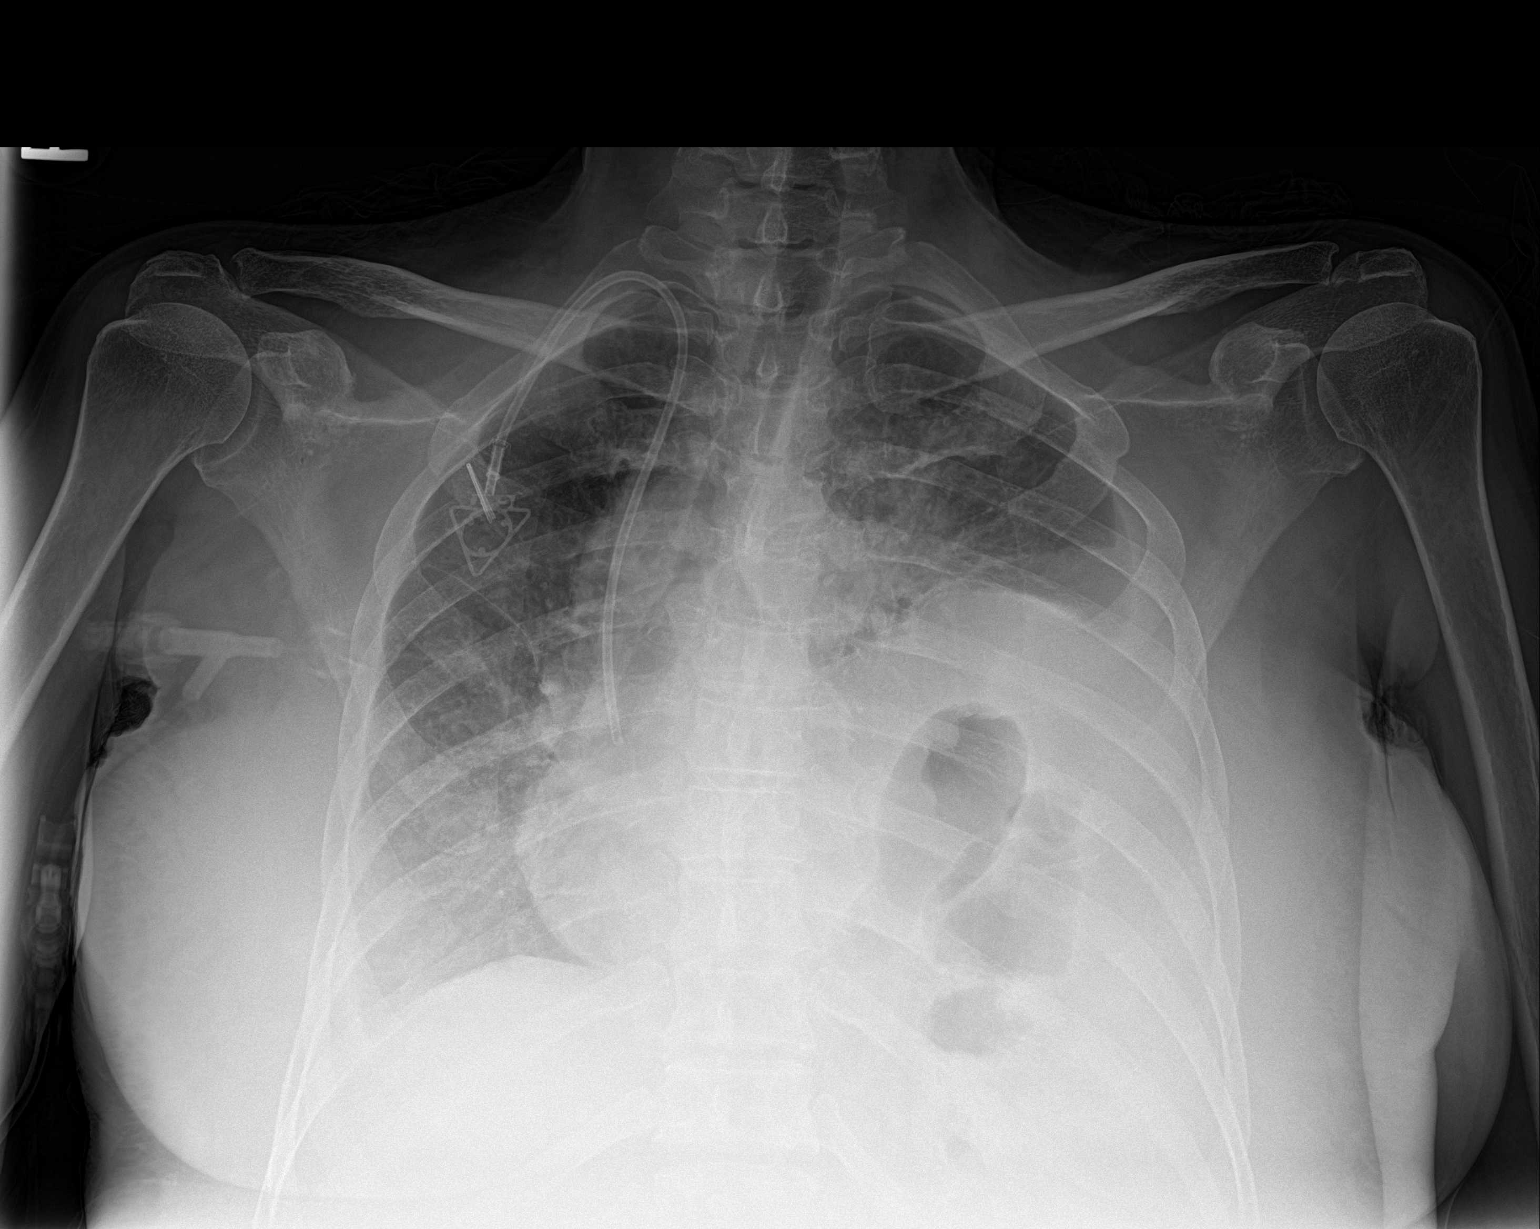

[1 of 1 positions shown; findings below may reference images not displayed]

FINDINGS: Persistent elevation of the left hemidiaphragm. Opacities at the
base of the left hemithorax may reflect underlying atelectasis
and/or airspace consolidation, with superimposed small left pleural
effusion. Right lung appears clear. Small right pleural effusion
also suspected. No definite evidence of pulmonary edema. No
pneumothorax. Heart size appears normal, although partially
obscured. Upper mediastinal contours are distorted by patient
positioning, but there does appear to be right paratracheal soft
tissue fullness concerning for lymphadenopathy. Right internal
jugular single-lumen power porta cath with tip terminating at the
superior cavoatrial junction.
IMPRESSION: 1. Trace right and small left pleural effusions.
2. Left-sided diaphragmatic hernia again noted with probable
atelectasis in the left lung base.
3. Right paratracheal soft tissue fullness concerning for
lymphadenopathy.

## 2018-06-21 IMAGING — DX DG CHEST 1V PORT
1 series · 1 of 1 positions shown · non-contrast
Comparison: 09/28/2016 chest radiograph

CLINICAL DATA: 65 y/o  F; dyspnea.

EXAM:
PORTABLE CHEST 1 VIEW

[chest ap]
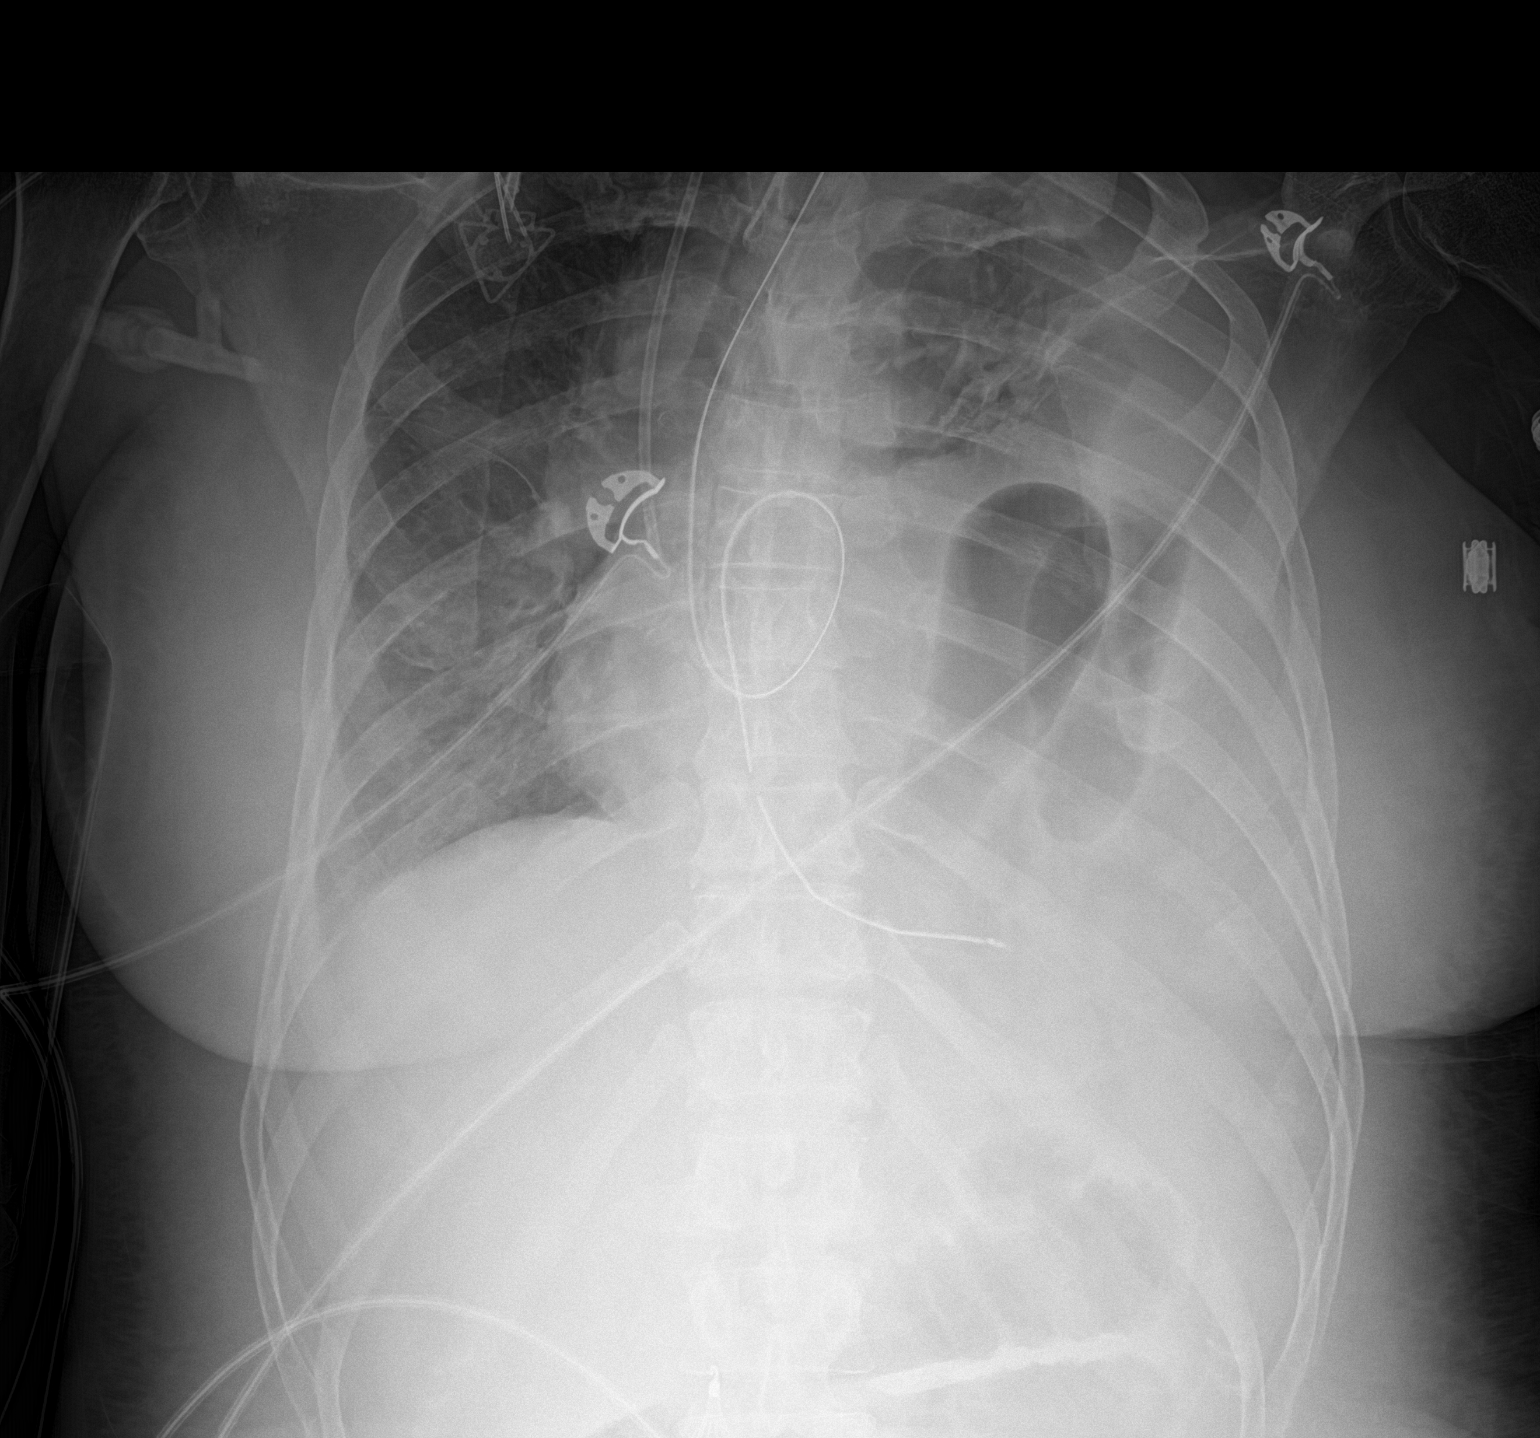

[1 of 1 positions shown; findings below may reference images not displayed]

FINDINGS: Massive left-sided diaphragmatic hernia. Endotracheal tube is 2.7 cm
from carina. Enteric tube coils in the lower esophagus and tip is
probably in the region of the proximal stomach.

Stable cardiac silhouette given projection and technique. Increasing
left-sided pleural effusion and atelectasis of left lung. No acute
osseous abnormality is evident. IVC filter noted. Prominent bowel
loops in upper abdomen partially visualize.
IMPRESSION: 1. Endotracheal tube tip 2.7 cm from carina.
2. Enteric tube coronal zone lower esophagus with tip projecting
over proximal stomach. Consider advancement.
3. Increasing left pleural effusion and left lung atelectasis.

By: Macho Tiger M.D.

## 2018-06-22 IMAGING — DX DG CHEST 1V PORT
1 series · 1 of 1 positions shown · non-contrast
Comparison: September 28, 2016 chest radiograph; chest CT August 28, 2016

CLINICAL DATA: Hypoxia

EXAM:
PORTABLE CHEST 1 VIEW

[chest]
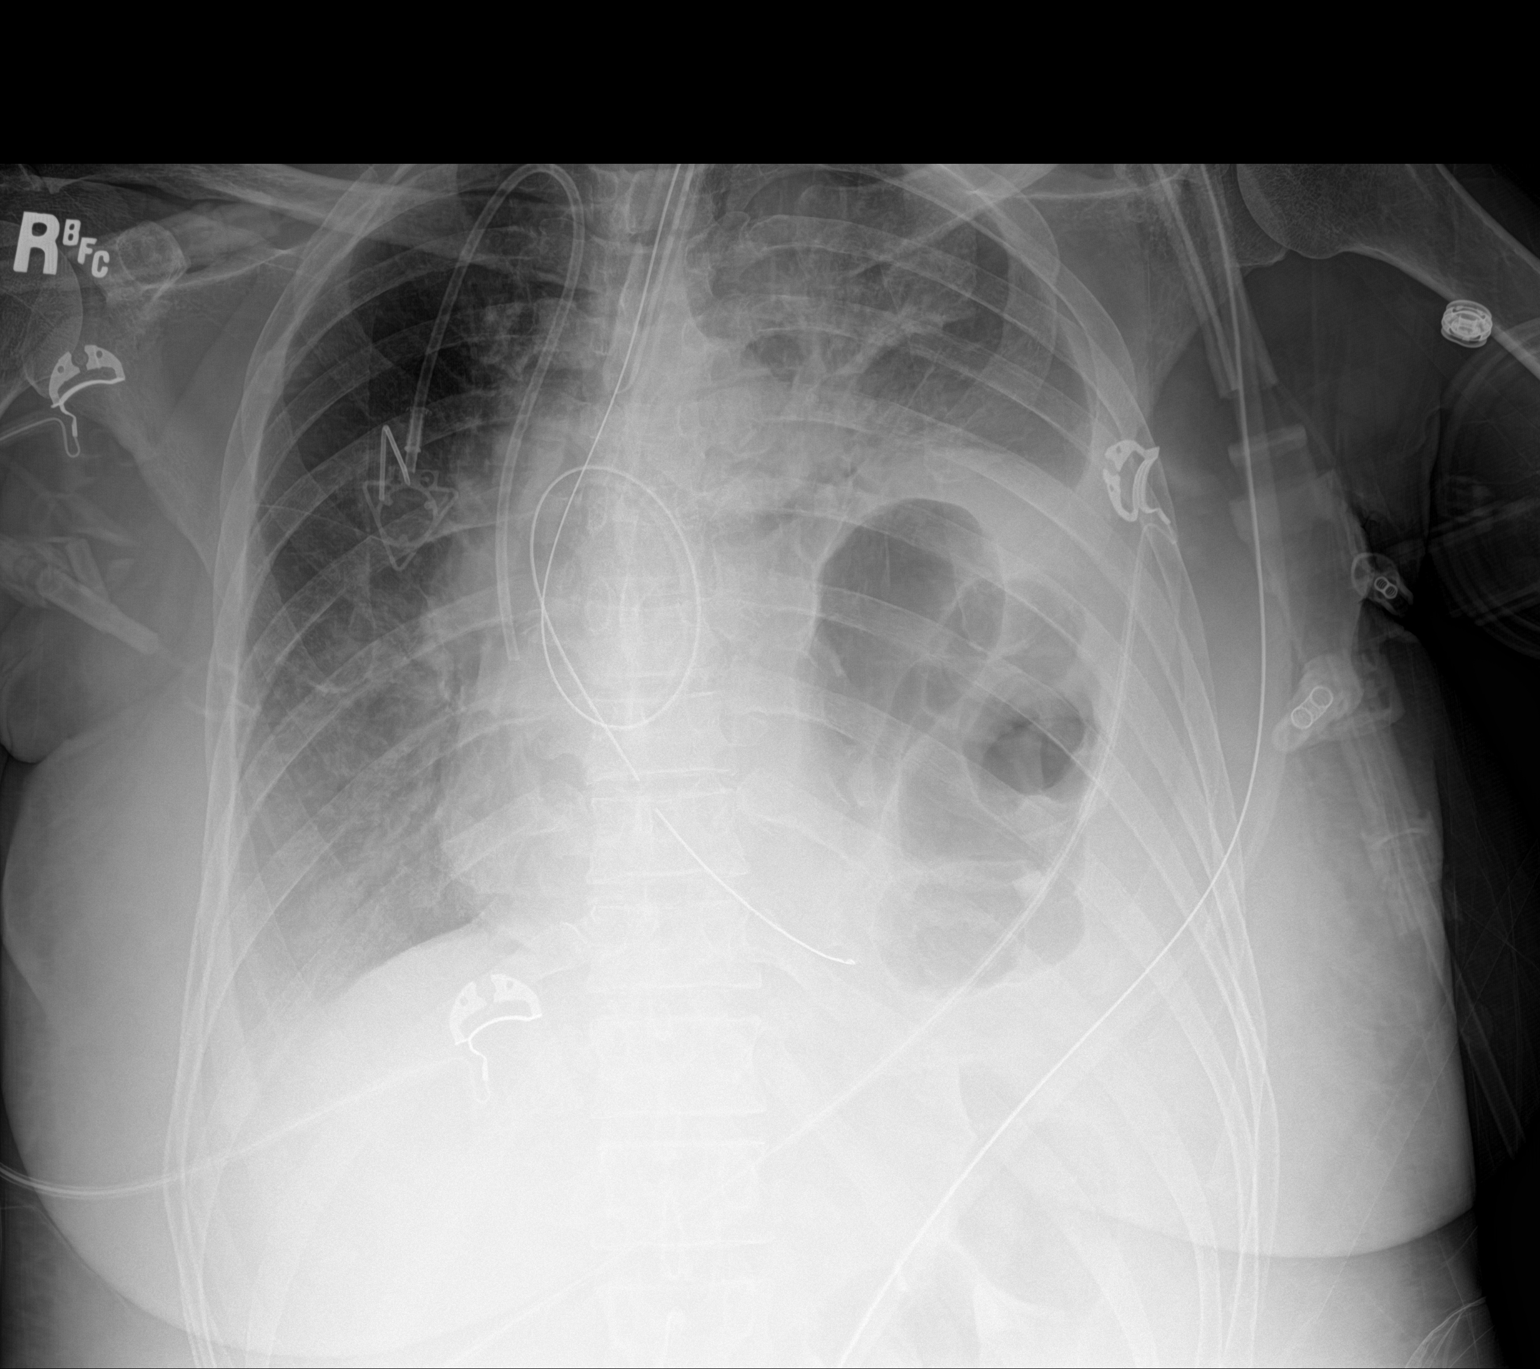

[1 of 1 positions shown; findings below may reference images not displayed]

FINDINGS: Endotracheal tube tip is 2.2 cm above the carina. Nasogastric tube
position is unchanged with a portion of the catheter pulled an a
likely hiatal hernia with the tip at the level of the right
hemidiaphragm, probably in the proximal stomach given a hiatal type
hernia. Port-A-Cath tip is in the superior vena cava. There is no
evident pneumothorax. There is persistent atelectatic change in the
left base with small left pleural effusion. Right lung is clear.
Heart is upper normal in size. The pulmonary vascular is within
normal limits. No evident adenopathy. No bone lesions.
IMPRESSION: Tube and catheter positions as described without pneumothorax. A
porch portion of the nasogastric tube is likely well either within
the distal esophagus for at the junction of the distal esophagus and
hiatal hernia. Tip and side port are likely within hiatal hernia. It
may be prudent to consider withdrawing nasogastric tube in than
advancing to eliminate sequela and advanced the nasogastric tube
more distally into the stomach. The appearance is stable compared to
1 day prior.

There is elevation left hemidiaphragm with left lower lobe region
atelectasis and left pleural effusion. Stable cardiac silhouette.
Right lung clear.
